# Patient Record
Sex: Male | Born: 1945 | Race: Black or African American | Hispanic: No | Marital: Married | Smoking: Former smoker
Health system: Southern US, Community
[De-identification: ages and names within clinical notes are randomized; demographics above are authoritative.]

## PROBLEM LIST (undated history)

## (undated) DIAGNOSIS — K56609 Unspecified intestinal obstruction, unspecified as to partial versus complete obstruction: Secondary | ICD-10-CM

## (undated) DIAGNOSIS — I5022 Chronic systolic (congestive) heart failure: Secondary | ICD-10-CM

## (undated) DIAGNOSIS — E119 Type 2 diabetes mellitus without complications: Secondary | ICD-10-CM

## (undated) DIAGNOSIS — I10 Essential (primary) hypertension: Secondary | ICD-10-CM

## (undated) DIAGNOSIS — I639 Cerebral infarction, unspecified: Secondary | ICD-10-CM

## (undated) DIAGNOSIS — J189 Pneumonia, unspecified organism: Secondary | ICD-10-CM

## (undated) DIAGNOSIS — I69320 Aphasia following cerebral infarction: Secondary | ICD-10-CM

## (undated) DIAGNOSIS — G8190 Hemiplegia, unspecified affecting unspecified side: Secondary | ICD-10-CM

## (undated) DIAGNOSIS — I739 Peripheral vascular disease, unspecified: Secondary | ICD-10-CM

## (undated) HISTORY — PX: PERIPHERAL VASCULAR CATHETERIZATION: SHX172C

---

## 2015-11-11 DIAGNOSIS — K56609 Unspecified intestinal obstruction, unspecified as to partial versus complete obstruction: Secondary | ICD-10-CM

## 2015-11-11 HISTORY — PX: ABDOMINAL ADHESION SURGERY: SHX90

## 2015-11-11 HISTORY — DX: Unspecified intestinal obstruction, unspecified as to partial versus complete obstruction: K56.609

## 2016-10-28 ENCOUNTER — Other Ambulatory Visit (HOSPITAL_COMMUNITY): Payer: Self-pay | Admitting: Internal Medicine

## 2016-10-28 DIAGNOSIS — R1319 Other dysphagia: Secondary | ICD-10-CM

## 2016-11-01 ENCOUNTER — Ambulatory Visit (HOSPITAL_COMMUNITY)
Admission: RE | Admit: 2016-11-01 | Discharge: 2016-11-01 | Disposition: A | Discharge status: Home / Self Care | Payer: Medicare Other | Source: Ambulatory Visit | Attending: Internal Medicine | Admitting: Internal Medicine

## 2016-11-01 DIAGNOSIS — R1312 Dysphagia, oropharyngeal phase: Secondary | ICD-10-CM | POA: Insufficient documentation

## 2016-11-01 DIAGNOSIS — R1319 Other dysphagia: Secondary | ICD-10-CM | POA: Diagnosis present

## 2017-03-22 ENCOUNTER — Emergency Department (HOSPITAL_COMMUNITY): Payer: Medicare Other

## 2017-03-22 ENCOUNTER — Encounter (HOSPITAL_COMMUNITY): Payer: Self-pay

## 2017-03-22 ENCOUNTER — Inpatient Hospital Stay (HOSPITAL_COMMUNITY)
Admission: EM | Admit: 2017-03-22 | Discharge: 2017-03-28 | DRG: 291 | Disposition: A | Discharge status: Skilled Nursing Facility | Payer: Medicare Other | Attending: Internal Medicine | Admitting: Internal Medicine

## 2017-03-22 ENCOUNTER — Other Ambulatory Visit: Payer: Self-pay

## 2017-03-22 ENCOUNTER — Inpatient Hospital Stay (HOSPITAL_COMMUNITY): Payer: Medicare Other

## 2017-03-22 DIAGNOSIS — Z515 Encounter for palliative care: Secondary | ICD-10-CM | POA: Diagnosis not present

## 2017-03-22 DIAGNOSIS — Z7982 Long term (current) use of aspirin: Secondary | ICD-10-CM

## 2017-03-22 DIAGNOSIS — Z931 Gastrostomy status: Secondary | ICD-10-CM | POA: Diagnosis not present

## 2017-03-22 DIAGNOSIS — J9601 Acute respiratory failure with hypoxia: Secondary | ICD-10-CM | POA: Diagnosis not present

## 2017-03-22 DIAGNOSIS — E876 Hypokalemia: Secondary | ICD-10-CM | POA: Diagnosis present

## 2017-03-22 DIAGNOSIS — A419 Sepsis, unspecified organism: Secondary | ICD-10-CM

## 2017-03-22 DIAGNOSIS — E1151 Type 2 diabetes mellitus with diabetic peripheral angiopathy without gangrene: Secondary | ICD-10-CM | POA: Diagnosis present

## 2017-03-22 DIAGNOSIS — R05 Cough: Secondary | ICD-10-CM

## 2017-03-22 DIAGNOSIS — I69359 Hemiplegia and hemiparesis following cerebral infarction affecting unspecified side: Secondary | ICD-10-CM | POA: Diagnosis not present

## 2017-03-22 DIAGNOSIS — K219 Gastro-esophageal reflux disease without esophagitis: Secondary | ICD-10-CM | POA: Diagnosis present

## 2017-03-22 DIAGNOSIS — I472 Ventricular tachycardia: Secondary | ICD-10-CM | POA: Diagnosis not present

## 2017-03-22 DIAGNOSIS — I5043 Acute on chronic combined systolic (congestive) and diastolic (congestive) heart failure: Secondary | ICD-10-CM | POA: Diagnosis present

## 2017-03-22 DIAGNOSIS — I480 Paroxysmal atrial fibrillation: Secondary | ICD-10-CM | POA: Diagnosis present

## 2017-03-22 DIAGNOSIS — Z7189 Other specified counseling: Secondary | ICD-10-CM | POA: Diagnosis not present

## 2017-03-22 DIAGNOSIS — Z79899 Other long term (current) drug therapy: Secondary | ICD-10-CM | POA: Diagnosis not present

## 2017-03-22 DIAGNOSIS — R0602 Shortness of breath: Secondary | ICD-10-CM | POA: Diagnosis not present

## 2017-03-22 DIAGNOSIS — E872 Acidosis: Secondary | ICD-10-CM | POA: Diagnosis present

## 2017-03-22 DIAGNOSIS — I110 Hypertensive heart disease with heart failure: Secondary | ICD-10-CM | POA: Diagnosis not present

## 2017-03-22 DIAGNOSIS — Z9189 Other specified personal risk factors, not elsewhere classified: Secondary | ICD-10-CM | POA: Diagnosis not present

## 2017-03-22 DIAGNOSIS — J690 Pneumonitis due to inhalation of food and vomit: Secondary | ICD-10-CM | POA: Diagnosis present

## 2017-03-22 DIAGNOSIS — I509 Heart failure, unspecified: Secondary | ICD-10-CM

## 2017-03-22 DIAGNOSIS — N289 Disorder of kidney and ureter, unspecified: Secondary | ICD-10-CM

## 2017-03-22 DIAGNOSIS — Z66 Do not resuscitate: Secondary | ICD-10-CM | POA: Diagnosis present

## 2017-03-22 DIAGNOSIS — Z87891 Personal history of nicotine dependence: Secondary | ICD-10-CM

## 2017-03-22 DIAGNOSIS — N179 Acute kidney failure, unspecified: Secondary | ICD-10-CM | POA: Diagnosis present

## 2017-03-22 DIAGNOSIS — I5022 Chronic systolic (congestive) heart failure: Secondary | ICD-10-CM | POA: Diagnosis present

## 2017-03-22 DIAGNOSIS — R7881 Bacteremia: Secondary | ICD-10-CM | POA: Diagnosis present

## 2017-03-22 DIAGNOSIS — R059 Cough, unspecified: Secondary | ICD-10-CM

## 2017-03-22 DIAGNOSIS — R509 Fever, unspecified: Secondary | ICD-10-CM

## 2017-03-22 DIAGNOSIS — I5023 Acute on chronic systolic (congestive) heart failure: Secondary | ICD-10-CM | POA: Diagnosis not present

## 2017-03-22 DIAGNOSIS — I69320 Aphasia following cerebral infarction: Secondary | ICD-10-CM

## 2017-03-22 DIAGNOSIS — I340 Nonrheumatic mitral (valve) insufficiency: Secondary | ICD-10-CM | POA: Diagnosis not present

## 2017-03-22 DIAGNOSIS — Z8701 Personal history of pneumonia (recurrent): Secondary | ICD-10-CM

## 2017-03-22 DIAGNOSIS — B9562 Methicillin resistant Staphylococcus aureus infection as the cause of diseases classified elsewhere: Secondary | ICD-10-CM | POA: Diagnosis present

## 2017-03-22 HISTORY — DX: Unspecified intestinal obstruction, unspecified as to partial versus complete obstruction: K56.609

## 2017-03-22 HISTORY — DX: Essential (primary) hypertension: I10

## 2017-03-22 HISTORY — DX: Hemiplegia, unspecified affecting unspecified side: G81.90

## 2017-03-22 HISTORY — DX: Pneumonia, unspecified organism: J18.9

## 2017-03-22 HISTORY — DX: Chronic systolic (congestive) heart failure: I50.22

## 2017-03-22 HISTORY — DX: Peripheral vascular disease, unspecified: I73.9

## 2017-03-22 HISTORY — DX: Type 2 diabetes mellitus without complications: E11.9

## 2017-03-22 HISTORY — DX: Aphasia following cerebral infarction: I69.320

## 2017-03-22 LAB — CBC WITH DIFFERENTIAL/PLATELET
BASOS PCT: 0 %
Basophils Absolute: 0 10*3/uL (ref 0.0–0.1)
EOS PCT: 1 %
Eosinophils Absolute: 0.1 10*3/uL (ref 0.0–0.7)
HEMATOCRIT: 42.3 % (ref 39.0–52.0)
Hemoglobin: 13.7 g/dL (ref 13.0–17.0)
Lymphocytes Relative: 26 %
Lymphs Abs: 2.1 10*3/uL (ref 0.7–4.0)
MCH: 24.8 pg — ABNORMAL LOW (ref 26.0–34.0)
MCHC: 32.4 g/dL (ref 30.0–36.0)
MCV: 76.6 fL — AB (ref 78.0–100.0)
MONO ABS: 0.7 10*3/uL (ref 0.1–1.0)
MONOS PCT: 9 %
NEUTROS ABS: 5.1 10*3/uL (ref 1.7–7.7)
Neutrophils Relative %: 64 %
Platelets: 432 10*3/uL — ABNORMAL HIGH (ref 150–400)
RBC: 5.52 MIL/uL (ref 4.22–5.81)
RDW: 16.4 % — AB (ref 11.5–15.5)
WBC: 7.9 10*3/uL (ref 4.0–10.5)

## 2017-03-22 LAB — COMPREHENSIVE METABOLIC PANEL
ALBUMIN: 3 g/dL — AB (ref 3.5–5.0)
ALK PHOS: 113 U/L (ref 38–126)
ALT: 15 U/L — ABNORMAL LOW (ref 17–63)
AST: 21 U/L (ref 15–41)
Anion gap: 14 (ref 5–15)
BILIRUBIN TOTAL: 0.9 mg/dL (ref 0.3–1.2)
BUN: 9 mg/dL (ref 6–20)
CO2: 20 mmol/L — ABNORMAL LOW (ref 22–32)
Calcium: 10.1 mg/dL (ref 8.9–10.3)
Chloride: 107 mmol/L (ref 101–111)
Creatinine, Ser: 1.23 mg/dL (ref 0.61–1.24)
GFR calc Af Amer: >60 mL/min (ref >60)
GFR calc non Af Amer: 58 mL/min — ABNORMAL LOW (ref >60)
GLUCOSE: 124 mg/dL — AB (ref 65–99)
POTASSIUM: 3.9 mmol/L (ref 3.5–5.1)
Sodium: 141 mmol/L (ref 135–145)
TOTAL PROTEIN: 6.6 g/dL (ref 6.5–8.1)

## 2017-03-22 LAB — LACTIC ACID, PLASMA
LACTIC ACID, VENOUS: 3.7 mmol/L — AB (ref 0.5–1.9)
LACTIC ACID, VENOUS: 3.4 mmol/L — AB (ref 0.5–1.9)
LACTIC ACID, VENOUS: 2.6 mmol/L — AB (ref 0.5–1.9)
Lactic Acid, Venous: 2.2 mmol/L (ref 0.5–1.9)
Lactic Acid, Venous: 3.7 mmol/L (ref 0.5–1.9)
Lactic Acid, Venous: 1.9 mmol/L (ref 0.5–1.9)

## 2017-03-22 LAB — URINALYSIS, ROUTINE W REFLEX MICROSCOPIC
BILIRUBIN URINE: NEGATIVE
Bacteria, UA: NONE SEEN
Glucose, UA: NEGATIVE mg/dL
HGB URINE DIPSTICK: NEGATIVE
KETONES UR: 5 mg/dL — AB
Leukocytes, UA: NEGATIVE
Nitrite: NEGATIVE
PROTEIN: 100 mg/dL — AB
SQUAMOUS EPITHELIAL / LPF: NONE SEEN
Specific Gravity, Urine: 1.014 (ref 1.005–1.030)
pH: 6 (ref 5.0–8.0)

## 2017-03-22 LAB — INFLUENZA PANEL BY PCR (TYPE A & B)
INFLAPCR: NEGATIVE
Influenza B By PCR: NEGATIVE

## 2017-03-22 LAB — I-STAT TROPONIN, ED: TROPONIN I, POC: 0.03 ng/mL (ref 0.00–0.08)

## 2017-03-22 LAB — STREP PNEUMONIAE URINARY ANTIGEN: Strep Pneumo Urinary Antigen: NEGATIVE

## 2017-03-22 LAB — BRAIN NATRIURETIC PEPTIDE: B Natriuretic Peptide: 1180.9 pg/mL — ABNORMAL HIGH (ref 0.0–100.0)

## 2017-03-22 LAB — I-STAT CG4 LACTIC ACID, ED
Lactic Acid, Venous: 2.34 mmol/L (ref 0.5–1.9)
Lactic Acid, Venous: 2.33 mmol/L (ref 0.5–1.9)

## 2017-03-22 LAB — MRSA PCR SCREENING: MRSA BY PCR: POSITIVE — AB

## 2017-03-22 MED ORDER — CALCIUM CARBONATE-VITAMIN D 500-200 MG-UNIT PO TABS
1.0000 | ORAL_TABLET | Freq: Every day | ORAL | Status: DC
  Administered 2017-03-22 – 2017-03-28 (×7): 1
  Filled 2017-03-22 (×8): qty 1

## 2017-03-22 MED ORDER — ORAL CARE MOUTH RINSE
15.0000 mL | Freq: Two times a day (BID) | OROMUCOSAL | Status: DC
  Administered 2017-03-22 – 2017-03-27 (×12): 15 mL via OROMUCOSAL

## 2017-03-22 MED ORDER — METOPROLOL TARTRATE 12.5 MG HALF TABLET
12.5000 mg | ORAL_TABLET | Freq: Two times a day (BID) | ORAL | Status: DC
  Administered 2017-03-22 – 2017-03-27 (×10): 12.5 mg
  Filled 2017-03-22 (×11): qty 1

## 2017-03-22 MED ORDER — VANCOMYCIN HCL IN DEXTROSE 1-5 GM/200ML-% IV SOLN
1000.0000 mg | Freq: Once | INTRAVENOUS | Status: DC
  Filled 2017-03-22: qty 200

## 2017-03-22 MED ORDER — ENOXAPARIN SODIUM 40 MG/0.4ML ~~LOC~~ SOLN
40.0000 mg | SUBCUTANEOUS | Status: DC
  Administered 2017-03-22 – 2017-03-27 (×6): 40 mg via SUBCUTANEOUS
  Filled 2017-03-22 (×7): qty 0.4

## 2017-03-22 MED ORDER — ALPRAZOLAM 0.25 MG PO TABS: 0.2500 mg | ORAL_TABLET | Freq: Two times a day (BID) | ORAL | Status: DC | PRN

## 2017-03-22 MED ORDER — ACETAMINOPHEN 325 MG PO TABS
650.0000 mg | ORAL_TABLET | ORAL | Status: DC | PRN
Start: 2017-03-22 — End: 2017-03-28

## 2017-03-22 MED ORDER — SODIUM CHLORIDE 0.9 % IV BOLUS (SEPSIS)
1000.0000 mL | Freq: Once | INTRAVENOUS | Status: AC
  Administered 2017-03-22: 1000 mL via INTRAVENOUS

## 2017-03-22 MED ORDER — MAGNESIUM OXIDE 400 (241.3 MG) MG PO TABS
400.0000 mg | ORAL_TABLET | Freq: Two times a day (BID) | ORAL | Status: DC
  Administered 2017-03-22 – 2017-03-28 (×13): 400 mg
  Filled 2017-03-22 (×13): qty 1

## 2017-03-22 MED ORDER — SODIUM CHLORIDE 0.9 % IV SOLN
2.0000 g | Freq: Once | INTRAVENOUS | Status: AC
  Administered 2017-03-22: 2 g via INTRAVENOUS
  Filled 2017-03-22: qty 2

## 2017-03-22 MED ORDER — MUSCLE RUB 10-15 % EX CREA
TOPICAL_CREAM | CUTANEOUS | Status: DC | PRN
  Filled 2017-03-22: qty 85

## 2017-03-22 MED ORDER — SODIUM CHLORIDE 0.9 % IV SOLN: 250.0000 mL | INTRAVENOUS | Status: DC | PRN

## 2017-03-22 MED ORDER — MENTHOL (TOPICAL ANALGESIC) 4 % EX GEL: Freq: Two times a day (BID) | CUTANEOUS | Status: DC

## 2017-03-22 MED ORDER — ZOLPIDEM TARTRATE 5 MG PO TABS: 5.0000 mg | ORAL_TABLET | Freq: Every evening | ORAL | Status: DC | PRN

## 2017-03-22 MED ORDER — VANCOMYCIN HCL IN DEXTROSE 750-5 MG/150ML-% IV SOLN
750.0000 mg | Freq: Two times a day (BID) | INTRAVENOUS | Status: DC
  Administered 2017-03-23 (×2): 750 mg via INTRAVENOUS
  Filled 2017-03-22 (×3): qty 150

## 2017-03-22 MED ORDER — CEFEPIME HCL 1 G IJ SOLR
1.0000 g | Freq: Three times a day (TID) | INTRAMUSCULAR | Status: DC
  Filled 2017-03-22: qty 1

## 2017-03-22 MED ORDER — ONDANSETRON HCL 4 MG/2ML IJ SOLN: 4.0000 mg | Freq: Four times a day (QID) | INTRAMUSCULAR | Status: DC | PRN

## 2017-03-22 MED ORDER — ALBUTEROL SULFATE (2.5 MG/3ML) 0.083% IN NEBU
5.0000 mg | INHALATION_SOLUTION | Freq: Once | RESPIRATORY_TRACT | Status: AC
Start: 2017-03-22 — End: 2017-03-22
  Administered 2017-03-22: 5 mg via RESPIRATORY_TRACT
  Filled 2017-03-22: qty 6

## 2017-03-22 MED ORDER — CHLORHEXIDINE GLUCONATE CLOTH 2 % EX PADS
6.0000 | MEDICATED_PAD | Freq: Every day | CUTANEOUS | Status: AC
  Administered 2017-03-23 – 2017-03-27 (×5): 6 via TOPICAL

## 2017-03-22 MED ORDER — SODIUM CHLORIDE 0.9 % IV BOLUS (SEPSIS)
250.0000 mL | Freq: Once | INTRAVENOUS | Status: AC
  Administered 2017-03-22: 250 mL via INTRAVENOUS

## 2017-03-22 MED ORDER — VANCOMYCIN HCL IN DEXTROSE 750-5 MG/150ML-% IV SOLN: 750.0000 mg | Freq: Two times a day (BID) | INTRAVENOUS | Status: DC

## 2017-03-22 MED ORDER — ADULT MULTIVITAMIN W/MINERALS CH
1.0000 | ORAL_TABLET | Freq: Every day | ORAL | Status: DC
  Administered 2017-03-22 – 2017-03-28 (×7): 1
  Filled 2017-03-22 (×7): qty 1

## 2017-03-22 MED ORDER — SODIUM CHLORIDE 0.9 % IV BOLUS (SEPSIS): 1000.0000 mL | Freq: Once | INTRAVENOUS | Status: DC

## 2017-03-22 MED ORDER — SODIUM CHLORIDE 0.9% FLUSH
3.0000 mL | Freq: Two times a day (BID) | INTRAVENOUS | Status: DC
  Administered 2017-03-22 – 2017-03-27 (×9): 3 mL via INTRAVENOUS

## 2017-03-22 MED ORDER — FUROSEMIDE 10 MG/ML IJ SOLN: 40.0000 mg | Freq: Every day | INTRAMUSCULAR | Status: DC

## 2017-03-22 MED ORDER — VANCOMYCIN HCL 10 G IV SOLR
1500.0000 mg | Freq: Once | INTRAVENOUS | Status: AC
  Administered 2017-03-22: 1500 mg via INTRAVENOUS
  Filled 2017-03-22: qty 1500

## 2017-03-22 MED ORDER — VITAMIN C 500 MG PO TABS
500.0000 mg | ORAL_TABLET | Freq: Every day | ORAL | Status: DC
  Administered 2017-03-22 – 2017-03-28 (×7): 500 mg via ORAL
  Filled 2017-03-22 (×7): qty 1

## 2017-03-22 MED ORDER — MUPIROCIN 2 % EX OINT
1.0000 "application " | TOPICAL_OINTMENT | Freq: Two times a day (BID) | CUTANEOUS | Status: AC
  Administered 2017-03-22 – 2017-03-27 (×9): 1 via NASAL
  Filled 2017-03-22 (×2): qty 22

## 2017-03-22 MED ORDER — ATORVASTATIN CALCIUM 40 MG PO TABS
40.0000 mg | ORAL_TABLET | Freq: Every day | ORAL | Status: DC
  Administered 2017-03-22 – 2017-03-27 (×6): 40 mg
  Filled 2017-03-22 (×7): qty 1

## 2017-03-22 MED ORDER — LOSARTAN POTASSIUM 25 MG PO TABS: 25.0000 mg | ORAL_TABLET | Freq: Every day | ORAL | Status: DC

## 2017-03-22 MED ORDER — SODIUM CHLORIDE 0.9% FLUSH: 3.0000 mL | INTRAVENOUS | Status: DC | PRN

## 2017-03-22 MED ORDER — FAMOTIDINE 20 MG PO TABS
20.0000 mg | ORAL_TABLET | Freq: Every day | ORAL | Status: DC
  Administered 2017-03-22 – 2017-03-28 (×7): 20 mg
  Filled 2017-03-22 (×7): qty 1

## 2017-03-22 MED ORDER — GABAPENTIN 300 MG PO CAPS
300.0000 mg | ORAL_CAPSULE | Freq: Every day | ORAL | Status: DC
  Administered 2017-03-22 – 2017-03-27 (×6): 300 mg
  Filled 2017-03-22 (×6): qty 1

## 2017-03-22 MED ORDER — SODIUM CHLORIDE 0.9 % IV SOLN
3.0000 g | Freq: Three times a day (TID) | INTRAVENOUS | Status: DC
  Administered 2017-03-22 – 2017-03-28 (×19): 3 g via INTRAVENOUS
  Filled 2017-03-22 (×22): qty 3

## 2017-03-22 MED ORDER — AMIODARONE HCL 200 MG PO TABS
200.0000 mg | ORAL_TABLET | Freq: Every day | ORAL | Status: DC
  Administered 2017-03-22 – 2017-03-28 (×7): 200 mg
  Filled 2017-03-22 (×7): qty 1

## 2017-03-22 MED ORDER — FUROSEMIDE 10 MG/ML IJ SOLN
40.0000 mg | Freq: Two times a day (BID) | INTRAMUSCULAR | Status: DC
Start: 2017-03-22 — End: 2017-03-22
  Administered 2017-03-22: 40 mg via INTRAVENOUS
  Filled 2017-03-22: qty 4

## 2017-03-22 MED ORDER — ASPIRIN 81 MG PO CHEW
81.0000 mg | CHEWABLE_TABLET | Freq: Every day | ORAL | Status: DC
  Administered 2017-03-22 – 2017-03-28 (×7): 81 mg
  Filled 2017-03-22 (×7): qty 1

## 2017-03-22 NOTE — H&P (Signed)
History and Physical    [name] [name] CXK:[id number] DOB: [birth year] DOA: 03/22/2017  PCP: Garwin Brothers, MD   Patient coming from: SNF  Chief Complaint: SOB   HPI: [name] [name] is a 72 y.o. male with medical history significant for chronic systolic CHF, hypertension, paroxysmal atrial fibrillation, and history of CVA with hemiplegia, now presenting from his SNF for evaluation of respiratory distress.  Patient reports that he developed shortness of breath beginning 2 weeks ago and has progressively worsened since that time.  He reports an occasional cough, sometimes productive of scant thick sputum.  He reportedly had a fever at the nursing facility and was treated with Tylenol prior to transfer to the ED.  He denies chest pain or palpitations.  Upon EMS arrival, patient was reportedly saturating 86% on 4 L/min of supplemental oxygen.  He does not require supplemental oxygen at baseline.  ED Course: Upon arrival to the ED, patient is found to be afebrile, saturating mid 90s with 6 L/min of supplemental oxygen, and with vitals otherwise normal.  EKG features a sinus rhythm with PAC and nonspecific IVCD.  Chest x-ray is notable for moderate cardiomegaly with diffuse hazy opacities throughout bilateral lungs and medium pleural effusions consistent with CHF, but with pneumonia difficult to exclude.  Chemistry panel reveals a creatinine of 1.23, slightly up from priors.  CBC is notable for a stable chronic thrombocytosis with platelets 432,000.  Lactic acid is elevated to 2.34, troponin is within normal limits, and BNP is 1180.  Blood cultures were collected, 30 cc/kg normal saline was given as a bolus, and the patient was started on empiric vancomycin and cefepime.  Fluids and antibiotics were started prior to BNP and chest x-ray becoming available, and the fluid was stopped once these studies were reviewed.  Patient is in some continued respiratory distress hemodynamically stable, and will be admitted to  the stepdown unit for ongoing evaluation and management of acute hypoxic respiratory failure suspected secondary to acute on chronic systolic CHF.  Review of Systems:  All other systems reviewed and apart from HPI, are negative.  Past Medical History:  Diagnosis Date  . CVA, old, aphasia   . Hemiplegia ([place])   . Intestinal obstruction ([place])   . Peripheral vascular disease ([place])   . Pneumonia     Past Surgical History:  Procedure Laterality Date  . ABDOMINAL SURGERY       reports that he has quit smoking. He does not have any smokeless tobacco history on file. He reports that he does not drink alcohol or use drugs.  No Known Allergies  History reviewed. No pertinent family history.   Prior to Admission medications   Medication Sig Start Date End Date Taking? Authorizing Provider  acetaminophen (TYLENOL) 325 MG tablet Place 650 mg into feeding tube every 6 (six) hours as needed for mild pain.   Yes [provider]  amiodarone (PACERONE) 200 MG tablet Place 200 mg into feeding tube daily.   Yes [provider]  aspirin 81 MG chewable tablet Place 81 mg into feeding tube daily.   Yes [provider]  atorvastatin (LIPITOR) 40 MG tablet Place 40 mg into feeding tube daily.   Yes [provider]  calcium-vitamin D (OSCAL WITH D) 500-200 MG-UNIT tablet Place 1 tablet into feeding tube daily with breakfast.   Yes [provider]  famotidine (PEPCID) 20 MG tablet Place 20 mg into feeding tube daily.   Yes [provider]  ferrous sulfate 300 (60 Fe)  MG/5ML syrup Place 300 mg into feeding tube 2 (two) times daily with a meal.   Yes [provider]  furosemide (LASIX) 20 MG tablet Place 20 mg into feeding tube daily.   Yes [provider]  gabapentin (NEURONTIN) 300 MG capsule Place 300 mg into feeding tube at bedtime.   Yes [provider]  losartan (COZAAR) 25 MG tablet Place 25 mg into feeding tube daily.  Hold if SBP <120, or DBP <70   Yes [provider]  magnesium oxide (MAG-OX) 400 MG tablet Place 400 mg into feeding tube 2 (two) times daily.   Yes [provider]  Menthol, Topical Analgesic, (BIOFREEZE) 4 % GEL Apply topically 2 (two) times daily. To left knee   Yes [provider]  metoprolol tartrate (LOPRESSOR) 25 MG tablet Place 12.5 mg into feeding tube 2 (two) times daily.   Yes [provider]  Multiple Vitamin (MULTIVITAMIN WITH MINERALS) TABS tablet Place 1 tablet into feeding tube daily.   Yes [provider]  vitamin C (ASCORBIC ACID) 500 MG tablet Take 500 mg by mouth daily.   Yes [provider]  Vitamin D, Ergocalciferol, (DRISDOL) 50000 units CAPS capsule Take 50,000 Units by mouth every 30 (thirty) days.   Yes [provider]    Physical Exam: Vitals:   03/22/17 0236 03/22/17 0330 03/22/17 0430 03/22/17 0434  BP:  131/87 (!) 147/76   Pulse:  81 86   Resp:  17 (!) 27   Temp:    97.6 F (36.4 C)  TempSrc:    Oral  SpO2:  96% 94%   Weight: 84.8 kg (187 lb)     Height: 5\' 11"  (1.803 m)         Constitutional: Tachypneic, dyspneic with speech, no pallor   Eyes: PERTLA, lids and conjunctivae normal ENMT: Mucous membranes are moist. Posterior pharynx clear of any exudate or lesions.   Neck: normal, supple, no masses, no thyromegaly Respiratory: Tachypneic, dyspneic with speech, accessory muscle recruitment, diffuse rales. No pallor or cyanosis.  Cardiovascular: S1 & S2 heard, regular rate and rhythm. No extremity edema.  Abdomen: No distension, no tenderness, no masses palpated. Bowel sounds normal.  Musculoskeletal: no clubbing / cyanosis. No joint deformity upper and lower extremities.   Skin: no significant rashes, lesions, ulcers. Warm, dry, well-perfused. Neurologic: Dysarthric. Right-sided hemiplegia.   Psychiatric:  Alert and oriented x 3. Pleasant and cooperative.     Labs on Admission: I have  personally reviewed following labs and imaging studies  CBC: Recent Labs  Lab 03/22/17 0252  WBC 7.9  NEUTROABS 5.1  HGB 13.7  HCT 42.3  MCV 76.6*  PLT 831*   Basic Metabolic Panel: Recent Labs  Lab 03/22/17 0252  NA 141  K 3.9  CL 107  CO2 20*  GLUCOSE 124*  BUN 9  CREATININE 1.23  CALCIUM 10.1   GFR: Estimated Creatinine Clearance: 59.5 mL/min (by C-G formula based on SCr of 1.23 mg/dL). Liver Function Tests: Recent Labs  Lab 03/22/17 0252  AST 21  ALT 15*  ALKPHOS 113  BILITOT 0.9  PROT 6.6  ALBUMIN 3.0*   No results for input(s): LIPASE, AMYLASE in the last 168 hours. No results for input(s): AMMONIA in the last 168 hours. Coagulation Profile: No results for input(s): INR, PROTIME in the last 168 hours. Cardiac Enzymes: No results for input(s): CKTOTAL, CKMB, CKMBINDEX, TROPONINI in the last 168 hours. BNP (last 3 results) No results for input(s): PROBNP in  the last 8760 hours. HbA1C: No results for input(s): HGBA1C in the last 72 hours. CBG: No results for input(s): GLUCAP in the last 168 hours. Lipid Profile: No results for input(s): CHOL, HDL, LDLCALC, TRIG, CHOLHDL, LDLDIRECT in the last 72 hours. Thyroid Function Tests: No results for input(s): TSH, T4TOTAL, FREET4, T3FREE, THYROIDAB in the last 72 hours. Anemia Panel: No results for input(s): VITAMINB12, FOLATE, FERRITIN, TIBC, IRON, RETICCTPCT in the last 72 hours. Urine analysis:    Component Value Date/Time   COLORURINE YELLOW 03/22/2017 0410   APPEARANCEUR CLEAR 03/22/2017 0410   LABSPEC 1.014 03/22/2017 0410   PHURINE 6.0 03/22/2017 0410   GLUCOSEU NEGATIVE 03/22/2017 0410   HGBUR NEGATIVE 03/22/2017 0410   BILIRUBINUR NEGATIVE 03/22/2017 0410   KETONESUR 5 (A) 03/22/2017 0410   PROTEINUR 100 (A) 03/22/2017 0410   NITRITE NEGATIVE 03/22/2017 0410   LEUKOCYTESUR NEGATIVE 03/22/2017 0410   Sepsis Labs: @LABRCNTIP (procalcitonin:4,lacticidven:4) )No results found for this or any  previous visit (from the past 240 hour(s)).   Radiological Exams on Admission: Dg Chest 1 View  Result Date: 03/22/2017 CLINICAL DATA:  Shortness of breath EXAM: CHEST  1 VIEW COMPARISON:  Chest radiograph 12/20/2015 FINDINGS: Bilateral pleural effusions with lateral components. Moderate cardiomegaly. Diffuse hazy opacity throughout both lungs. IMPRESSION: Moderate cardiomegaly with diffuse hazy opacities throughout both lungs and medium-sized bilateral pleural effusions. This may indicate congestive heart failure. Pneumonia would be difficult to exclude. Electronically Signed   By: Ulyses Jarred M.D.   On: 03/22/2017 03:26    EKG: Independently reviewed. Sinus rhythm, PAC, non-specific IVCD.   Assessment/Plan  1. Acute hypoxic respiratory failure  - Presents from SNF with 2 weeks of progressive SOB and cough occasionally productive of scant sputum  - Reportedly had fever at SNF, afebrile here  - No leukocytosis, lactate 2.34, BNP 1180, CXR findings most-consistent with pulmonary edema but difficult to exclude PNA  - He was started down sepsis pathway with 30 cc/kg NS and broad spectrum abx prior to CXR and BNP  - Suspect this is primarily acute CHF, but with reported fever there may be an infectious process as well  - Blood cultures have been collected, will add sputum culture and check procalcitonin - Has received doses of vancomycin and cefepime in ED, will hold further abx for now while following cultures and pending procalcitonin - Treat suspected acute CHF as below   2. Acute on chronic systolic CHF  - Presents with acute respiratory distress, found to have diffuse rales, BNP 1180, and CXR findings consistent with pulmonary edema  - Echo from December 2017 with EF 40-45% and global HK  - Diurese with Lasix 40 mg IV q12h, continue ARB and beta-blocker  - Continue cardiac monitoring, SLIV, follow daily wts and I/O's  - Update echocardiogram, follow daily chem panel during diuresis    3. Hx of CVA with hemiparesis  - Has chronic dysarthria and right-sided weakness, denies acute deficit  - Continue ASA    4. Paroxysmal atrial fibrillation   - In a sinus rhythm on admission  - CHADS-VASc (age, HTN, CHF)  - He is not anticoagulated  - Continue amiodarone    5. Mild renal insufficiency  - SCr is 1.23 on admission, slightly higher than priors  - Renally-dose medications, follow daily chem panel during diuresis   DVT prophylaxis: Lovenox Code Status: DNR Family Communication: Discussed with patient Consults called: None Admission status: Inpatient    Vianne Bulls, MD [facility] Pager [phone]  If  7PM-7AM, please contact night-coverage [email or web address] Password [facility]  03/22/2017, 4:52 AM

## 2017-03-22 NOTE — Progress Notes (Signed)
Pharmacy Antibiotic Note  [name] [name] is a 72 y.o. male admitted on 03/22/2017 with pneumonia.  Pharmacy has been consulted for Vancomycin/Cefepime dosing. WBC WNL. CrCl 55-60. Lactic acid elevated. Afebrile.   Plan: Vancomycin 750 mg IV q12h Cefepime 1g IV q8h Trend WBC, temp, renal function  F/U infectious work-up Drug levels as indicated   Height: 5\' 11"  (180.3 cm) Weight: 187 lb (84.8 kg) IBW/kg (Calculated) : 75.3  Temp (24hrs), Avg:97.6 F (36.4 C), Min:97.6 F (36.4 C), Max:97.6 F (36.4 C)  Recent Labs  Lab 03/22/17 0252 03/22/17 0256 03/22/17 0429  WBC 7.9  --   --   CREATININE 1.23  --   --   LATICACIDVEN  --  2.34* 2.33*    Estimated Creatinine Clearance: 59.5 mL/min (by C-G formula based on SCr of 1.23 mg/dL).    No Known Allergies  [name] [name], [name] [name] 03/22/2017 4:35 AM

## 2017-03-22 NOTE — Progress Notes (Signed)
Pharmacy Antibiotic Note  [name] [name] is a 72 y.o. male admitted on 03/22/2017 with pneumonia, possible aspiration.  Patient received cefepime this AM and not vancomycin since it was discontinued.  Pharmacy now consulted to restart vancomycin.  He is also on Unasyn.   SCr 1.23, CrCL 60 ml/min, unsure of baseline.  Afebrile, WBC WNL, LA 3.7.   Plan: Vanc 1500mg  IV x 1, then 750mg  IV Q12H Unasyn 3gm IV Q8H per MD Monitor renal fxn, clinical progress, vanc trough if indicated    Height: 5\' 11"  (180.3 cm) Weight: 183 lb 8 oz (83.2 kg) IBW/kg (Calculated) : 75.3  Temp (24hrs), Avg:97.6 F (36.4 C), Min:97.6 F (36.4 C), Max:97.6 F (36.4 C)  Recent Labs  Lab 03/22/17 0252  03/22/17 0429 03/22/17 0500 03/22/17 0914 03/22/17 1044 03/22/17 1336  WBC 7.9  --   --   --   --   --   --   CREATININE 1.23  --   --   --   --   --   --   LATICACIDVEN  --    < > 2.33* 2.2* 3.7* 3.4* 3.7*   < > = values in this interval not displayed.    Estimated Creatinine Clearance: 59.5 mL/min (by C-G formula based on SCr of 1.23 mg/dL).    No Known Allergies    Vanc 3/13 >> Unasyn 3/13 >> Cefepime 3/13 >> 3/13   3/13 MRSA PCR - positive 3/13 Strep pneumo Ur Ag - negative 3/13 Flu - negative 3/13 BCx -    Tyeson Tanimoto D. Mina Marble, PharmD, BCPS Pager:  [phone] 03/22/2017, 6:06 PM

## 2017-03-22 NOTE — CV Procedure (Signed)
Attempted 2D Echo at [facility], patient with other staff, will try again at a later time.  [name] [name]

## 2017-03-22 NOTE — ED Provider Notes (Addendum)
[facility] EMERGENCY DEPARTMENT Provider Note   CSN: [id number] Arrival date & time: 03/22/17  0217     History   Chief Complaint Chief Complaint  Patient presents with  . Shortness of Breath    HPI [name] [name] is a 72 y.o. male.  The history is provided by the patient and the EMS personnel.  He has history of stroke with hemiplegia, peripheral vascular disease, recurrent pneumonia and comes in because of dyspnea and hypoxia.  He states that he has been having some difficulty breathing for the last several weeks and cough productive of some light colored sputum.  He was not aware of fever, but nursing home does report fever at home and he was medicated with acetaminophen.  He also was noted to be hypoxic today.  He normally does not require oxygen, but EMS reports oxygen saturation of 84% on 2 L oxygen by nasal cannula.  Oxygen levels improved when he was increased to 4 L oxygen via nasal cannula cannula.  He was also given a nebulizer treatment with improvement in oxygenation.  He denies any chest pain.  He was noted to be diaphoretic on arrival.  Past Medical History:  Diagnosis Date  . CVA, old, aphasia   . Hemiplegia ([place])   . Intestinal obstruction ([place])   . Peripheral vascular disease ([place])   . Pneumonia     There are no active problems to display for this patient.   Past Surgical History:  Procedure Laterality Date  . ABDOMINAL SURGERY         Home Medications    Prior to Admission medications   Not on File    Family History No family history on file.  Social History Social History   Tobacco Use  . Smoking status: Former Smoker  Substance Use Topics  . Alcohol use: No    Frequency: Never  . Drug use: No     Allergies   Patient has no known allergies.   Review of Systems Review of Systems  All other systems reviewed and are negative.    Physical Exam Updated Vital Signs BP 131/87   Pulse 81   Resp 17   Ht 5\' 11"   (1.803 m)   Wt 84.8 kg (187 lb)   SpO2 96%   BMI 26.08 kg/m   Physical Exam  Nursing note and vitals reviewed.  72 year old male, resting comfortably and in no acute distress. Vital signs are normal. Oxygen saturation is 96%, which is normal. Head is normocephalic and atraumatic. PERRLA, EOMI. Oropharynx is clear. Neck is nontender and supple without adenopathy or JVD. Back is nontender and there is no CVA tenderness. Lungs have left basilar rales without wheezes or rhonchi. Chest is nontender. Heart has regular rate and rhythm without murmur. Abdomen is soft, flat, nontender without masses or hepatosplenomegaly and peristalsis is normoactive.  PEG tube present left upper quadrant. Extremities have no cyanosis or edema, full range of motion is present. Skin is warm and dry without rash. Neurologic: Mental status is normal except for mild expressive aphasia, cranial nerves are intact.  Left hemiparesis noted.  ED Treatments / Results  Labs (all labs ordered are listed, but only abnormal results are displayed) Labs Reviewed  COMPREHENSIVE METABOLIC PANEL - Abnormal; Notable for the following components:      Result Value   CO2 20 (*)    Glucose, Bld 124 (*)    Albumin 3.0 (*)    ALT 15 (*)  GFR calc non Af Amer 58 (*)    All other components within normal limits  BRAIN NATRIURETIC PEPTIDE - Abnormal; Notable for the following components:   B Natriuretic Peptide 1,180.9 (*)    All other components within normal limits  CBC WITH DIFFERENTIAL/PLATELET - Abnormal; Notable for the following components:   MCV 76.6 (*)    MCH 24.8 (*)    RDW 16.4 (*)    Platelets 432 (*)    All other components within normal limits  I-STAT CG4 LACTIC ACID, ED - Abnormal; Notable for the following components:   Lactic Acid, Venous 2.34 (*)    All other components within normal limits  I-STAT CG4 LACTIC ACID, ED - Abnormal; Notable for the following components:   Lactic Acid, Venous 2.33 (*)     All other components within normal limits  CULTURE, BLOOD (ROUTINE X 2)  CULTURE, BLOOD (ROUTINE X 2)  URINE CULTURE  URINALYSIS, ROUTINE W REFLEX MICROSCOPIC  I-STAT TROPONIN, ED    EKG  EKG Interpretation  Date/Time:  Wednesday March 22 2017 04:35:53 EDT Ventricular Rate:  86 PR Interval:    QRS Duration: 127 QT Interval:  461 QTC Calculation: 552 R Axis:   35 Text Interpretation:  Sinus rhythm Atrial premature complexes Short PR interval Probable left atrial enlargement IVCD, consider atypical LBBB No old tracing to compare Confirmed by Delora Fuel ([id number]) on 03/22/2017 4:42:18 AM       Radiology Dg Chest 1 View  Result Date: 03/22/2017 CLINICAL DATA:  Shortness of breath EXAM: CHEST  1 VIEW COMPARISON:  Chest radiograph 12/20/2015 FINDINGS: Bilateral pleural effusions with lateral components. Moderate cardiomegaly. Diffuse hazy opacity throughout both lungs. IMPRESSION: Moderate cardiomegaly with diffuse hazy opacities throughout both lungs and medium-sized bilateral pleural effusions. This may indicate congestive heart failure. Pneumonia would be difficult to exclude. Electronically Signed   By: Ulyses Jarred M.D.   On: 03/22/2017 03:26    Procedures Procedures  CRITICAL CARE Performed by: Delora Fuel Total critical care time: 50 minutes Critical care time was exclusive of separately billable procedures and treating other patients. Critical care was necessary to treat or prevent imminent or life-threatening deterioration. Critical care was time spent personally by me on the following activities: development of treatment plan with patient and/or surrogate as well as nursing, discussions with consultants, evaluation of patient's response to treatment, examination of patient, obtaining history from patient or surrogate, ordering and performing treatments and interventions, ordering and review of laboratory studies, ordering and review of radiographic studies, pulse oximetry and  re-evaluation of patient's condition.  Medications Ordered in ED Medications  sodium chloride 0.9 % bolus 1,000 mL (not administered)    And  sodium chloride 0.9 % bolus 1,000 mL (0 mLs Intravenous Stopped 03/22/17 0411)    And  sodium chloride 0.9 % bolus 1,000 mL (0 mLs Intravenous Stopped 03/22/17 0330)  ceFEPIme (MAXIPIME) 2 g in sodium chloride 0.9 % 100 mL IVPB (2 g Intravenous New Bag/Given 03/22/17 0410)  vancomycin (VANCOCIN) IVPB 1000 mg/200 mL premix (not administered)  albuterol (PROVENTIL) (2.5 MG/3ML) 0.083% nebulizer solution 5 mg (5 mg Nebulization Given 03/22/17 0230)     Initial Impression / Assessment and Plan / ED Course  I have reviewed the triage vital signs and the nursing notes.  Pertinent labs & imaging results that were available during my care of the patient were reviewed by me and considered in my medical decision making (see chart for details).  Fever with hypoxia strongly suggestive  of pneumonia.  Initial vital signs are reassuring.  However, lactic acid level has come back mildly elevated.  He started on early goal directed fluid treatment and antibiotics initiated for presumed healthcare associated pneumonia.  Chest x-ray is pending.  He has no prior records in the [facility] system.  Chest x-ray looks like pulmonary edema although could also represent pneumonia.  BNP is come back markedly elevated suggesting most of picture is heart failure.  This would not account for fever or productive cough.  Case is discussed with Dr. Myna Hidalgo of [facility] who agrees to admit the patient.  Final Clinical Impressions(s) / ED Diagnoses   Final diagnoses:  Sepsis, due to unspecified organism [facility])  Acute on chronic congestive heart failure, unspecified heart failure type [facility])    ED Discharge Orders    None       Delora Fuel, MD 27/74/12 [id number]    Delora Fuel, MD 76/72/09 [phone]

## 2017-03-22 NOTE — ED Triage Notes (Signed)
Patient states that he has been SOB x2 week. Arrived diaphoretic (clothes were soaked) and O2 sats @86 % on 4lpm (patient is not normally on oxygen.

## 2017-03-22 NOTE — Progress Notes (Addendum)
PROGRESS NOTE    [name] [name]  DGU:[id number] DOB: [birth year] DOA: 03/22/2017 PCP: Garwin Brothers, MD   Brief Narrative by Dr Myna Hidalgo: "[name] [name] is a 72 y.o. male with medical history significant for chronic systolic CHF, hypertension, paroxysmal atrial fibrillation, and history of CVA with hemiplegia, now presenting from his SNF for evaluation of respiratory distress.  Patient reports that he developed shortness of breath beginning 2 weeks ago and has progressively worsened since that time.  He reports an occasional cough, sometimes productive of scant thick sputum.  He reportedly had a fever at the nursing facility and was treated with Tylenol prior to transfer to the ED.  He denies chest pain or palpitations.  Upon EMS arrival, patient was reportedly saturating 86% on 4 L/min of supplemental oxygen.  He does not require supplemental oxygen at baseline".  "Upon arrival to the ED, patient is found to be afebrile, saturating mid 90s with 6 L/min of supplemental oxygen, and with vitals otherwise normal.  EKG features a sinus rhythm with PAC and nonspecific IVCD.  Chest x-ray is notable for moderate cardiomegaly with diffuse hazy opacities throughout bilateral lungs and medium pleural effusions consistent with CHF, but with pneumonia difficult to exclude.  Chemistry panel reveals a creatinine of 1.23, slightly up from priors.  CBC is notable for a stable chronic thrombocytosis with platelets 432,000.  Lactic acid is elevated to 2.34, troponin is within normal limits, and BNP is 1180".   Assessment & Plan:   Principal Problem:   Acute on chronic systolic CHF (congestive heart failure) (HCC) Active Problems:   Acute respiratory failure with hypoxia (HCC)   CVA, old, aphasia   Mild renal insufficiency   AF (paroxysmal atrial fibrillation) (HCC)   1-Acute hypoxic Respiratory Failure; Secondary to acute on chronic systolic HF exacerbation; ? Pneumonia.  Chest x ray; moderated cardiomegaly with  diffuse opacity, bilateral pleural effusion. Could be HF, pneumonia difficult to exclude.  Influenza negative, blood cultures; pending Weight; 187--- Prior weight 240 pd on 2017.   Strep pneumonia antigen and legionella antigen are pending.  Patient received a dose of IV lasix 40 mg. Will hold further doses of lasix due to increase lactic acid.  I will add IV Unasyn. He will need swallow evaluation to exclude aspiration Pneumonia.  Monitor respiratory status, if it get worse might need further IV lasix.  Repeat chest x ray in am.  Will check ECHO.  Strict I and O.   2-Lactic acidosis. Reported fever at facility./ chest x ray unable to exclude PNA.  Will start Unasyn to cover for aspiration PNA.  Speech swallow evaluation.  250 cc IV bolus ordered. Cycle lactic acid. Monitor respiratory status  Will add vancomycin , to cover for PNA.    Paroxysmal A fib.  EKG sinus rhythm on admission.  Continue with amiodarone.   History of CVA with hemiparesis;  continue with aspirin, lipitor.   Mild AKI; received fluids. Repeat labs in am.      DVT prophylaxis: Lovenox.  Code Status: DNR Family Communication: Care discussed with patient.  Disposition Plan: back to SNF when respiratory status improved.   Consultants:      Procedures:  ECHO---    Antimicrobials:   Unasyn 3-13.   Subjective: He is breathing better, report productive cough. Also report cough while coughing.    Objective: Vitals:   03/22/17 0430 03/22/17 0434 03/22/17 0600 03/22/17 0730  BP: (!) 147/76  (!) 145/77 129/69  Pulse: 86  85 82  Resp: Marland Kitchen)  27  (!) 21 (!) 22  Temp:  97.6 F (36.4 C)    TempSrc:  Oral    SpO2: 94%  93% 96%  Weight:      Height:        Intake/Output Summary (Last 24 hours) at 03/22/2017 0805 Last data filed at 03/22/2017 0440 Gross per 24 hour  Intake 600 ml  Output -  Net 600 ml   Filed Weights   03/22/17 0236  Weight: 84.8 kg (187 lb)    Examination:  General exam:  Appears calm and comfortable  Respiratory system:  Respiratory effort normal. Bilateral ronchus. Crackles bases.  Cardiovascular system: S1 & S2 heard, RRR. No JVD, murmurs, rubs, gallops or clicks. No pedal edema. Gastrointestinal system: Abdomen is nondistended, soft and nontender. No organomegaly or masses felt. Normal bowel sounds heard. Central nervous system: left side hemiparesis.  Extremities: Symmetric 5 x 5 power. Skin: No rashes, lesions or ulcers Psychiatry: Judgement and insight appear normal. Mood & affect appropriate.     Data Reviewed: I have personally reviewed following labs and imaging studies  CBC: Recent Labs  Lab 03/22/17 0252  WBC 7.9  NEUTROABS 5.1  HGB 13.7  HCT 42.3  MCV 76.6*  PLT 093*   Basic Metabolic Panel: Recent Labs  Lab 03/22/17 0252  NA 141  K 3.9  CL 107  CO2 20*  GLUCOSE 124*  BUN 9  CREATININE 1.23  CALCIUM 10.1   GFR: Estimated Creatinine Clearance: 59.5 mL/min (by C-G formula based on SCr of 1.23 mg/dL). Liver Function Tests: Recent Labs  Lab 03/22/17 0252  AST 21  ALT 15*  ALKPHOS 113  BILITOT 0.9  PROT 6.6  ALBUMIN 3.0*   No results for input(s): LIPASE, AMYLASE in the last 168 hours. No results for input(s): AMMONIA in the last 168 hours. Coagulation Profile: No results for input(s): INR, PROTIME in the last 168 hours. Cardiac Enzymes: No results for input(s): CKTOTAL, CKMB, CKMBINDEX, TROPONINI in the last 168 hours. BNP (last 3 results) No results for input(s): PROBNP in the last 8760 hours. HbA1C: No results for input(s): HGBA1C in the last 72 hours. CBG: No results for input(s): GLUCAP in the last 168 hours. Lipid Profile: No results for input(s): CHOL, HDL, LDLCALC, TRIG, CHOLHDL, LDLDIRECT in the last 72 hours. Thyroid Function Tests: No results for input(s): TSH, T4TOTAL, FREET4, T3FREE, THYROIDAB in the last 72 hours. Anemia Panel: No results for input(s): VITAMINB12, FOLATE, FERRITIN, TIBC, IRON,  RETICCTPCT in the last 72 hours. Sepsis Labs: Recent Labs  Lab 03/22/17 0256 03/22/17 0429 03/22/17 0500  LATICACIDVEN 2.34* 2.33* 2.2*    No results found for this or any previous visit (from the past 240 hour(s)).       Radiology Studies: Dg Chest 1 View  Result Date: 03/22/2017 CLINICAL DATA:  Shortness of breath EXAM: CHEST  1 VIEW COMPARISON:  Chest radiograph 12/20/2015 FINDINGS: Bilateral pleural effusions with lateral components. Moderate cardiomegaly. Diffuse hazy opacity throughout both lungs. IMPRESSION: Moderate cardiomegaly with diffuse hazy opacities throughout both lungs and medium-sized bilateral pleural effusions. This may indicate congestive heart failure. Pneumonia would be difficult to exclude. Electronically Signed   By: Ulyses Jarred M.D.   On: 03/22/2017 03:26        Scheduled Meds: . amiodarone  200 mg Per Tube Daily  . aspirin  81 mg Per Tube Daily  . atorvastatin  40 mg Per Tube q1800  . calcium-vitamin D  1 tablet Per Tube Q breakfast  .  enoxaparin (LOVENOX) injection  40 mg Subcutaneous Q24H  . famotidine  20 mg Per Tube Daily  . furosemide  40 mg Intravenous Q12H  . gabapentin  300 mg Per Tube QHS  . losartan  25 mg Per Tube Daily  . magnesium oxide  400 mg Per Tube BID  . Menthol (Topical Analgesic)   Topical BID  . metoprolol tartrate  12.5 mg Per Tube BID  . multivitamin with minerals  1 tablet Per Tube Daily  . sodium chloride flush  3 mL Intravenous Q12H  . vitamin C  500 mg Oral Daily   Continuous Infusions: . sodium chloride       LOS: 0 days    Time spent: 35 minutes.     Elmarie Shiley, MD [facility] Pager [phone]  If 7PM-7AM, please contact night-coverage [email or web address] Password [email or web address] 03/22/2017, 8:05 AM

## 2017-03-22 NOTE — ED Notes (Signed)
Dr. Tyrell Antonio paged via Amion to inform of lab data.

## 2017-03-23 ENCOUNTER — Inpatient Hospital Stay (HOSPITAL_COMMUNITY): Payer: Medicare Other

## 2017-03-23 LAB — BLOOD CULTURE ID PANEL (REFLEXED)
ACINETOBACTER BAUMANNII: NOT DETECTED
CANDIDA ALBICANS: NOT DETECTED
CANDIDA GLABRATA: NOT DETECTED
CANDIDA PARAPSILOSIS: NOT DETECTED
CANDIDA TROPICALIS: NOT DETECTED
Candida krusei: NOT DETECTED
ENTEROBACTER CLOACAE COMPLEX: NOT DETECTED
ENTEROBACTERIACEAE SPECIES: NOT DETECTED
ESCHERICHIA COLI: NOT DETECTED
Enterococcus species: NOT DETECTED
HAEMOPHILUS INFLUENZAE: NOT DETECTED
KLEBSIELLA OXYTOCA: NOT DETECTED
KLEBSIELLA PNEUMONIAE: NOT DETECTED
Listeria monocytogenes: NOT DETECTED
METHICILLIN RESISTANCE: DETECTED — AB
Neisseria meningitidis: NOT DETECTED
PSEUDOMONAS AERUGINOSA: NOT DETECTED
Proteus species: NOT DETECTED
STREPTOCOCCUS PNEUMONIAE: NOT DETECTED
STREPTOCOCCUS PYOGENES: NOT DETECTED
Serratia marcescens: NOT DETECTED
Staphylococcus aureus (BCID): NOT DETECTED
Staphylococcus species: DETECTED — AB
Streptococcus agalactiae: NOT DETECTED
Streptococcus species: NOT DETECTED

## 2017-03-23 LAB — BASIC METABOLIC PANEL
Anion gap: 10 (ref 5–15)
BUN: 14 mg/dL (ref 6–20)
CALCIUM: 10 mg/dL (ref 8.9–10.3)
CO2: 25 mmol/L (ref 22–32)
CREATININE: 1.19 mg/dL (ref 0.61–1.24)
Chloride: 108 mmol/L (ref 101–111)
GFR calc non Af Amer: 60 mL/min — ABNORMAL LOW (ref >60)
Glucose, Bld: 111 mg/dL — ABNORMAL HIGH (ref 65–99)
Potassium: 4 mmol/L (ref 3.5–5.1)
SODIUM: 143 mmol/L (ref 135–145)

## 2017-03-23 LAB — CBC
HCT: 37.1 % — ABNORMAL LOW (ref 39.0–52.0)
HEMOGLOBIN: 11.8 g/dL — AB (ref 13.0–17.0)
MCH: 24 pg — ABNORMAL LOW (ref 26.0–34.0)
MCHC: 31.8 g/dL (ref 30.0–36.0)
MCV: 75.6 fL — ABNORMAL LOW (ref 78.0–100.0)
Platelets: 337 10*3/uL (ref 150–400)
RBC: 4.91 MIL/uL (ref 4.22–5.81)
RDW: 16.5 % — AB (ref 11.5–15.5)
WBC: 5.9 10*3/uL (ref 4.0–10.5)

## 2017-03-23 LAB — LEGIONELLA PNEUMOPHILA SEROGP 1 UR AG: L. PNEUMOPHILA SEROGP 1 UR AG: NEGATIVE

## 2017-03-23 LAB — PROCALCITONIN: PROCALCITONIN: 0.28 ng/mL

## 2017-03-23 LAB — GLUCOSE, CAPILLARY: GLUCOSE-CAPILLARY: 111 mg/dL — AB (ref 65–99)

## 2017-03-23 LAB — URINE CULTURE: CULTURE: NO GROWTH

## 2017-03-23 MED ORDER — FUROSEMIDE 10 MG/ML IJ SOLN
40.0000 mg | Freq: Two times a day (BID) | INTRAMUSCULAR | Status: DC
  Administered 2017-03-23 – 2017-03-25 (×5): 40 mg via INTRAVENOUS
  Filled 2017-03-23 (×5): qty 4

## 2017-03-23 MED ORDER — FUROSEMIDE 10 MG/ML IJ SOLN
40.0000 mg | Freq: Every day | INTRAMUSCULAR | Status: DC
  Administered 2017-03-23: 40 mg via INTRAVENOUS
  Filled 2017-03-23: qty 4

## 2017-03-23 MED ORDER — GUAIFENESIN 100 MG/5ML PO SOLN
15.0000 mL | Freq: Four times a day (QID) | ORAL | Status: DC
  Administered 2017-03-23: 300 mg
  Administered 2017-03-23: 100 mg
  Administered 2017-03-23 – 2017-03-27 (×16): 300 mg
  Filled 2017-03-23: qty 15
  Filled 2017-03-23 (×9): qty 5
  Filled 2017-03-23: qty 10
  Filled 2017-03-23 (×2): qty 15
  Filled 2017-03-23 (×2): qty 5
  Filled 2017-03-23 (×2): qty 15
  Filled 2017-03-23 (×2): qty 5

## 2017-03-23 MED ORDER — GUAIFENESIN ER 600 MG PO TB12: 600.0000 mg | ORAL_TABLET | Freq: Two times a day (BID) | ORAL | Status: DC

## 2017-03-23 NOTE — Progress Notes (Signed)
PHARMACY - PHYSICIAN COMMUNICATION CRITICAL VALUE ALERT - BLOOD CULTURE IDENTIFICATION (BCID)  [name] [name] is an 72 y.o. male who presented to [facility] on 03/22/2017 with a chief complaint of acute on chronic CHF.  Assessment:  Staph spp w/ methicillin resistance detected in 2/3 bottles of blood cx, could be contaminant but is in >1 bottle and does have signs of PNA.  Name of physician (or Provider) ContactedModesto Charon NP  Current antibiotics: Vanc and Unasyn  Changes to prescribed antibiotics recommended:  Patient is on recommended antibiotics - No changes needed, could de-escalate if signs of infection resolve.  Results for orders placed or performed during the hospital encounter of 03/22/17  Blood Culture ID Panel (Reflexed) (Collected: 03/22/2017  4:08 AM)  Result Value Ref Range   Enterococcus species NOT DETECTED NOT DETECTED   Listeria monocytogenes NOT DETECTED NOT DETECTED   Staphylococcus species DETECTED (A) NOT DETECTED   Staphylococcus aureus NOT DETECTED NOT DETECTED   Methicillin resistance DETECTED (A) NOT DETECTED   Streptococcus species NOT DETECTED NOT DETECTED   Streptococcus agalactiae NOT DETECTED NOT DETECTED   Streptococcus pneumoniae NOT DETECTED NOT DETECTED   Streptococcus pyogenes NOT DETECTED NOT DETECTED   Acinetobacter baumannii NOT DETECTED NOT DETECTED   Enterobacteriaceae species NOT DETECTED NOT DETECTED   Enterobacter cloacae complex NOT DETECTED NOT DETECTED   Escherichia coli NOT DETECTED NOT DETECTED   Klebsiella oxytoca NOT DETECTED NOT DETECTED   Klebsiella pneumoniae NOT DETECTED NOT DETECTED   Proteus species NOT DETECTED NOT DETECTED   Serratia marcescens NOT DETECTED NOT DETECTED   Haemophilus influenzae NOT DETECTED NOT DETECTED   Neisseria meningitidis NOT DETECTED NOT DETECTED   Pseudomonas aeruginosa NOT DETECTED NOT DETECTED   Candida albicans NOT DETECTED NOT DETECTED   Candida glabrata NOT DETECTED NOT DETECTED   Candida  krusei NOT DETECTED NOT DETECTED   Candida parapsilosis NOT DETECTED NOT DETECTED   Candida tropicalis NOT DETECTED NOT DETECTED    Wynona Neat, PharmD, BCPS  03/23/2017  2:41 AM

## 2017-03-23 NOTE — Progress Notes (Signed)
PROGRESS NOTE    [name] [name]  LPF:[id number] DOB: March 30, 1945 DOA: 03/22/2017 PCP: Garwin Brothers, MD   Brief Narrative by Dr Myna Hidalgo: "[name] [name] is a 72 y.o. male with medical history significant for chronic systolic CHF, hypertension, paroxysmal atrial fibrillation, and history of CVA with hemiplegia, now presenting from his SNF for evaluation of respiratory distress.  Patient reports that he developed shortness of breath beginning 2 weeks ago and has progressively worsened since that time.  He reports an occasional cough, sometimes productive of scant thick sputum.  He reportedly had a fever at the nursing facility and was treated with Tylenol prior to transfer to the ED.  He denies chest pain or palpitations.  Upon EMS arrival, patient was reportedly saturating 86% on 4 L/min of supplemental oxygen.  He does not require supplemental oxygen at baseline".  "Upon arrival to the ED, patient is found to be afebrile, saturating mid 90s with 6 L/min of supplemental oxygen, and with vitals otherwise normal.  EKG features a sinus rhythm with PAC and nonspecific IVCD.  Chest x-ray is notable for moderate cardiomegaly with diffuse hazy opacities throughout bilateral lungs and medium pleural effusions consistent with CHF, but with pneumonia difficult to exclude.  Chemistry panel reveals a creatinine of 1.23, slightly up from priors.  CBC is notable for a stable chronic thrombocytosis with platelets 432,000.  Lactic acid is elevated to 2.34, troponin is within normal limits, and BNP is 1180".   Assessment & Plan:   Principal Problem:   Acute on chronic systolic CHF (congestive heart failure) (HCC) Active Problems:   Acute respiratory failure with hypoxia (HCC)   CVA, old, aphasia   Mild renal insufficiency   AF (paroxysmal atrial fibrillation) (HCC)   1-Acute hypoxic Respiratory Failure; Secondary to acute on chronic systolic HF exacerbation;  Pneumonia.  Chest x ray; moderated cardiomegaly with  diffuse opacity, bilateral pleural effusion. Could be HF, pneumonia difficult to exclude.  Influenza negative, blood cultures; growing staph, methicillin resistant anaerobic and aerobic bottle.  Weight; 187---190. Prior weight 240 pd on 2017.   Strep pneumonia antigen negative and legionella antigen pending.  ECHO pending.  Strict I and O.  Resume lasix IV , daily. Will do 40 mg daily.  Repeat chest x ray.  Continue with IV vancomycin and Unasyn.  Add guaifenesin.   2-Lactic acidosis. PNA, Staph methicillin resistance Bacteremia;  Continue with Unasyn to cover for aspiration PNA. Added vancomycin due to MRSA PCR positive, nursing home resident and Staph Bacteremia.  Speech swallow evaluation.  Lactic acid trending down.    Paroxysmal A fib.  EKG sinus rhythm on admission.  Continue with amiodarone.   History of CVA with hemiparesis;  continue with aspirin, lipitor.   Mild AKI; received fluids. Repeat labs in am.      DVT prophylaxis: Lovenox.  Code Status: DNR Family Communication: Care discussed with patient.  Disposition Plan: back to SNF when respiratory status improved.   Consultants:      Procedures:  ECHO---    Antimicrobials:   Unasyn 3-13.   Subjective:  I wake him up. He doesn't feel well, because he wants to eat. [birth year].  He report persistent cough. Breathing has improved.    Objective: Vitals:   03/22/17 2129 03/22/17 2353 03/23/17 0442 03/23/17 0751  BP: (!) 144/74 (!) 146/73 139/63 (!) 145/78  Pulse:   66 63  Resp: (!) 25  16 14   Temp: 98.2 F (36.8 C) 98.5 F (36.9 C) 98.4 F (36.9  C) 98.4 F (36.9 C)  TempSrc: Oral Oral Oral Oral  SpO2: 99%  99% 93%  Weight:   86.3 kg (190 lb 4.1 oz)   Height:        Intake/Output Summary (Last 24 hours) at 03/23/2017 0830 Last data filed at 03/23/2017 0300 Gross per 24 hour  Intake 933 ml  Output 450 ml  Net 483 ml   Filed Weights   03/22/17 0236 03/22/17 1335 03/23/17 0442  Weight:  84.8 kg (187 lb) 83.2 kg (183 lb 8 oz) 86.3 kg (190 lb 4.1 oz)    Examination:  General exam: In no distress, keep eyes close, answer questions.  Respiratory system: Respiratory effort normal. Bilateral crackles.  Cardiovascular system: S 1, S 2 RRR Gastrointestinal system; BS present, soft, nt. Peg tube in place.  Central nervous system: Left side hemiparesis.  Extremities: trace edema Skin: No rashes or ulcer.  Psychiatry: not assessed.     Data Reviewed: I have personally reviewed following labs and imaging studies  CBC: Recent Labs  Lab 03/22/17 0252  WBC 7.9  NEUTROABS 5.1  HGB 13.7  HCT 42.3  MCV 76.6*  PLT 932*   Basic Metabolic Panel: Recent Labs  Lab 03/22/17 0252 03/23/17 0559  NA 141 143  K 3.9 4.0  CL 107 108  CO2 20* 25  GLUCOSE 124* 111*  BUN 9 14  CREATININE 1.23 1.19  CALCIUM 10.1 10.0   GFR: Estimated Creatinine Clearance: 60.6 mL/min (by C-G formula based on SCr of 1.19 mg/dL). Liver Function Tests: Recent Labs  Lab 03/22/17 0252  AST 21  ALT 15*  ALKPHOS 113  BILITOT 0.9  PROT 6.6  ALBUMIN 3.0*   No results for input(s): LIPASE, AMYLASE in the last 168 hours. No results for input(s): AMMONIA in the last 168 hours. Coagulation Profile: No results for input(s): INR, PROTIME in the last 168 hours. Cardiac Enzymes: No results for input(s): CKTOTAL, CKMB, CKMBINDEX, TROPONINI in the last 168 hours. BNP (last 3 results) No results for input(s): PROBNP in the last 8760 hours. HbA1C: No results for input(s): HGBA1C in the last 72 hours. CBG: No results for input(s): GLUCAP in the last 168 hours. Lipid Profile: No results for input(s): CHOL, HDL, LDLCALC, TRIG, CHOLHDL, LDLDIRECT in the last 72 hours. Thyroid Function Tests: No results for input(s): TSH, T4TOTAL, FREET4, T3FREE, THYROIDAB in the last 72 hours. Anemia Panel: No results for input(s): VITAMINB12, FOLATE, FERRITIN, TIBC, IRON, RETICCTPCT in the last 72 hours. Sepsis  Labs: Recent Labs  Lab 03/22/17 1044 03/22/17 1336 03/22/17 1842 03/22/17 2137 03/23/17 0559  PROCALCITON  --   --   --   --  0.28  LATICACIDVEN 3.4* 3.7* 2.6* 1.9  --     Recent Results (from the past 240 hour(s))  Culture, blood (routine x 2)     Status: None (Preliminary result)   Collection Time: 03/22/17  4:08 AM  Result Value Ref Range Status   Specimen Description BLOOD RIGHT ARM  Final   Special Requests   Final    BOTTLES DRAWN AEROBIC AND ANAEROBIC Blood Culture adequate volume   Culture  Setup Time   Final    GRAM POSITIVE COCCI IN BOTH AEROBIC AND ANAEROBIC BOTTLES Organism ID to follow CRITICAL RESULT CALLED TO, READ BACK BY AND VERIFIED WITHAlona Bene PHARMD 03/23/17 0225 JDW Performed at [facility], [place] [address], [place], [place] [address]    Culture PENDING  Incomplete   Report Status PENDING  Incomplete  Blood Culture ID Panel (Reflexed)     Status: Abnormal   Collection Time: 03/22/17  4:08 AM  Result Value Ref Range Status   Enterococcus species NOT DETECTED NOT DETECTED Final   Listeria monocytogenes NOT DETECTED NOT DETECTED Final   Staphylococcus species DETECTED (A) NOT DETECTED Final    Comment: Methicillin (oxacillin) resistant coagulase negative staphylococcus. Possible blood culture contaminant (unless isolated from more than one blood culture draw or clinical case suggests pathogenicity). No antibiotic treatment is indicated for blood  culture contaminants. CRITICAL RESULT CALLED TO, READ BACK BY AND VERIFIED WITH: V BRYK PHARMD 03/23/17 0225 JDW    Staphylococcus aureus NOT DETECTED NOT DETECTED Final   Methicillin resistance DETECTED (A) NOT DETECTED Final    Comment: CRITICAL RESULT CALLED TO, READ BACK BY AND VERIFIED WITH: V BRYK PHARMD 03/23/17 0225 JDW    Streptococcus species NOT DETECTED NOT DETECTED Final   Streptococcus agalactiae NOT DETECTED NOT DETECTED Final   Streptococcus pneumoniae NOT DETECTED NOT DETECTED Final    Streptococcus pyogenes NOT DETECTED NOT DETECTED Final   Acinetobacter baumannii NOT DETECTED NOT DETECTED Final   Enterobacteriaceae species NOT DETECTED NOT DETECTED Final   Enterobacter cloacae complex NOT DETECTED NOT DETECTED Final   Escherichia coli NOT DETECTED NOT DETECTED Final   Klebsiella oxytoca NOT DETECTED NOT DETECTED Final   Klebsiella pneumoniae NOT DETECTED NOT DETECTED Final   Proteus species NOT DETECTED NOT DETECTED Final   Serratia marcescens NOT DETECTED NOT DETECTED Final   Haemophilus influenzae NOT DETECTED NOT DETECTED Final   Neisseria meningitidis NOT DETECTED NOT DETECTED Final   Pseudomonas aeruginosa NOT DETECTED NOT DETECTED Final   Candida albicans NOT DETECTED NOT DETECTED Final   Candida glabrata NOT DETECTED NOT DETECTED Final   Candida krusei NOT DETECTED NOT DETECTED Final   Candida parapsilosis NOT DETECTED NOT DETECTED Final   Candida tropicalis NOT DETECTED NOT DETECTED Final    Comment: Performed at [facility], [place]. [address] [place], [place] [address]  MRSA PCR Screening     Status: Abnormal   Collection Time: 03/22/17  1:54 PM  Result Value Ref Range Status   MRSA by PCR POSITIVE (A) NEGATIVE Final    Comment:        The GeneXpert MRSA Assay (FDA approved for NASAL specimens only), is one component of a comprehensive MRSA colonization surveillance program. It is not intended to diagnose MRSA infection nor to guide or monitor treatment for MRSA infections. RESULT CALLED TO, READ BACK BY AND VERIFIED WITH: K.WEST,RN AT 1635 BY L.PITT 03/22/17          Radiology Studies: Dg Chest 1 View  Result Date: 03/22/2017 CLINICAL DATA:  Shortness of breath EXAM: CHEST  1 VIEW COMPARISON:  Chest radiograph 12/20/2015 FINDINGS: Bilateral pleural effusions with lateral components. Moderate cardiomegaly. Diffuse hazy opacity throughout both lungs. IMPRESSION: Moderate cardiomegaly with diffuse hazy opacities throughout both lungs and  medium-sized bilateral pleural effusions. This may indicate congestive heart failure. Pneumonia would be difficult to exclude. Electronically Signed   By: Ulyses Jarred M.D.   On: 03/22/2017 03:26        Scheduled Meds: . amiodarone  200 mg Per Tube Daily  . aspirin  81 mg Per Tube Daily  . atorvastatin  40 mg Per Tube q1800  . calcium-vitamin D  1 tablet Per Tube Q breakfast  . Chlorhexidine Gluconate Cloth  6 each Topical Q0600  . enoxaparin (LOVENOX) injection  40 mg Subcutaneous  Q24H  . famotidine  20 mg Per Tube Daily  . gabapentin  300 mg Per Tube QHS  . magnesium oxide  400 mg Per Tube BID  . mouth rinse  15 mL Mouth Rinse BID  . metoprolol tartrate  12.5 mg Per Tube BID  . multivitamin with minerals  1 tablet Per Tube Daily  . mupirocin ointment  1 application Nasal BID  . sodium chloride flush  3 mL Intravenous Q12H  . vitamin C  500 mg Oral Daily   Continuous Infusions: . sodium chloride    . ampicillin-sulbactam (UNASYN) IV Stopped (03/23/17 0236)  . vancomycin       LOS: 1 day    Time spent: 35 minutes.     Elmarie Shiley, MD [facility] Pager [phone]  If 7PM-7AM, please contact night-coverage [email or web address] Password [email or web address] 03/23/2017, 8:30 AM

## 2017-03-23 NOTE — Evaluation (Signed)
Clinical/Bedside Swallow Evaluation Patient Details  Name: [name] [name] MRN: [id number] Date of Birth: 1945-07-12  Today's Date: 03/23/2017 Time: SLP Start Time (ACUTE ONLY): 0906 SLP Stop Time (ACUTE ONLY): 0950 SLP Time Calculation (min) (ACUTE ONLY): 44 min  Past Medical History:  Past Medical History:  Diagnosis Date  . CVA, old, aphasia   . Hemiplegia ([place])   . Intestinal obstruction ([place])   . Peripheral vascular disease ([place])   . Pneumonia    Past Surgical History:  Past Surgical History:  Procedure Laterality Date  . ABDOMINAL SURGERY     HPI:  72 year old male admitted from SNF with respiratory distress. Chest x-ray is notable for moderate cardiomegaly with diffuse hazy opacities throughout bilateral lungs and medium pleural effusions consistent with CHF, but with pneumonia difficult to exclude. On abx to cover for aspiration PNA. SLP consult ordered. PMH of CVA with resultant dysphagia, CHF, HTN, GERD, PEG placement. Per patient and daughter, consuming both nectar thick and thin liquids at SNF prior to admission.    Assessment / Plan / Recommendation Clinical Impression  Bedside swallow evaluation complete with swallowing function appearing largely consistent with most recent instrumental testing (MBS) complete 10/2016 in which patient at risk for aspiration with both thin and nectar thick liquids, minimized with use of compensatory strategies. Conversation and therapeutic intervention including max verbal, visual, and tactile instruction/cueing reveals that patient likely not carrying strategies over into use during meals to facilitate decreased aspiration risk. Patient also verbalizes a significant dislike for thickened liquids. Discussed patient performance and likely chronic nature of aspiration with patient and daughter (via phone). At this time, daughter and patient wish for improved quality of life, understanding increased risk of aspiration. Recommend dysphagia 3 diet  with thin liquids. SLP will f/u with patient in attempts to reinforce use of aspiration precautions and compensatory strategies which may help mitigate but not eliminate risk. Palliative care consult may be beneficial as patient likely to experience repeat respiratory issues as a result.  SLP Visit Diagnosis: Dysphagia, oropharyngeal phase (R13.12)    Aspiration Risk  Severe aspiration risk;Moderate aspiration risk    Diet Recommendation Dysphagia 3 (Mech soft);Thin liquid   Liquid Administration via: Cup;No straw;Spoon Medication Administration: Crushed with puree Supervision: Patient able to self feed;Full supervision/cueing for compensatory strategies Compensations: Slow rate;Small sips/bites;Clear throat intermittently;Chin tuck Postural Changes: Seated upright at 90 degrees    Other  Recommendations Oral Care Recommendations: Oral care BID   Follow up Recommendations Skilled Nursing facility      Frequency and Duration min 2x/week  1 week       Prognosis Barriers to Reach Goals: Cognitive deficits      Swallow Study   General HPI: 72 year old male admitted from SNF with respiratory distress. Chest x-ray is notable for moderate cardiomegaly with diffuse hazy opacities throughout bilateral lungs and medium pleural effusions consistent with CHF, but with pneumonia difficult to exclude. On abx to cover for aspiration PNA. SLP consult ordered. PMH of CVA with resultant dysphagia, CHF, HTN, GERD, PEG placement. Per patient and daughter, consuming both nectar thick and thin liquids at SNF prior to admission.  Type of Study: Bedside Swallow Evaluation Previous Swallow Assessment: see impression statement Diet Prior to this Study: NPO Temperature Spikes Noted: No Respiratory Status: Room air History of Recent Intubation: No Behavior/Cognition: Alert;Cooperative;Pleasant mood Oral Cavity Assessment: Dried secretions Oral Care Completed by SLP: Yes Oral Cavity - Dentition: Adequate  natural dentition Vision: Functional for self-feeding Self-Feeding Abilities: Able  to feed self Patient Positioning: Upright in bed Baseline Vocal Quality: Hoarse;Breathy Volitional Cough: Weak Volitional Swallow: Able to elicit    Oral/Motor/Sensory Function Overall Oral Motor/Sensory Function: Moderate impairment Facial ROM: Reduced left;Suspected CN VII (facial) dysfunction Facial Symmetry: Abnormal symmetry left;Suspected CN VII (facial) dysfunction Facial Strength: Reduced left;Suspected CN VII (facial) dysfunction Facial Sensation: Within Functional Limits Lingual ROM: Reduced right;Reduced left Lingual Symmetry: Within Functional Limits Lingual Strength: Reduced Lingual Sensation: Within Functional Limits Velum: Within Functional Limits Mandible: Within Functional Limits   Ice Chips Ice chips: Not tested   Thin Liquid Thin Liquid: Impaired Presentation: Cup;Spoon Oral Phase Impairments: Reduced lingual movement/coordination Pharyngeal  Phase Impairments: Suspected delayed Swallow;Wet Vocal Quality;Cough - Delayed;Multiple swallows    Nectar Thick Nectar Thick Liquid: Impaired Presentation: Cup;Self Fed;Spoon Pharyngeal Phase Impairments: Suspected delayed Swallow;Wet Vocal Quality   Honey Thick Honey Thick Liquid: Not tested   Puree Puree: Within functional limits Presentation: Spoon   Solid   GO   Solid: Impaired Presentation: Self Fed Oral Phase Functional Implications: Impaired mastication;Prolonged oral transit Pharyngeal Phase Impairments: Cough - Delayed       [name] Shankman MA, CCC-SLP [phone]  [name] [name] [name] [name] 03/23/2017,10:13 AM

## 2017-03-23 NOTE — Care Management Note (Signed)
Case Management Note  Patient Details  Name: [name] [name] MRN: [id number] Date of Birth: 1945/04/14  Subjective/Objective:                 Patient from [place]. Apparently Medicaid expired and needs to be recertified. CSW addressing this with wife. Admitted with CHF, (likely aspiration) PNA, PEG tube. CM/ CSW following.    Action/Plan:   Expected Discharge Date:                  Expected Discharge Plan:  Skilled Nursing Facility  In-House Referral:  Clinical Social Work  Discharge planning Services  CM Consult  Post Acute Care Choice:    Choice offered to:     DME Arranged:    DME Agency:     HH Arranged:    [place] Agency:     Status of Service:  In process, will continue to follow  If discussed at Long Length of Stay Meetings, dates discussed:    Additional Comments:  Carles Collet, RN 03/23/2017, 11:25 AM

## 2017-03-23 NOTE — Clinical Social Work Note (Signed)
Clinical Social Work Assessment  Patient Details  Name: [name] [name] MRN: [id number] Date of Birth: 1945-06-18  Date of referral:  03/23/17               Reason for consult:  Facility Placement(From [facility])                Permission sought to share information with:  Facility Sport and exercise psychologist, Family Supports Permission granted to share information::  Yes, Verbal Permission Granted  Name::     [name] [name]; [place]::  [facility]  Relationship::  daughter; spouse  Contact Information:  [phone]; [phone]  Housing/Transportation Living arrangements for the past 2 months:  [place] of Information:  Patient, Facility, Spouse Patient Interpreter Needed:  None Criminal Activity/Legal Involvement Pertinent to Current Situation/Hospitalization:  No - Comment as needed Significant Relationships:  Adult Children, Spouse Lives with:  Facility Resident Do you feel safe going back to the place where you live?  Yes Need for family participation in patient care:  Yes (Comment)  Care giving concerns: Patient is long term care resident at [facility] [name] indicated patient's Medicaid coverage has lapsed.  Social Worker assessment / plan: CSW spoke to admissions at [facility] [name]. They indicated patient's Medicaid lapsed three months ago. Patient's family are his rep payee and are responsible for re-certifying his Medicaid.   CSW spoke to patient's wife via phone. Wife, [name] [name], lives in [place]. She indicated patient moved down here to be near his children and then entered SNF. [name] aware patient's Medicaid lapsed, as she spoke to [facility] today, though she was not sure how to re-certify it. CSW referred [name] [name] to [place].   CSW met with patient at bedside. Patient indicated he moved here a year ago. Patient indicated awareness of the issue with his Medicaid, as he had spoken with his wife today. Patient stated his  daughter, [name] [name], helps him with his care arrangements and Medicaid and gave permission to speak with her.  CSW called [name] and discussed Medicaid. Daughter was not aware of any issues and stated she had not been informed by [facility] that it had lapsed. CSW also referred daughter to DSS for re-cert.  Patient may need to re-enter SNF under his primary insurance, [facility] Medicare, in which case he will have to have a skillable need. CSW requested RN to order PT/OT.  CSW to follow and support with discharge planning.  Employment status:  Retired Forensic scientist:  Medicaid [place], Managed Medicare(UHC) PT Recommendations:  Not assessed at this time Information / Referral to community resources:  Home  Patient/Family's Response to care: Patient and family appreciative of care.  Patient/Family's Understanding of and Emotional Response to Diagnosis, Current Treatment, and Prognosis: Not discussed. However, patient and family do agree plan is to return to [facility].   Emotional Assessment Appearance:  Appears stated age Attitude/Demeanor/Rapport:  Engaged, Lethargic Affect (typically observed):  Calm Orientation:  Oriented to Self, Oriented to Place, Oriented to  Time, Oriented to Situation Alcohol / Substance use:  Not Applicable Psych involvement (Current and /or in the community):  No (Comment)  Discharge Needs  Concerns to be addressed:  Discharge Planning Concerns, Care Coordination, Financial / Insurance Concerns Readmission within the last 30 days:  No Current discharge risk:  Physical Impairment Barriers to Discharge:  Continued Medical Work up, Inadequate or no insurance(Medicaid lapsed)   Estanislado Emms, LCSW 03/23/2017, 3:12 PM

## 2017-03-24 ENCOUNTER — Inpatient Hospital Stay (HOSPITAL_COMMUNITY): Payer: Medicare Other

## 2017-03-24 DIAGNOSIS — I340 Nonrheumatic mitral (valve) insufficiency: Secondary | ICD-10-CM

## 2017-03-24 LAB — BASIC METABOLIC PANEL
Anion gap: 11 (ref 5–15)
BUN: 16 mg/dL (ref 6–20)
CO2: 27 mmol/L (ref 22–32)
Calcium: 10.3 mg/dL (ref 8.9–10.3)
Chloride: 106 mmol/L (ref 101–111)
Creatinine, Ser: 1.35 mg/dL — ABNORMAL HIGH (ref 0.61–1.24)
GFR calc Af Amer: 59 mL/min — ABNORMAL LOW (ref >60)
GFR, EST NON AFRICAN AMERICAN: 51 mL/min — AB (ref >60)
GLUCOSE: 93 mg/dL (ref 65–99)
POTASSIUM: 3.3 mmol/L — AB (ref 3.5–5.1)
Sodium: 144 mmol/L (ref 135–145)

## 2017-03-24 LAB — CULTURE, BLOOD (ROUTINE X 2): SPECIAL REQUESTS: ADEQUATE

## 2017-03-24 LAB — CBC
HEMATOCRIT: 41.2 % (ref 39.0–52.0)
Hemoglobin: 13.2 g/dL (ref 13.0–17.0)
MCH: 24.5 pg — AB (ref 26.0–34.0)
MCHC: 32 g/dL (ref 30.0–36.0)
MCV: 76.6 fL — ABNORMAL LOW (ref 78.0–100.0)
Platelets: 363 10*3/uL (ref 150–400)
RBC: 5.38 MIL/uL (ref 4.22–5.81)
RDW: 17 % — AB (ref 11.5–15.5)
WBC: 8.1 10*3/uL (ref 4.0–10.5)

## 2017-03-24 LAB — ECHOCARDIOGRAM COMPLETE
HEIGHTINCHES: 71 in
WEIGHTICAEL: 3061.75 [oz_av]

## 2017-03-24 MED ORDER — POTASSIUM CHLORIDE 20 MEQ PO PACK
40.0000 meq | PACK | Freq: Once | ORAL | Status: AC
  Administered 2017-03-24: 40 meq
  Filled 2017-03-24: qty 2

## 2017-03-24 NOTE — Progress Notes (Signed)
Physical Therapy Evaluation Patient Details Name: [name] [name] MRN: [id number] DOB: 25-Jan-1945 Today's Date: 03/24/2017   History of Present Illness  "[name] [name] a 72 y.o.malewith medical history significant forchronic systolic CHF, hypertension, paroxysmal atrial fibrillation, and history of CVA with hemiplegia, now presenting from his SNF for evaluation of respiratory distress. Patient reports that he developed shortness of breath beginning 2 weeks ago and has progressively worsened since that time. He reports an occasional cough, sometimes productive of scant thick sputum. He reportedly had a fever at the nursing facility and was treated with Tylenol prior to transfer to the ED. He denies chest pain or palpitations. Upon EMS arrival, patient was reportedly saturating 86% on 4 L/min of supplemental oxygen. He does not require supplemental oxygen at baseline".  Clinical Impression  Pt admitted with/for Respiratory distress.  Mobility requires significant assist, but I believe pt could benefit from some further rehab to help assist with burden of care. .  Pt currently limited functionally due to the problems listed. ( See problems list.)   Pt will benefit from PT to maximize function and safety in order to get ready for next venue listed below.     Follow Up Recommendations SNF;Other (comment);Supervision/Assistance - 24 hour    Equipment Recommendations       Recommendations for Other Services       Precautions / Restrictions Precautions Precautions: Fall Precaution Comments: +2 assist required, mechanical lift is use dat SNF Restrictions Weight Bearing Restrictions: No Other Position/Activity Restrictions: pt non ambulatory, LTC resident of SNF      Mobility  Bed Mobility Overal bed mobility: Needs Assistance Bed Mobility: Rolling;Sidelying to Sit;Sit to Sidelying Rolling: Total assist;+2 for physical assistance;+2 for safety/equipment Sidelying to sit: Total  assist;+2 for physical assistance     Sit to sidelying: Total assist;+2 for physical assistance General bed mobility comments: cued for sequencing, significant assist and support for normal movement, limited by moderate contracture  Transfers Overall transfer level: Needs assistance               General transfer comment: total A with mechanical lift at SNF per pt, OOB in recliner every other day at [facility]  Ambulation/Gait                Stairs            Wheelchair Mobility    Modified Rankin (Stroke Patients Only)       Balance Overall balance assessment: Needs assistance Sitting-balance support: Feet supported;Single extremity supported;Bilateral upper extremity supported Sitting balance-Leahy Scale: Poor Sitting balance - Comments: trunk extensors tight (and passively insufficient) to sit upright without assist forward.  With guard, pt made a notable effort with abs to remain upright, but consistently lists slowly posteriorly. Postural control: Posterior lean                                   Pertinent Vitals/Pain Pain Assessment: No/denies pain(discomfort if ROM pushed) Faces Pain Scale: Hurts little more Pain Intervention(s): Monitored during session    Home Living Family/patient expects to be discharged to:: Skilled nursing facility                 Additional Comments: LTC resident at [facility]    Prior Function Level of Independence: Needs assistance   Gait / Transfers Assistance Needed: total, mechnical lift at SNF  ADL's / Homemaking Assistance Needed: total  Hand Dominance   Dominant Hand: Right    Extremity/Trunk Assessment   Upper Extremity Assessment Upper Extremity Assessment: Defer to OT evaluation LUE Deficits / Details: pt moving L UE in a horizontal plane in synergy    Lower Extremity Assessment Lower Extremity Assessment: Generalized weakness;RLE deficits/detail;LLE  deficits/detail RLE Deficits / Details: limited in assisted flexion ROM to approx >=50*.  Pt can generate gross extension, but significantly weak at 2+5 in gross flexion. RLE Coordination: decreased fine motor;decreased gross motor LLE Deficits / Details: leg held in ER.  Any movement is in synergy and does not move against gravity. LLE Coordination: decreased fine motor;decreased gross motor       Communication   Communication: Expressive difficulties  Cognition Arousal/Alertness: Awake/alert Behavior During Therapy: WFL for tasks assessed/performed Overall Cognitive Status: History of cognitive impairments - at baseline                                        General Comments      Exercises     Assessment/Plan    PT Assessment Patient needs continued PT services  PT Problem List Decreased strength;Decreased range of motion;Decreased activity tolerance;Decreased balance;Decreased mobility;Decreased coordination;Decreased knowledge of use of DME;Decreased knowledge of precautions       PT Treatment Interventions Functional mobility training;Therapeutic activities;Therapeutic exercise;Balance training;Patient/family education    PT Goals (Current goals can be found in the Care Plan section)  Acute Rehab PT Goals Patient Stated Goal: go back to SNF per pt PT Goal Formulation: With patient Time For Goal Achievement: 03/31/17 Potential to Achieve Goals: Good    Frequency Min 2X/week   Barriers to discharge        Co-evaluation               AM-PAC PT "6 Clicks" Daily Activity  Outcome Measure Difficulty turning over in bed (including adjusting bedclothes, sheets and blankets)?: Unable Difficulty moving from lying on back to sitting on the side of the bed? : Unable Difficulty sitting down on and standing up from a chair with arms (e.g., wheelchair, bedside commode, etc,.)?: Unable Help needed moving to and from a bed to chair (including a  wheelchair)?: Total Help needed walking in hospital room?: Total Help needed climbing 3-5 steps with a railing? : Total 6 Click Score: 6    End of Session   Activity Tolerance: Patient tolerated treatment well   Nurse Communication: Mobility status PT Visit Diagnosis: Other abnormalities of gait and mobility (R26.89);Muscle weakness (generalized) (M62.81);Pain;Hemiplegia and hemiparesis    Time: 8144-8185 PT Time Calculation (min) (ACUTE ONLY): 20 min   Charges:   PT Evaluation $PT Eval Moderate Complexity: 1 Mod     PT G Codes:        Apr 18, 2017  Donnella Sham, [place] [phone]  (pager)  Tessie Fass Ronen Bromwell 2017/04/18, 4:17 PM

## 2017-03-24 NOTE — NC FL2 (Signed)
[place] LEVEL OF CARE SCREENING TOOL     IDENTIFICATION  Patient Name: [name] [name] Birthdate: 02-10-1945 Sex: male Admission Date (Current Location): 03/22/2017  [facility] and [place] Number:  Herbalist and Address:  [facility] [place] [place] [address] [place], [place] [address]      Provider Number: [id number]  Attending Physician Name and Address:  Elmarie Shiley, MD  Relative Name and Phone Number:  [name], daughter     Current Level of Care: Hospital Recommended Level of Care: [place] Prior Approval Number:    Date Approved/Denied:   PASRR Number: [id number]  Discharge Plan: SNF    Current Diagnoses: Patient Active Problem List   Diagnosis Date Noted  . Acute respiratory failure with hypoxia ([place]) 03/22/2017  . CVA, old, aphasia 03/22/2017  . Acute on chronic systolic CHF (congestive heart failure) ([place]) 03/22/2017  . Mild renal insufficiency 03/22/2017  . AF (paroxysmal atrial fibrillation) ([place]) 03/22/2017    Orientation RESPIRATION BLADDER Height & Weight     Self, Time, Situation, Place  O2(nasal cannula 3L) Incontinent, External catheter Weight: 191 lb 5.8 oz (86.8 kg) Height:  5\' 11"  (180.3 cm)  BEHAVIORAL SYMPTOMS/MOOD NEUROLOGICAL BOWEL NUTRITION STATUS        Diet(dysphagia III/please see DC summary)  AMBULATORY STATUS COMMUNICATION OF NEEDS Skin   Extensive Assist Verbally Normal                       Personal Care Assistance Level of Assistance  Bathing, Feeding, Dressing Bathing Assistance: Maximum assistance Feeding assistance: Independent Dressing Assistance: Maximum assistance     Functional Limitations Info  Sight, Hearing, Speech Sight Info: Adequate Hearing Info: Adequate Speech Info: Impaired    SPECIAL CARE FACTORS FREQUENCY  PT (By licensed PT), OT (By licensed OT)     PT Frequency: 5x/week OT Frequency: 5x/week            Contractures  Contractures Info: Not present    Additional Factors Info  Code Status, Allergies, Isolation Precautions Code Status Info: DNR Allergies Info: No Known Allergies     Isolation Precautions Info: contact precautions, MRSA     Current Medications (03/24/2017):  This is the current hospital active medication list Current Facility-Administered Medications  Medication Dose Route Frequency Provider Last Rate Last Dose  . 0.9 %  sodium chloride infusion  250 mL Intravenous PRN Opyd, Ilene Qua, MD      . acetaminophen (TYLENOL) tablet 650 mg  650 mg Oral Q4H PRN Opyd, Ilene Qua, MD      . ALPRAZolam Duanne Moron) tablet 0.25 mg  0.25 mg Oral BID PRN Opyd, Ilene Qua, MD      . amiodarone (PACERONE) tablet 200 mg  200 mg Per Tube Daily Opyd, Ilene Qua, MD   200 mg at 03/24/17 1000  . Ampicillin-Sulbactam (UNASYN) 3 g in sodium chloride 0.9 % 100 mL IVPB  3 g Intravenous Q8H Regalado, Belkys A, MD   Stopped at 03/24/17 0216  . aspirin chewable tablet 81 mg  81 mg Per Tube Daily Opyd, Ilene Qua, MD   81 mg at 03/24/17 1000  . atorvastatin (LIPITOR) tablet 40 mg  40 mg Per Tube q1800 Opyd, Ilene Qua, MD   40 mg at 03/23/17 1721  . calcium-vitamin D (OSCAL WITH D) 500-200 MG-UNIT per tablet 1 tablet  1 tablet Per Tube Q breakfast Opyd, Ilene Qua, MD   1 tablet at 03/24/17  0900  . Chlorhexidine Gluconate Cloth 2 % PADS 6 each  6 each Topical Q0600 Regalado, Belkys A, MD   6 each at 03/24/17 0614  . enoxaparin (LOVENOX) injection 40 mg  40 mg Subcutaneous Q24H Opyd, Ilene Qua, MD   40 mg at 03/23/17 1617  . famotidine (PEPCID) tablet 20 mg  20 mg Per Tube Daily Opyd, Ilene Qua, MD   20 mg at 03/24/17 0920  . furosemide (LASIX) injection 40 mg  40 mg Intravenous Q12H Regalado, Belkys A, MD   40 mg at 03/24/17 1000  . gabapentin (NEURONTIN) capsule 300 mg  300 mg Per Tube QHS Opyd, Ilene Qua, MD   300 mg at 03/23/17 2303  . guaiFENesin (ROBITUSSIN) 100 MG/5ML solution 300 mg  15 mL Per Tube Q6H Regalado, Belkys A,  MD   300 mg at 03/24/17 0612  . magnesium oxide (MAG-OX) tablet 400 mg  400 mg Per Tube BID Vianne Bulls, MD   400 mg at 03/23/17 2303  . MEDLINE mouth rinse  15 mL Mouth Rinse BID Regalado, Belkys A, MD   15 mL at 03/24/17 1230  . metoprolol tartrate (LOPRESSOR) tablet 12.5 mg  12.5 mg Per Tube BID Opyd, Ilene Qua, MD   12.5 mg at 03/24/17 1000  . multivitamin with minerals tablet 1 tablet  1 tablet Per Tube Daily Opyd, Ilene Qua, MD   1 tablet at 03/24/17 1231  . mupirocin ointment (BACTROBAN) 2 % 1 application  1 application Nasal BID Regalado, Belkys A, MD   1 application at 58/52/77 2304  . MUSCLE RUB CREA   Topical PRN Regalado, Belkys A, MD      . ondansetron (ZOFRAN) injection 4 mg  4 mg Intravenous Q6H PRN Opyd, Ilene Qua, MD      . potassium chloride (KLOR-CON) packet 40 mEq  40 mEq Per Tube Once Regalado, Belkys A, MD      . sodium chloride flush (NS) 0.9 % injection 3 mL  3 mL Intravenous Q12H Opyd, Ilene Qua, MD   3 mL at 03/23/17 2115  . sodium chloride flush (NS) 0.9 % injection 3 mL  3 mL Intravenous PRN Opyd, Ilene Qua, MD      . vitamin C (ASCORBIC ACID) tablet 500 mg  500 mg Oral Daily Opyd, Ilene Qua, MD   500 mg at 03/23/17 1003  . zolpidem (AMBIEN) tablet 5 mg  5 mg Oral QHS PRN Opyd, Ilene Qua, MD         Discharge Medications: Please see discharge summary for a list of discharge medications.  Relevant Imaging Results:  Relevant Lab Results:   Additional Information SSN: [id number]  Estanislado Emms, LCSW

## 2017-03-24 NOTE — Progress Notes (Signed)
  Echocardiogram 2D Echocardiogram has been performed.  [name] [name] 03/24/2017, 10:52 AM

## 2017-03-24 NOTE — Progress Notes (Signed)
PROGRESS NOTE    [name] [name]  PPJ:[id number] DOB: 08-Aug-1945 DOA: 03/22/2017 PCP: Garwin Brothers, MD   Brief Narrative by Dr Myna Hidalgo: "[name] [name] is a 72 y.o. male with medical history significant for chronic systolic CHF, hypertension, paroxysmal atrial fibrillation, and history of CVA with hemiplegia, now presenting from his SNF for evaluation of respiratory distress.  Patient reports that he developed shortness of breath beginning 2 weeks ago and has progressively worsened since that time.  He reports an occasional cough, sometimes productive of scant thick sputum.  He reportedly had a fever at the nursing facility and was treated with Tylenol prior to transfer to the ED.  He denies chest pain or palpitations.  Upon EMS arrival, patient was reportedly saturating 86% on 4 L/min of supplemental oxygen.  He does not require supplemental oxygen at baseline".  "Upon arrival to the ED, patient is found to be afebrile, saturating mid 90s with 6 L/min of supplemental oxygen, and with vitals otherwise normal.  EKG features a sinus rhythm with PAC and nonspecific IVCD.  Chest x-ray is notable for moderate cardiomegaly with diffuse hazy opacities throughout bilateral lungs and medium pleural effusions consistent with CHF, but with pneumonia difficult to exclude.  Chemistry panel reveals a creatinine of 1.23, slightly up from priors.  CBC is notable for a stable chronic thrombocytosis with platelets 432,000.  Lactic acid is elevated to 2.34, troponin is within normal limits, and BNP is 1180".   Assessment & Plan:   Principal Problem:   Acute on chronic systolic CHF (congestive heart failure) (HCC) Active Problems:   Acute respiratory failure with hypoxia (HCC)   CVA, old, aphasia   Mild renal insufficiency   AF (paroxysmal atrial fibrillation) (HCC)   1-Acute hypoxic Respiratory Failure; Secondary to acute on chronic systolic HF exacerbation;  Pneumonia.  Chest x ray; moderated cardiomegaly with  diffuse opacity, bilateral pleural effusion. Could be HF, pneumonia difficult to exclude.  Influenza negative, blood cultures; growing staph, methicillin resistant anaerobic and aerobic bottle.  Weight; 187---190. Prior weight 240 pd on 2017.   Strep pneumonia antigen negative and legionella antigen pending.  ECHO pending.  Strict I and O.  Continue with lasix IV BID Chest x ray with HF  Continue with IV Unasyn.  Added  guaifenesin.   2-Lactic acidosis. PNA,  Continue with Unasyn to cover for aspiration PNA. Added vancomycin due to MRSA PCR positive, nursing home resident and Staph Bacteremia.  Speech swallow evaluation. At risk for aspiration Lactic acid trending down.  Staph methicillin resistance 1 set of 2 blood culture . Discussed with Dr Linus Salmons. Likely contaminate. Will stop vancomycin.  He is at risk for aspiration, he wants to continue with diet. Refuse use Peg tube for nutrition. I will consult Palliative for goals of care and further discussion with patient and family.   Paroxysmal A fib.  EKG sinus rhythm on admission.  Continue with amiodarone.   History of CVA with hemiparesis;  continue with aspirin, lipitor.   Mild AKI; received fluids. Monitor on lasix.      DVT prophylaxis: Lovenox.  Code Status: DNR Family Communication: Care discussed with patient.  Disposition Plan: back to SNF when respiratory status improved.   Consultants:      Procedures:  ECHO---    Antimicrobials:   Unasyn 3-13.   Subjective: He is feeling better, he cough some after eating. He denies chest pain or abdominal pain  He wants to continue to eat, knowing risk for aspiration.   Objective:  Vitals:   03/23/17 1951 03/23/17 2302 03/24/17 0012 03/24/17 0429  BP: (!) 149/77  (!) 142/71 (!) 154/75  Pulse:  71 76 77  Resp: 19   20  Temp: (!) 97.5 F (36.4 C)  97.8 F (36.6 C) 98.1 F (36.7 C)  TempSrc: Oral  Oral Oral  SpO2:    96%  Weight:    86.8 kg (191 lb 5.8 oz)    Height:        Intake/Output Summary (Last 24 hours) at 03/24/2017 0829 Last data filed at 03/24/2017 0417 Gross per 24 hour  Intake 853 ml  Output 2550 ml  Net -1697 ml   Filed Weights   03/22/17 1335 03/23/17 0442 03/24/17 0429  Weight: 83.2 kg (183 lb 8 oz) 86.3 kg (190 lb 4.1 oz) 86.8 kg (191 lb 5.8 oz)    Examination:  General exam: NAD Respiratory system: Normal respiratory effort, crackles B/L  Cardiovascular system: S 1, S 2 RRR Gastrointestinal system; BS presents, Peg tube in place.  Central nervous system: left side hemiparesis.  Extremities: trace edema.  Skin: no rash Psychiatry: not assessed.     Data Reviewed: I have personally reviewed following labs and imaging studies  CBC: Recent Labs  Lab 03/22/17 0252 03/23/17 0852  WBC 7.9 5.9  NEUTROABS 5.1  --   HGB 13.7 11.8*  HCT 42.3 37.1*  MCV 76.6* 75.6*  PLT 432* 161   Basic Metabolic Panel: Recent Labs  Lab 03/22/17 0252 03/23/17 0559  NA 141 143  K 3.9 4.0  CL 107 108  CO2 20* 25  GLUCOSE 124* 111*  BUN 9 14  CREATININE 1.23 1.19  CALCIUM 10.1 10.0   GFR: Estimated Creatinine Clearance: 60.6 mL/min (by C-G formula based on SCr of 1.19 mg/dL). Liver Function Tests: Recent Labs  Lab 03/22/17 0252  AST 21  ALT 15*  ALKPHOS 113  BILITOT 0.9  PROT 6.6  ALBUMIN 3.0*   No results for input(s): LIPASE, AMYLASE in the last 168 hours. No results for input(s): AMMONIA in the last 168 hours. Coagulation Profile: No results for input(s): INR, PROTIME in the last 168 hours. Cardiac Enzymes: No results for input(s): CKTOTAL, CKMB, CKMBINDEX, TROPONINI in the last 168 hours. BNP (last 3 results) No results for input(s): PROBNP in the last 8760 hours. HbA1C: No results for input(s): HGBA1C in the last 72 hours. CBG: Recent Labs  Lab 03/23/17 1137  GLUCAP 111*   Lipid Profile: No results for input(s): CHOL, HDL, LDLCALC, TRIG, CHOLHDL, LDLDIRECT in the last 72 hours. Thyroid Function  Tests: No results for input(s): TSH, T4TOTAL, FREET4, T3FREE, THYROIDAB in the last 72 hours. Anemia Panel: No results for input(s): VITAMINB12, FOLATE, FERRITIN, TIBC, IRON, RETICCTPCT in the last 72 hours. Sepsis Labs: Recent Labs  Lab 03/22/17 1044 03/22/17 1336 03/22/17 1842 03/22/17 2137 03/23/17 0559  PROCALCITON  --   --   --   --  0.28  LATICACIDVEN 3.4* 3.7* 2.6* 1.9  --     Recent Results (from the past 240 hour(s))  Culture, blood (routine x 2)     Status: None (Preliminary result)   Collection Time: 03/22/17  2:44 AM  Result Value Ref Range Status   Specimen Description BLOOD LEFT HAND  Final   Special Requests IN PEDIATRIC BOTTLE Blood Culture adequate volume  Final   Culture   Final    NO GROWTH 1 DAY Performed at [facility], [place] [address] [place], [place] [address]  Report Status PENDING  Incomplete  Culture, blood (routine x 2)     Status: None (Preliminary result)   Collection Time: 03/22/17  4:08 AM  Result Value Ref Range Status   Specimen Description BLOOD RIGHT ARM  Final   Special Requests   Final    BOTTLES DRAWN AEROBIC AND ANAEROBIC Blood Culture adequate volume   Culture  Setup Time   Final    GRAM POSITIVE COCCI IN BOTH AEROBIC AND ANAEROBIC BOTTLES CRITICAL RESULT CALLED TO, READ BACK BY AND VERIFIED WITHAlona Bene PHARMD 03/23/17 0225 JDW Performed at [facility], [place] [address] [place], [place] [address]    Culture GRAM POSITIVE COCCI  Final   Report Status PENDING  Incomplete  Blood Culture ID Panel (Reflexed)     Status: Abnormal   Collection Time: 03/22/17  4:08 AM  Result Value Ref Range Status   Enterococcus species NOT DETECTED NOT DETECTED Final   Listeria monocytogenes NOT DETECTED NOT DETECTED Final   Staphylococcus species DETECTED (A) NOT DETECTED Final    Comment: Methicillin (oxacillin) resistant coagulase negative staphylococcus. Possible blood culture contaminant (unless isolated from more than one blood  culture draw or clinical case suggests pathogenicity). No antibiotic treatment is indicated for blood  culture contaminants. CRITICAL RESULT CALLED TO, READ BACK BY AND VERIFIED WITH: V BRYK PHARMD 03/23/17 0225 JDW    Staphylococcus aureus NOT DETECTED NOT DETECTED Final   Methicillin resistance DETECTED (A) NOT DETECTED Final    Comment: CRITICAL RESULT CALLED TO, READ BACK BY AND VERIFIED WITH: V BRYK PHARMD 03/23/17 0225 JDW    Streptococcus species NOT DETECTED NOT DETECTED Final   Streptococcus agalactiae NOT DETECTED NOT DETECTED Final   Streptococcus pneumoniae NOT DETECTED NOT DETECTED Final   Streptococcus pyogenes NOT DETECTED NOT DETECTED Final   Acinetobacter baumannii NOT DETECTED NOT DETECTED Final   Enterobacteriaceae species NOT DETECTED NOT DETECTED Final   Enterobacter cloacae complex NOT DETECTED NOT DETECTED Final   Escherichia coli NOT DETECTED NOT DETECTED Final   Klebsiella oxytoca NOT DETECTED NOT DETECTED Final   Klebsiella pneumoniae NOT DETECTED NOT DETECTED Final   Proteus species NOT DETECTED NOT DETECTED Final   Serratia marcescens NOT DETECTED NOT DETECTED Final   Haemophilus influenzae NOT DETECTED NOT DETECTED Final   Neisseria meningitidis NOT DETECTED NOT DETECTED Final   Pseudomonas aeruginosa NOT DETECTED NOT DETECTED Final   Candida albicans NOT DETECTED NOT DETECTED Final   Candida glabrata NOT DETECTED NOT DETECTED Final   Candida krusei NOT DETECTED NOT DETECTED Final   Candida parapsilosis NOT DETECTED NOT DETECTED Final   Candida tropicalis NOT DETECTED NOT DETECTED Final    Comment: Performed at [facility], [place]. [address], [place], [place] [address]  Urine culture     Status: None   Collection Time: 03/22/17  4:10 AM  Result Value Ref Range Status   Specimen Description URINE, CATHETERIZED  Final   Special Requests NONE  Final   Culture   Final    NO GROWTH Performed at [facility], [address] [place], [place]  [address]    Report Status 03/23/2017 FINAL  Final  MRSA PCR Screening     Status: Abnormal   Collection Time: 03/22/17  1:54 PM  Result Value Ref Range Status   MRSA by PCR POSITIVE (A) NEGATIVE Final    Comment:        The GeneXpert MRSA Assay (FDA approved for NASAL specimens only), is  one component of a comprehensive MRSA colonization surveillance program. It is not intended to diagnose MRSA infection nor to guide or monitor treatment for MRSA infections. RESULT CALLED TO, READ BACK BY AND VERIFIED WITH: K.WEST,RN AT 1635 BY L.PITT 03/22/17          Radiology Studies: Dg Chest 2 View  Result Date: 03/23/2017 CLINICAL DATA:  Cough EXAM: CHEST - 2 VIEW COMPARISON:  03/22/2017 FINDINGS: Diffuse airspace disease and layering effusions. Cardiomegaly. Findings likely reflect moderate CHF. No real change since prior study. IMPRESSION: Bilateral airspace disease and layering effusions, most likely moderate CHF. No change. Electronically Signed   By: Rolm Baptise M.D.   On: 03/23/2017 10:56        Scheduled Meds: . amiodarone  200 mg Per Tube Daily  . aspirin  81 mg Per Tube Daily  . atorvastatin  40 mg Per Tube q1800  . calcium-vitamin D  1 tablet Per Tube Q breakfast  . Chlorhexidine Gluconate Cloth  6 each Topical Q0600  . enoxaparin (LOVENOX) injection  40 mg Subcutaneous Q24H  . famotidine  20 mg Per Tube Daily  . furosemide  40 mg Intravenous Q12H  . gabapentin  300 mg Per Tube QHS  . guaiFENesin  15 mL Per Tube Q6H  . magnesium oxide  400 mg Per Tube BID  . mouth rinse  15 mL Mouth Rinse BID  . metoprolol tartrate  12.5 mg Per Tube BID  . multivitamin with minerals  1 tablet Per Tube Daily  . mupirocin ointment  1 application Nasal BID  . sodium chloride flush  3 mL Intravenous Q12H  . vitamin C  500 mg Oral Daily   Continuous Infusions: . sodium chloride    . ampicillin-sulbactam (UNASYN) IV Stopped (03/24/17 0216)  . vancomycin Stopped (03/23/17 2221)      LOS: 2 days    Time spent: 35 minutes.     Elmarie Shiley, MD [facility] Pager [phone]  If 7PM-7AM, please contact night-coverage [email or web address] Password [facility] 03/24/2017, 8:29 AM

## 2017-03-24 NOTE — Evaluation (Signed)
Occupational Therapy Evaluation Patient Details Name: [name] [name] MRN: [id number] DOB: Jun 03, 1945 Today's Date: 03/24/2017    History of Present Illness "[name] [name] a 72 y.o.malewith medical history significant forchronic systolic CHF, hypertension, paroxysmal atrial fibrillation, and history of CVA with hemiplegia, now presenting from his SNF for evaluation of respiratory distress. Patient reports that he developed shortness of breath beginning 2 weeks ago and has progressively worsened since that time. He reports an occasional cough, sometimes productive of scant thick sputum. He reportedly had a fever at the nursing facility and was treated with Tylenol prior to transfer to the ED. He denies chest pain or palpitations. Upon EMS arrival, patient was reportedly saturating 86% on 4 L/min of supplemental oxygen. He does not require supplemental oxygen at baseline".   Clinical Impression   Pt is a LTC resident of [facility] and requires total A for selfcare and mobility using mechanical lift. Pt able to feed himself and perform simple grooming. Pt is at baseline and no further acute OT services are indicated at this time, any further OT intervention is deferred to his SNF.     Follow Up Recommendations  SNF    Equipment Recommendations  None recommended by OT    Recommendations for Other Services       Precautions / Restrictions Precautions Precautions: Fall Precaution Comments: +2 assist required, mechanical lift is use dat SNF Restrictions Weight Bearing Restrictions: No Other Position/Activity Restrictions: pt non ambulatory, LTC resident of SNF      Mobility Bed Mobility Overal bed mobility: Needs Assistance Bed Mobility: Rolling Rolling: Total assist;+2 for physical assistance;+2 for safety/equipment            Transfers Overall transfer level: Needs assistance               General transfer comment: total A with mechanical lift at  SNF per pt, OOB in recliner every other day at SNF    Balance Overall balance assessment: (NT)                                         ADL either performed or assessed with clinical judgement   ADL                                         General ADL Comments: total A at baseline for all ADLs and mobility, reports he is able to feed himself, set up with sup for simulated feeding tasks     Vision Patient Visual Report: No change from baseline       Perception     Praxis      Pertinent Vitals/Pain Pain Assessment: No/denies pain     Hand Dominance Right   Extremity/Trunk Assessment Upper Extremity Assessment Upper Extremity Assessment: Generalized weakness;LUE deficits/detail LUE Deficits / Details: hemiplegia from old CVA   Lower Extremity Assessment Lower Extremity Assessment: Defer to PT evaluation       Communication Communication Communication: Expressive difficulties   Cognition Arousal/Alertness: Awake/alert Behavior During Therapy: WFL for tasks assessed/performed Overall Cognitive Status: History of cognitive impairments - at baseline  General Comments       Exercises     Shoulder Instructions      Home Living Family/patient expects to be discharged to:: Skilled nursing facility                                 Additional Comments: LTC resident at [facility]      Prior Functioning/Environment Level of Independence: Needs assistance  Gait / Transfers Assistance Needed: total, mechnical lift at SNF ADL's / Homemaking Assistance Needed: total            OT Problem List: Decreased strength;Decreased activity tolerance;Decreased cognition;Impaired tone;Impaired UE functional use;Decreased range of motion;Impaired balance (sitting and/or standing);Decreased coordination      OT Treatment/Interventions:      OT Goals(Current goals  can be found in the care plan section) Acute Rehab OT Goals Patient Stated Goal: go back to SNF per pt OT Goal Formulation: With patient  OT Frequency:     Barriers to D/C:    LTC resident of a SNF       Co-evaluation              AM-PAC PT "6 Clicks" Daily Activity     Outcome Measure Help from another person eating meals?: A Little Help from another person taking care of personal grooming?: A Little Help from another person toileting, which includes using toliet, bedpan, or urinal?: Total Help from another person bathing (including washing, rinsing, drying)?: Total Help from another person to put on and taking off regular upper body clothing?: Total Help from another person to put on and taking off regular lower body clothing?: Total 6 Click Score: 10   End of Session    Activity Tolerance: Patient tolerated treatment well Patient left: in bed;with call bell/phone within reach  OT Visit Diagnosis: Other abnormalities of gait and mobility (R26.89);Muscle weakness (generalized) (M62.81);Hemiplegia and hemiparesis;Other symptoms and signs involving the nervous system (R29.898);Cognitive communication deficit (R41.841) Symptoms and signs involving cognitive functions: Cerebral infarction Hemiplegia - Right/Left: Left Hemiplegia - dominant/non-dominant: Non-Dominant Hemiplegia - caused by: Cerebral infarction                Time: 8841-6606 OT Time Calculation (min): 26 min Charges:  OT General Charges $OT Visit: 1 Visit OT Evaluation $OT Eval Moderate Complexity: 1 Mod OT Treatments $Therapeutic Activity: 8-22 mins G-Codes: OT G-codes **NOT FOR INPATIENT CLASS** Functional Assessment Tool Used: AM-PAC 6 Clicks Daily Activity     Britt Bottom 03/24/2017, 2:44 PM

## 2017-03-24 NOTE — Care Management Important Message (Signed)
Important Message  Patient Details  Name: [name] [name] MRN: [id number] Date of Birth: [birth year]   Medicare Important Message Given:  Yes    Bethena Roys, RN 03/24/2017, 2:30 PM

## 2017-03-24 NOTE — Progress Notes (Signed)
  Speech Language Pathology Treatment: Dysphagia  Patient Details Name: [name] [name] MRN: [id number] DOB: Jan 30, 1945 Today's Date: 03/24/2017 Time: 7026-3785 SLP Time Calculation (min) (ACUTE ONLY): 16 min  Assessment / Plan / Recommendation Clinical Impression  Pt needed mild verbal encouragement and clinical reasoning to sit in upright position. Recalled chin tuck, verbally prompted for throat clear strategy with cues for coordination re: chin tuck. Placed written reminder in pt's view for strategies. Immediate cough with first sip thin out of 6 trial which is expected with chronic dysphagia. Orally manipulated cracker without difficulty. Would recommend continued ST briefly for continued education and observation.    HPI HPI: 72 year old male admitted from SNF with respiratory distress. Chest x-ray is notable for moderate cardiomegaly with diffuse hazy opacities throughout bilateral lungs and medium pleural effusions consistent with CHF, but with pneumonia difficult to exclude. On abx to cover for aspiration PNA. SLP consult ordered. PMH of CVA with resultant dysphagia, CHF, HTN, GERD, PEG placement. Per patient and daughter, consuming both nectar thick and thin liquids at SNF prior to admission.       SLP Plan  Continue with current plan of care       Recommendations  Diet recommendations: Dysphagia 3 (mechanical soft);Thin liquid Liquids provided via: Cup;No straw Medication Administration: Crushed with puree Supervision: Patient able to self feed Compensations: Slow rate;Small sips/bites;Clear throat intermittently;Chin tuck Postural Changes and/or Swallow Maneuvers: Seated upright 90 degrees                Oral Care Recommendations: Oral care BID Follow up Recommendations: Skilled Nursing facility SLP Visit Diagnosis: Dysphagia, oropharyngeal phase (R13.12) Plan: Continue with current plan of care                      Houston Siren 03/24/2017, 4:07  PM  Orbie Pyo Colvin Caroli.[employer] [phone]

## 2017-03-24 NOTE — Progress Notes (Signed)
Palliative Medicine consult noted. Due to high referral volume, there may be a delay seeing this patient. Please call the Palliative Medicine Team office at [phone] if recommendations are needed in the interim.  Thank you for inviting [place] to see this patient.  Marjie Skiff Athen Riel, RN, BSN, [facility] Palliative Medicine Team 03/24/2017 9:20 AM Office [phone]

## 2017-03-25 ENCOUNTER — Encounter (HOSPITAL_COMMUNITY): Payer: Self-pay | Admitting: Physician Assistant

## 2017-03-25 DIAGNOSIS — I480 Paroxysmal atrial fibrillation: Secondary | ICD-10-CM

## 2017-03-25 DIAGNOSIS — I5023 Acute on chronic systolic (congestive) heart failure: Secondary | ICD-10-CM

## 2017-03-25 LAB — BASIC METABOLIC PANEL
ANION GAP: 12 (ref 5–15)
BUN: 13 mg/dL (ref 6–20)
CALCIUM: 9.7 mg/dL (ref 8.9–10.3)
CO2: 29 mmol/L (ref 22–32)
CREATININE: 1.3 mg/dL — AB (ref 0.61–1.24)
Chloride: 102 mmol/L (ref 101–111)
GFR, EST NON AFRICAN AMERICAN: 54 mL/min — AB (ref >60)
GLUCOSE: 89 mg/dL (ref 65–99)
Potassium: 3.4 mmol/L — ABNORMAL LOW (ref 3.5–5.1)
Sodium: 143 mmol/L (ref 135–145)

## 2017-03-25 LAB — CBC
HCT: 39.8 % (ref 39.0–52.0)
HEMOGLOBIN: 12.6 g/dL — AB (ref 13.0–17.0)
MCH: 24.3 pg — ABNORMAL LOW (ref 26.0–34.0)
MCHC: 31.7 g/dL (ref 30.0–36.0)
MCV: 76.7 fL — ABNORMAL LOW (ref 78.0–100.0)
PLATELETS: 320 10*3/uL (ref 150–400)
RBC: 5.19 MIL/uL (ref 4.22–5.81)
RDW: 16.5 % — ABNORMAL HIGH (ref 11.5–15.5)
WBC: 6.2 10*3/uL (ref 4.0–10.5)

## 2017-03-25 MED ORDER — SACUBITRIL-VALSARTAN 24-26 MG PO TABS
1.0000 | ORAL_TABLET | Freq: Two times a day (BID) | ORAL | Status: DC
  Administered 2017-03-25 – 2017-03-28 (×6): 1 via ORAL
  Filled 2017-03-25 (×7): qty 1

## 2017-03-25 MED ORDER — POTASSIUM CHLORIDE 20 MEQ PO PACK
40.0000 meq | PACK | Freq: Once | ORAL | Status: AC
  Administered 2017-03-25: 40 meq via ORAL
  Filled 2017-03-25: qty 2

## 2017-03-25 NOTE — Progress Notes (Addendum)
PROGRESS NOTE    [name] [name]  ZCH:[id number] DOB: 07-May-1945 DOA: 03/22/2017 PCP: Garwin Brothers, MD   Brief Narrative by Dr Myna Hidalgo: "[name] [name] is a 72 y.o. male with medical history significant for chronic systolic CHF, hypertension, paroxysmal atrial fibrillation, and history of CVA with hemiplegia, now presenting from his SNF for evaluation of respiratory distress.  Patient reports that he developed shortness of breath beginning 2 weeks ago and has progressively worsened since that time.  He reports an occasional cough, sometimes productive of scant thick sputum.  He reportedly had a fever at the nursing facility and was treated with Tylenol prior to transfer to the ED.  He denies chest pain or palpitations.  Upon EMS arrival, patient was reportedly saturating 86% on 4 L/min of supplemental oxygen.  He does not require supplemental oxygen at baseline".  "Upon arrival to the ED, patient is found to be afebrile, saturating mid 90s with 6 L/min of supplemental oxygen, and with vitals otherwise normal.  EKG features a sinus rhythm with PAC and nonspecific IVCD.  Chest x-ray is notable for moderate cardiomegaly with diffuse hazy opacities throughout bilateral lungs and medium pleural effusions consistent with CHF, but with pneumonia difficult to exclude.  Chemistry panel reveals a creatinine of 1.23, slightly up from priors.  CBC is notable for a stable chronic thrombocytosis with platelets 432,000.  Lactic acid is elevated to 2.34, troponin is within normal limits, and BNP is 1180".   Assessment & Plan:   Principal Problem:   Acute on chronic systolic CHF (congestive heart failure) (HCC) Active Problems:   Acute respiratory failure with hypoxia (HCC)   CVA, old, aphasia   Mild renal insufficiency   AF (paroxysmal atrial fibrillation) (HCC)   1-Acute hypoxic Respiratory Failure; Secondary to acute on chronic systolic HF exacerbation;  Pneumonia.  Chest x ray; moderated cardiomegaly with  diffuse opacity, bilateral pleural effusion. Could be HF, pneumonia difficult to exclude.  Influenza negative, blood cultures; growing staph, methicillin resistant anaerobic and aerobic bottle.  Weight; 187---190. Prior weight 240 pd on 2017.   Strep pneumonia antigen negative and legionella antigen negative.  ECHO showed Ef 20 % diastolic dysfunction.  Continue with lasix IV BID.  Chest x ray with HF  Continue with IV Unasyn.    2-Lactic acidosis. PNA,  Continue with Unasyn to cover for aspiration PNA. Added vancomycin due to MRSA PCR positive, nursing home resident and Staph Bacteremia.  Speech swallow evaluation. At risk for aspiration Lactic acid trending down.  Staph methicillin resistance 1 set of 2 blood culture . Discussed with Dr Linus Salmons. Likely contaminate. Will stop vancomycin. Repeat blood cultures.  He is at risk for aspiration, he wants to continue with diet. Refuse use Peg tube for nutrition. I will consult Palliative for goals of care and further discussion with patient and family.   Acute systolic and diastolic  HF exacerbation;  No prior ECHO to compare.  Continue with IV lasix.  Will defer to cardio start ACE, Cr at 1.3.  Negative 3.4. Cardiology consulted.  I reviewed Records last ECHO 2017 Ef 40%. Son, who is a physician, would like to know next step in management, for newly decrease EF from cardiology ? .   Paroxysmal A fib.  EKG sinus rhythm on admission.  Continue with amiodarone.   History of CVA with hemiparesis;  continue with aspirin, lipitor.   Mild AKI; received fluids. Monitor on lasix.      DVT prophylaxis: Lovenox.  Code Status: Discussed with patient  CODE status again, son thought patient was full code while patient is in the hospital , DNR out facility. Son is also [place] with patient decisions regarding diet and code status. I came back and I spoke with patient, he relates that he wants to medication, CPR and defibrillation. He does not wants to be  intubated. He is willing to speak with palliative care further.  Family Communication: Care discussed with patient.  Disposition Plan: back to SNF when respiratory status improved.   Consultants:      Procedures:  ECHO---    Antimicrobials:   Unasyn 3-13.   Subjective: He is feeling ok, breathing Better.  Denies chest pain.   Objective: Vitals:   03/25/17 0452 03/25/17 0821 03/25/17 0933 03/25/17 1258  BP: (!) 148/84 (!) 157/80  135/73  Pulse: 84  69   Resp: 19     Temp: 99 F (37.2 C) 97.8 F (36.6 C)  97.8 F (36.6 C)  TempSrc: Axillary Axillary  Oral  SpO2: 92% 92%  96%  Weight:      Height:        Intake/Output Summary (Last 24 hours) at 03/25/2017 1457 Last data filed at 03/25/2017 1238 Gross per 24 hour  Intake 800 ml  Output 4150 ml  Net -3350 ml   Filed Weights   03/23/17 0442 03/24/17 0429 03/25/17 0219  Weight: 86.3 kg (190 lb 4.1 oz) 86.8 kg (191 lb 5.8 oz) 82.6 kg (182 lb 1.6 oz)    Examination:  General exam: NAD Respiratory system: Normal respiratory effort, Bilateral crackles.  Cardiovascular system: S 1, S 2 RRR Gastrointestinal system; BS present, soft, Peg tube in place.  Central nervous system: Left side hemiparesis.  Extremities: Trace edema.  Skin: no rash     Data Reviewed: I have personally reviewed following labs and imaging studies  CBC: Recent Labs  Lab 03/22/17 0252 03/23/17 0852 03/24/17 0704 03/25/17 0437  WBC 7.9 5.9 8.1 6.2  NEUTROABS 5.1  --   --   --   HGB 13.7 11.8* 13.2 12.6*  HCT 42.3 37.1* 41.2 39.8  MCV 76.6* 75.6* 76.6* 76.7*  PLT 432* 337 363 188   Basic Metabolic Panel: Recent Labs  Lab 03/22/17 0252 03/23/17 0559 03/24/17 0704 03/25/17 0437  NA 141 143 144 143  K 3.9 4.0 3.3* 3.4*  CL 107 108 106 102  CO2 20* 25 27 29   GLUCOSE 124* 111* 93 89  BUN 9 14 16 13   CREATININE 1.23 1.19 1.35* 1.30*  CALCIUM 10.1 10.0 10.3 9.7   GFR: Estimated Creatinine Clearance: 55.5 mL/min (A) (by C-G  formula based on SCr of 1.3 mg/dL (H)). Liver Function Tests: Recent Labs  Lab 03/22/17 0252  AST 21  ALT 15*  ALKPHOS 113  BILITOT 0.9  PROT 6.6  ALBUMIN 3.0*   No results for input(s): LIPASE, AMYLASE in the last 168 hours. No results for input(s): AMMONIA in the last 168 hours. Coagulation Profile: No results for input(s): INR, PROTIME in the last 168 hours. Cardiac Enzymes: No results for input(s): CKTOTAL, CKMB, CKMBINDEX, TROPONINI in the last 168 hours. BNP (last 3 results) No results for input(s): PROBNP in the last 8760 hours. HbA1C: No results for input(s): HGBA1C in the last 72 hours. CBG: Recent Labs  Lab 03/23/17 1137  GLUCAP 111*   Lipid Profile: No results for input(s): CHOL, HDL, LDLCALC, TRIG, CHOLHDL, LDLDIRECT in the last 72 hours. Thyroid Function Tests: No results for input(s): TSH, T4TOTAL, FREET4, T3FREE, THYROIDAB  in the last 72 hours. Anemia Panel: No results for input(s): VITAMINB12, FOLATE, FERRITIN, TIBC, IRON, RETICCTPCT in the last 72 hours. Sepsis Labs: Recent Labs  Lab 03/22/17 1044 03/22/17 1336 03/22/17 1842 03/22/17 2137 03/23/17 0559  PROCALCITON  --   --   --   --  0.28  LATICACIDVEN 3.4* 3.7* 2.6* 1.9  --     Recent Results (from the past 240 hour(s))  Culture, blood (routine x 2)     Status: None (Preliminary result)   Collection Time: 03/22/17  2:44 AM  Result Value Ref Range Status   Specimen Description BLOOD LEFT HAND  Final   Special Requests IN PEDIATRIC BOTTLE Blood Culture adequate volume  Final   Culture   Final    NO GROWTH 2 DAYS Performed at [facility], [place] [address], [place], [place] [address]    Report Status PENDING  Incomplete  Culture, blood (routine x 2)     Status: Abnormal   Collection Time: 03/22/17  4:08 AM  Result Value Ref Range Status   Specimen Description BLOOD RIGHT ARM  Final   Special Requests   Final    BOTTLES DRAWN AEROBIC AND ANAEROBIC Blood Culture adequate volume    Culture  Setup Time   Final    GRAM POSITIVE COCCI IN BOTH AEROBIC AND ANAEROBIC BOTTLES CRITICAL RESULT CALLED TO, READ BACK BY AND VERIFIED WITH: V BRYK PHARMD 03/23/17 0225 JDW    Culture (A)  Final    STAPHYLOCOCCUS SPECIES (COAGULASE NEGATIVE) THE SIGNIFICANCE OF ISOLATING THIS ORGANISM FROM A SINGLE SET OF BLOOD CULTURES WHEN MULTIPLE SETS ARE DRAWN IS UNCERTAIN. PLEASE NOTIFY THE MICROBIOLOGY DEPARTMENT WITHIN ONE WEEK IF SPECIATION AND SENSITIVITIES ARE REQUIRED. Performed at [facility], [place] [address], [place], [place] [address]    Report Status 03/24/2017 FINAL  Final  Blood Culture ID Panel (Reflexed)     Status: Abnormal   Collection Time: 03/22/17  4:08 AM  Result Value Ref Range Status   Enterococcus species NOT DETECTED NOT DETECTED Final   Listeria monocytogenes NOT DETECTED NOT DETECTED Final   Staphylococcus species DETECTED (A) NOT DETECTED Final    Comment: Methicillin (oxacillin) resistant coagulase negative staphylococcus. Possible blood culture contaminant (unless isolated from more than one blood culture draw or clinical case suggests pathogenicity). No antibiotic treatment is indicated for blood  culture contaminants. CRITICAL RESULT CALLED TO, READ BACK BY AND VERIFIED WITH: V BRYK PHARMD 03/23/17 0225 JDW    Staphylococcus aureus NOT DETECTED NOT DETECTED Final   Methicillin resistance DETECTED (A) NOT DETECTED Final    Comment: CRITICAL RESULT CALLED TO, READ BACK BY AND VERIFIED WITH: V BRYK PHARMD 03/23/17 0225 JDW    Streptococcus species NOT DETECTED NOT DETECTED Final   Streptococcus agalactiae NOT DETECTED NOT DETECTED Final   Streptococcus pneumoniae NOT DETECTED NOT DETECTED Final   Streptococcus pyogenes NOT DETECTED NOT DETECTED Final   Acinetobacter baumannii NOT DETECTED NOT DETECTED Final   Enterobacteriaceae species NOT DETECTED NOT DETECTED Final   Enterobacter cloacae complex NOT DETECTED NOT DETECTED Final   Escherichia coli NOT  DETECTED NOT DETECTED Final   Klebsiella oxytoca NOT DETECTED NOT DETECTED Final   Klebsiella pneumoniae NOT DETECTED NOT DETECTED Final   Proteus species NOT DETECTED NOT DETECTED Final   Serratia marcescens NOT DETECTED NOT DETECTED Final   Haemophilus influenzae NOT DETECTED NOT DETECTED Final   Neisseria meningitidis NOT DETECTED NOT DETECTED Final   Pseudomonas aeruginosa NOT DETECTED NOT  DETECTED Final   Candida albicans NOT DETECTED NOT DETECTED Final   Candida glabrata NOT DETECTED NOT DETECTED Final   Candida krusei NOT DETECTED NOT DETECTED Final   Candida parapsilosis NOT DETECTED NOT DETECTED Final   Candida tropicalis NOT DETECTED NOT DETECTED Final    Comment: Performed at [facility], [place] [address], [place], [place] [address]  Urine culture     Status: None   Collection Time: 03/22/17  4:10 AM  Result Value Ref Range Status   Specimen Description URINE, CATHETERIZED  Final   Special Requests NONE  Final   Culture   Final    NO GROWTH Performed at [facility], [address], [place], [place] [address]    Report Status 03/23/2017 FINAL  Final  MRSA PCR Screening     Status: Abnormal   Collection Time: 03/22/17  1:54 PM  Result Value Ref Range Status   MRSA by PCR POSITIVE (A) NEGATIVE Final    Comment:        The GeneXpert MRSA Assay (FDA approved for NASAL specimens only), is one component of a comprehensive MRSA colonization surveillance program. It is not intended to diagnose MRSA infection nor to guide or monitor treatment for MRSA infections. RESULT CALLED TO, READ BACK BY AND VERIFIED WITH: K.WEST,RN AT 1635 BY L.PITT 03/22/17          Radiology Studies: No results found.      Scheduled Meds: . amiodarone  200 mg Per Tube Daily  . aspirin  81 mg Per Tube Daily  . atorvastatin  40 mg Per Tube q1800  . calcium-vitamin D  1 tablet Per Tube Q breakfast  . Chlorhexidine Gluconate Cloth  6 each Topical Q0600  . enoxaparin  (LOVENOX) injection  40 mg Subcutaneous Q24H  . famotidine  20 mg Per Tube Daily  . furosemide  40 mg Intravenous Q12H  . gabapentin  300 mg Per Tube QHS  . guaiFENesin  15 mL Per Tube Q6H  . magnesium oxide  400 mg Per Tube BID  . mouth rinse  15 mL Mouth Rinse BID  . metoprolol tartrate  12.5 mg Per Tube BID  . multivitamin with minerals  1 tablet Per Tube Daily  . mupirocin ointment  1 application Nasal BID  . sodium chloride flush  3 mL Intravenous Q12H  . vitamin C  500 mg Oral Daily   Continuous Infusions: . sodium chloride    . ampicillin-sulbactam (UNASYN) IV Stopped (03/25/17 1030)     LOS: 3 days    Time spent: 35 minutes.     Elmarie Shiley, MD [facility] Pager [phone]  If 7PM-7AM, please contact night-coverage [email or web address] Password [facility] 03/25/2017, 2:57 PM

## 2017-03-25 NOTE — Consult Note (Addendum)
Cardiology Consultation:   Patient ID: [name] [name]; [id number]; [birth year]   Admit date: 03/22/2017 Date of Consult: 03/25/2017  Primary Care Provider: Garwin Brothers, MD Primary Cardiologist: New Primary Electrophysiologist:  n/a   Patient Profile:   [name] [name] is a 72 y.o. male with a hx of S-CHF, DM, HTN, PAF, hemorrhagic CVA w/ hemiplegia, PAD w/ stents to the R subclavian and R-EIA, who is being seen today for the evaluation of CHF at the request of Dr [name] [name].  History of Present Illness:   Mr [name] [name] was admitted 03/13 with increasing SOB. Dx hypoxic respiratory failure and possible PNA.  Mr. [name] [name] lives in a facility. His son is an MD and helps in his care.   Mr [name] [name] is oriented to name, day and place. Says he lives in [place] and his heart doctor is in [place]. Limited info available from the patient, the rest is from staff and chart notes. No family present.  Pt states he has been SOB for a few years. It got worse recently. He is not on chronic O2. He is breathing better since being in the hospital.   He is not sure how long his SOB has been worse. He says he has LE edema. He denies chest pain.    Past Medical History:  Diagnosis Date  . Chronic systolic CHF (congestive heart failure) ([facility])   . CVA, old, aphasia   . Diabetes ([place])   . Hemiplegia ([place])   . HTN (hypertension)   . Intestinal obstruction ([place]) 11/2015  . Peripheral vascular disease ([place])   . Pneumonia     Past Surgical History:  Procedure Laterality Date  . ABDOMINAL ADHESION SURGERY  11/2015  . PERIPHERAL VASCULAR CATHETERIZATION     R-EIA stent, possibly R-Subclavian stent 2017     Prior to Admission medications   Medication Sig Start Date End Date Taking? Authorizing Provider  acetaminophen (TYLENOL) 325 MG tablet Place 650 mg into feeding tube every 6 (six) hours as needed for mild pain.   Yes [provider]  amiodarone (PACERONE) 200 MG tablet Place 200 mg into feeding tube  daily.   Yes [provider]  aspirin 81 MG chewable tablet Place 81 mg into feeding tube daily.   Yes [provider]  atorvastatin (LIPITOR) 40 MG tablet Place 40 mg into feeding tube daily.   Yes [provider]  calcium-vitamin D (OSCAL WITH D) 500-200 MG-UNIT tablet Place 1 tablet into feeding tube daily with breakfast.   Yes [provider]  famotidine (PEPCID) 20 MG tablet Place 20 mg into feeding tube daily.   Yes [provider]  ferrous sulfate 300 (60 Fe) MG/5ML syrup Place 300 mg into feeding tube 2 (two) times daily with a meal.   Yes [provider]  furosemide (LASIX) 20 MG tablet Place 20 mg into feeding tube daily.   Yes [provider]  gabapentin (NEURONTIN) 300 MG capsule Place 300 mg into feeding tube at bedtime.   Yes [provider]  losartan (COZAAR) 25 MG tablet Place 25 mg into feeding tube daily. Hold if SBP <120, or DBP <70   Yes [provider]  magnesium oxide (MAG-OX) 400 MG tablet Place 400 mg into feeding tube 2 (two) times daily.   Yes [provider]  Menthol, Topical Analgesic, (BIOFREEZE) 4 % GEL Apply topically 2 (two) times daily. To left knee   Yes [provider]  metoprolol tartrate (LOPRESSOR) 25 MG tablet Place 12.5 mg into  feeding tube 2 (two) times daily.   Yes [provider]  Multiple Vitamin (MULTIVITAMIN WITH MINERALS) TABS tablet Place 1 tablet into feeding tube daily.   Yes [provider]  vitamin C (ASCORBIC ACID) 500 MG tablet Take 500 mg by mouth daily.   Yes [provider]  Vitamin D, Ergocalciferol, (DRISDOL) 50000 units CAPS capsule Take 50,000 Units by mouth every 30 (thirty) days.   Yes [provider]    Inpatient Medications: Scheduled Meds: . amiodarone  200 mg Per Tube Daily  . aspirin  81 mg Per Tube Daily  . atorvastatin  40 mg Per Tube q1800  . calcium-vitamin D  1 tablet Per Tube Q breakfast    . Chlorhexidine Gluconate Cloth  6 each Topical Q0600  . enoxaparin (LOVENOX) injection  40 mg Subcutaneous Q24H  . famotidine  20 mg Per Tube Daily  . furosemide  40 mg Intravenous Q12H  . gabapentin  300 mg Per Tube QHS  . guaiFENesin  15 mL Per Tube Q6H  . magnesium oxide  400 mg Per Tube BID  . mouth rinse  15 mL Mouth Rinse BID  . metoprolol tartrate  12.5 mg Per Tube BID  . multivitamin with minerals  1 tablet Per Tube Daily  . mupirocin ointment  1 application Nasal BID  . potassium chloride  40 mEq Oral Once  . sodium chloride flush  3 mL Intravenous Q12H  . vitamin C  500 mg Oral Daily   Continuous Infusions: . sodium chloride    . ampicillin-sulbactam (UNASYN) IV Stopped (03/25/17 1030)   PRN Meds: sodium chloride, acetaminophen, ALPRAZolam, MUSCLE RUB, ondansetron (ZOFRAN) IV, sodium chloride flush, zolpidem  Allergies:   No Known Allergies  Social History:   Social History   Socioeconomic History  . Marital status: Married    Spouse name: Not on file  . Number of children: Not on file  . Years of education: Not on file  . Highest education level: Not on file  Social Needs  . Financial resource strain: Not on file  . Food insecurity - worry: Not on file  . Food insecurity - inability: Not on file  . Transportation needs - medical: Not on file  . Transportation needs - non-medical: Not on file  Occupational History  . Occupation: Retired  Tobacco Use  . Smoking status: Former Research scientist (life sciences)  . Smokeless tobacco: Never Used  Substance and Sexual Activity  . Alcohol use: No    Frequency: Never  . Drug use: No  . Sexual activity: No  Other Topics Concern  . Not on file  Social History Narrative   Pt lives in a facility.     Family History:   Family History  Problem Relation Age of Onset  . CAD Neg Hx    Family Status:  Family Status  Relation Name Status  . Mother  Deceased  . Father  Deceased  . Son  Alive  . Neg Hx  (Not Specified)    ROS:   Please see the history of present illness.  All other ROS reviewed and negative.     Physical Exam/Data:   Vitals:   03/25/17 0452 03/25/17 0821 03/25/17 0933 03/25/17 1258  BP: (!) 148/84 (!) 157/80  135/73  Pulse: 84  69   Resp: 19     Temp: 99 F (37.2 C) 97.8 F (36.6 C)  97.8 F (36.6 C)  TempSrc: Axillary Axillary  Oral  SpO2: 92% 92%  96%  Weight:      Height:        Intake/Output Summary (Last 24 hours) at 03/25/2017 1648 Last data filed at 03/25/2017 1238 Gross per 24 hour  Intake 800 ml  Output 2650 ml  Net -1850 ml   Filed Weights   03/23/17 0442 03/24/17 0429 03/25/17 0219  Weight: 190 lb 4.1 oz (86.3 kg) 191 lb 5.8 oz (86.8 kg) 182 lb 1.6 oz (82.6 kg)   Body mass index is 25.4 kg/m.  General:  Well nourished, well developed, in no acute distress HEENT: normal Lymph: no adenopathy Neck: no JVD Endocrine:  No thryomegaly Vascular: No carotid bruits; upper extremity pulses 2+, LE pulses palpable but decreased  Cardiac:  normal S1, S2; RRR; no murmur  Lungs:  Decreased BS bases bilaterally, no wheezing, rhonchi or rales  Abd: soft, nontender, no hepatomegaly  Ext: no edema Musculoskeletal:  No deformities, BUE and BLE strength unchanged from baseline Skin: warm and dry  Neuro:  Chronic L weakness Psych:  Normal affect   EKG:  The EKG was personally reviewed and demonstrates:  SR, HR 86, QRS duration 127 ms, consider atypical LBBB Telemetry:  Telemetry was personally reviewed and demonstrates:  SR  Relevant CV Studies:  ECHO: 03/24/2017 - Left ventricle: The cavity size was mildly dilated. There was   mild concentric hypertrophy. Systolic function was severely   reduced. The estimated ejection fraction was in the range of 20%   to 25%. Diffuse hypokinesis. Features are consistent with a   pseudonormal left ventricular filling pattern, with concomitant   abnormal relaxation and increased filling pressure (grade 2   diastolic dysfunction). Doppler  parameters are consistent with   elevated ventricular end-diastolic filling pressure. - Ventricular septum: Septal motion showed paradox. - Mitral valve: There was mild regurgitation. - Left atrium: The atrium was mildly dilated. - Right ventricle: Systolic function was normal. - Tricuspid valve: There was trivial regurgitation. - Pulmonary arteries: Systolic pressure was within the normal   range. - Pericardium, extracardiac: There was a left pleural effusion. Impressions: - No prior study available for comparison.  ECHO: 12/16/2015 Conclusions Summary Mild concentric left ventricular hypertrophy Mild global LV hypokinesis. Ejection fraction is visually estimated at 40-45% Mildly Dilation of the aortic root and ascending aorta by this study No significant valvular abnormalites noted. Findings Mitral Valve Normal mitral valve structure and mobility. No significant regurgitation. Aortic Valve Trileaflet valve with mild sclerosis, good mobility, no regurgitation and no evidence of stenosis. Tricuspid Valve Tricuspid valve is structurally normal. No significant tricuspid regurgitation . Pulmonic Valve The pulmonic valve was not well visualized No Doppler evidence of pulmonic stenosis or insufficiency. Left Atrium Normal left atrium. Left Ventricle Mild concentric left ventricular hypertrophy Mild global LV hypokinesis. Ejection fraction is visually estimated at 40-45% Right Atrium Normal right atrium. Right Ventricle Normal right ventricle structure and function. Pericardial Effusion No evidence of pericardial effusion. Miscellaneous Mildly Dilation of the aortic root and ascending aorta. The IVC is normal M-Mode/2D Measurements & Calculations  LV Diastolic Dimension:  LV Systolic Dimension:   LA Dimension: 4 cmAO Root  4.66 cm                  3.57 cm                  Dimension: 4.6 cmLA Area:  LV FS:23.39 %            LV Volume Diastolic: 811 91.4 cm2  LV PW  Diastolic:  1.33 cm ml  Septum Diastolic: 1.3 cm LV Volume Systolic: 21.2                           ml                           LV EDV/LV EDV Index: 101                           ml/44 m2LV ESV/LV ESV    LA/Aorta: 0.87                           Index: 45.5 ml/20 m2                           EF Calculated: 54.95 %   LA volume/Index: 88 ml                           EF Estimated: 45 %       /60m2 Doppler Measurements & Calculations  MV Peak E-Wave: 74.2 cm/s AV Peak Velocity: 166  MV Peak A-Wave: 134 cm/s  cm/s  MV E/A Ratio: 0.55        AV Peak Gradient: 11.02  MV Peak Gradient: 2.2     mmHg  mmHg                                                     PV Peak Velocity: 140                                                     cm/s  TDI E' Velocity: 7.4 cm/s                          PV Peak Gradient: 7.84                                                     mmHg  E/E' prime 10  Laboratory Data:  Chemistry Recent Labs  Lab 03/23/17 0559 03/24/17 0704 03/25/17 0437  NA 143 144 143  K 4.0 3.3* 3.4*  CL 108 106 102  CO2 25 27 29   GLUCOSE 111* 93 89  BUN 14 16 13   CREATININE 1.19 1.35* 1.30*  CALCIUM 10.0 10.3 9.7  GFRNONAA 60* 51* 54*  GFRAA >60 59* >60  ANIONGAP 10 11 12     Lab Results  Component Value Date   ALT 15 (L) 03/22/2017   AST 21 03/22/2017   ALKPHOS 113 03/22/2017   BILITOT 0.9 03/22/2017   Hematology Recent Labs  Lab 03/23/17 0852 03/24/17 0704 03/25/17 0437  WBC 5.9 8.1 6.2  RBC 4.91 5.38 5.19  HGB 11.8* 13.2 12.6*  HCT 37.1* 41.2 39.8  MCV 75.6* 76.6* 76.7*  MCH 24.0*  24.5* 24.3*  MCHC 31.8 32.0 31.7  RDW 16.5* 17.0* 16.5*  PLT 337 363 320   Cardiac Enzymes  Recent Labs  Lab 03/22/17 0250  TROPIPOC 0.03    BNP Recent Labs  Lab 03/22/17 0252  BNP 1,180.9*     Radiology/Studies:  Dg Chest 1 View  Result Date: 03/22/2017 CLINICAL DATA:  Shortness of breath EXAM: CHEST  1 VIEW COMPARISON:  Chest radiograph 12/20/2015 FINDINGS: Bilateral  pleural effusions with lateral components. Moderate cardiomegaly. Diffuse hazy opacity throughout both lungs. IMPRESSION: Moderate cardiomegaly with diffuse hazy opacities throughout both lungs and medium-sized bilateral pleural effusions. This may indicate congestive heart failure. Pneumonia would be difficult to exclude. Electronically Signed   By: Ulyses Jarred M.D.   On: 03/22/2017 03:26   Dg Chest 2 View  Result Date: 03/23/2017 CLINICAL DATA:  Cough EXAM: CHEST - 2 VIEW COMPARISON:  03/22/2017 FINDINGS: Diffuse airspace disease and layering effusions. Cardiomegaly. Findings likely reflect moderate CHF. No real change since prior study. IMPRESSION: Bilateral airspace disease and layering effusions, most likely moderate CHF. No change. Electronically Signed   By: Rolm Baptise M.D.   On: 03/23/2017 10:56    Assessment and Plan:   Principal Problem: 1.  Acute on chronic combined systolic and diastolic CHF (congestive heart failure) (HCC) - newly low EF of 20% - no WMA mentioned, also w/ grade 2 diastolic dysfunction - volume status improved since admission, may be able to change to po Lasix tomorrow or Monday - Cr a little higher w/ diuresis, follow - discuss w/ MD adding Entresto and spironolactone. With Cr slightly higher would add low-dose Entresto first and follow labs - will also add Bidil - f/u as outpt for further med titration, recheck echo 3 months - hx PAD, but with no ischemic sx, defer ischemic eval for now.  Otherwise, per IM Active Problems:   Acute respiratory failure with hypoxia (HCC)   CVA, old, aphasia   Mild renal insufficiency   AF (paroxysmal atrial fibrillation) ([facility]) - maintaining SR   For questions or updates, please contact [facility] Please consult [email or web address] for contact info under Cardiology/STEMI.   Signed, Rosaria Ferries, PA-C  03/25/2017 4:48 PM  I have seen, examined the patient, and reviewed the above assessment and plan.  Changes to above  are made where necessary.  On exam, he is chronically quite ill.  Residual stroke deficits noted.  EF is chronically depressed but now worse.  Appears to be clinically improving with diuresis.  Will continue to optimize medical therapy while here.  Etiology for his depressed EF is unknown.  He has CAD risk factors but currently is without angina.  Options would include a conservative medical therapy approach, further risk stratification with myoview/ cardiac CT, or an invasive approach with cath.  Given his overall functional status, I would favor a conservative approach.  Unfortunately, the patient is not able to provide his opinion.  I think that it may be best to discuss with his family once available.  Cardiology to return tomorrow.  We will try to reach out to his family at that time.   Co Sign: Thompson Grayer, MD 03/25/2017 4:57 PM

## 2017-03-26 ENCOUNTER — Inpatient Hospital Stay (HOSPITAL_COMMUNITY): Payer: Medicare Other

## 2017-03-26 DIAGNOSIS — N289 Disorder of kidney and ureter, unspecified: Secondary | ICD-10-CM

## 2017-03-26 LAB — BASIC METABOLIC PANEL
ANION GAP: 9 (ref 5–15)
BUN: 13 mg/dL (ref 6–20)
CO2: 32 mmol/L (ref 22–32)
CREATININE: 1.47 mg/dL — AB (ref 0.61–1.24)
Calcium: 9.9 mg/dL (ref 8.9–10.3)
Chloride: 104 mmol/L (ref 101–111)
GFR calc non Af Amer: 46 mL/min — ABNORMAL LOW (ref >60)
GFR, EST AFRICAN AMERICAN: 54 mL/min — AB (ref >60)
Glucose, Bld: 143 mg/dL — ABNORMAL HIGH (ref 65–99)
POTASSIUM: 3.3 mmol/L — AB (ref 3.5–5.1)
SODIUM: 145 mmol/L (ref 135–145)

## 2017-03-26 MED ORDER — FUROSEMIDE 40 MG PO TABS
40.0000 mg | ORAL_TABLET | Freq: Every day | ORAL | Status: DC
  Administered 2017-03-26 – 2017-03-28 (×3): 40 mg via ORAL
  Filled 2017-03-26 (×3): qty 1

## 2017-03-26 MED ORDER — POTASSIUM CHLORIDE 20 MEQ PO PACK
20.0000 meq | PACK | Freq: Once | ORAL | Status: AC
  Administered 2017-03-26: 20 meq via ORAL
  Filled 2017-03-26: qty 1

## 2017-03-26 NOTE — Progress Notes (Addendum)
Progress Note   Subjective   No events overnight.  Currently receiving a bath.  No new concerns  Inpatient Medications    Scheduled Meds: . amiodarone  200 mg Per Tube Daily  . aspirin  81 mg Per Tube Daily  . atorvastatin  40 mg Per Tube q1800  . calcium-vitamin D  1 tablet Per Tube Q breakfast  . Chlorhexidine Gluconate Cloth  6 each Topical Q0600  . enoxaparin (LOVENOX) injection  40 mg Subcutaneous Q24H  . famotidine  20 mg Per Tube Daily  . furosemide  40 mg Oral Daily  . gabapentin  300 mg Per Tube QHS  . guaiFENesin  15 mL Per Tube Q6H  . magnesium oxide  400 mg Per Tube BID  . mouth rinse  15 mL Mouth Rinse BID  . metoprolol tartrate  12.5 mg Per Tube BID  . multivitamin with minerals  1 tablet Per Tube Daily  . mupirocin ointment  1 application Nasal BID  . sacubitril-valsartan  1 tablet Oral BID  . sodium chloride flush  3 mL Intravenous Q12H  . vitamin C  500 mg Oral Daily   Continuous Infusions: . sodium chloride    . ampicillin-sulbactam (UNASYN) IV 3 g (03/26/17 1010)   PRN Meds: sodium chloride, acetaminophen, ALPRAZolam, MUSCLE RUB, ondansetron (ZOFRAN) IV, sodium chloride flush, zolpidem   Vital Signs    Vitals:   03/26/17 0016 03/26/17 0442 03/26/17 0748 03/26/17 1011  BP: (!) 148/102 128/68 (!) 150/91 129/75  Pulse: 93 83 74 81  Resp: 17 20 19    Temp: 98.6 F (37 C) 98 F (36.7 C) 97.9 F (36.6 C)   TempSrc: Axillary Oral Oral   SpO2: 97% 92% 97%   Weight:  180 lb 1.9 oz (81.7 kg)    Height:        Intake/Output Summary (Last 24 hours) at 03/26/2017 1229 Last data filed at 03/26/2017 0445 Gross per 24 hour  Intake 480 ml  Output 2075 ml  Net -1595 ml   Filed Weights   03/24/17 0429 03/25/17 0219 03/26/17 0442  Weight: 191 lb 5.8 oz (86.8 kg) 182 lb 1.6 oz (82.6 kg) 180 lb 1.9 oz (81.7 kg)    Telemetry    Sinus with short NSVT - Personally Reviewed  Physical Exam   GEN- The patient is chronically ill and elderly appearing,  alert, receiving a bath Head- normocephalic, atraumatic Oropharynx- clear Lungs-  normal work of breathing Heart- Regular rate and rhythm  Skin- no rash or lesion Psych- flat affect Neuro- chronic residual deficits s/p stroke MS- diffuse muscle atrophy   Labs    Chemistry Recent Labs  Lab 03/22/17 0252  03/24/17 0704 03/25/17 0437 03/26/17 0408  NA 141   < > 144 143 145  K 3.9   < > 3.3* 3.4* 3.3*  CL 107   < > 106 102 104  CO2 20*   < > 27 29 32  GLUCOSE 124*   < > 93 89 143*  BUN 9   < > 16 13 13   CREATININE 1.23   < > 1.35* 1.30* 1.47*  CALCIUM 10.1   < > 10.3 9.7 9.9  PROT 6.6  --   --   --   --   ALBUMIN 3.0*  --   --   --   --   AST 21  --   --   --   --   ALT 15*  --   --   --   --  ALKPHOS 113  --   --   --   --   BILITOT 0.9  --   --   --   --   GFRNONAA 58*   < > 51* 54* 46*  GFRAA >60   < > 59* >60 54*  ANIONGAP 14   < > 11 12 9    < > = values in this interval not displayed.     Hematology Recent Labs  Lab 03/23/17 0852 03/24/17 0704 03/25/17 0437  WBC 5.9 8.1 6.2  RBC 4.91 5.38 5.19  HGB 11.8* 13.2 12.6*  HCT 37.1* 41.2 39.8  MCV 75.6* 76.6* 76.7*  MCH 24.0* 24.5* 24.3*  MCHC 31.8 32.0 31.7  RDW 16.5* 17.0* 16.5*  PLT 337 363 320    Cardiac EnzymesNo results for input(s): TROPONINI in the last 168 hours.  Recent Labs  Lab 03/22/17 0250  TROPIPOC 0.03        Assessment & Plan    1.  Acute on chronic combined systolic and diastolic heart failure. Elevation in creatinine noted.  Will convert lasix to oral Continue to optimize medical therapy as able (entrest recently started) Given poor functional status, I would advise a conservative approach going forward. No family currently available for discussions.  Cardiology will need to discuss with patient's son (a physician) on Monday  2. Acute renal failure Will back off on diuresis  3. Hypokalemia Per primary team  4. afib Stable on amiodarone Poor candidate for anticoagulation  currently   Thompson Grayer MD, [facility] 03/26/2017 12:29 PM

## 2017-03-26 NOTE — Consult Note (Signed)
Consultation Note Date: 03/26/2017   Patient Name: [name] [name]  DOB: [birth year]  MRN: [id number]  Age / Sex: 72 y.o., male  PCP: [name] Brothers, MD Referring Physician: Elmarie Shiley, MD  Reason for Consultation: Establishing goals of care  HPI/Patient Profile: 72 y.o. male  with past medical history of CVA with hemiplegia, systolic CHF, HTN, DM, PVD, and pneumonia admitted on 03/22/2017 with respiratory distress from SNF. Chest xray revealed moderate cardiomegaly with diffuse hazy opacities throughout bilateral lungs and medium pleural effusion consistent with CHF, pneumonia difficult to exclude. Lactic acid 2.34. BNP 1180. ECHO revealed EF 97% diastolic dysfunction. Cardiology following. Receiving antibiotics for aspiration pneumonia and staph bacteremia. At risk for aspiration. Declines feeding tube and wishes to continue with dysphagia diet. Palliative medicine consultation for goals of care.   Clinical Assessment and Goals of Care: I have reviewed medical records, discussed with care team and met with patient at bedside. Patient is awake and alert. He will answer questions but with short responses and appears irritable. Eating lunch with aid at bedside. Denies pain or discomfort.   Spoke with daughter, [name] [name] via telephone who lives in [place]. [name] is originally from [place], where is wife currently lives. He has other adult children throughout the country. His son [name] [name]) is a Engineer, drilling and lives in [place].   I introduced Palliative Medicine as specialized medical care for people living with serious illness. It focuses on providing relief from the symptoms and stress of a serious illness. The goal is to improve quality of life for both the patient and the family.  [name] [name] speaks of her father's baseline prior to hospitalization. He had a stroke in 2011 and again in 2017. He requires lift to transfer  from bed to chair and also requires assist with ADLs. He has been receiving pureed food at nursing facility. Appetite is great.  Discussed hospital diagnoses and interventions. [name] [name] speaks of her brother [name] [name]) as [name] but that she is in [place] and actively involved in his care. We discussed disease trajectory of heart failure, being chronic and progressive. At this time, managing conservatively with medications. Also discussed risk for recurrent aspiration leading to pneumonia. [name] [name] speaks of her father being "stubborn."   [name] [name] believes he is "weak" but "back to baseline" this admission. [name] [name] is happy with the care her father receives at the nursing facility. "Always clean and fed." She is hopeful he will continue to show improvement and with plan for discharge back to [facility].  Questions and concerns addressed. PMT contact information given.    SUMMARY OF RECOMMENDATIONS    Limited code per patient wishes.   Continue current medical management.   Spoke with daughter via telephone. Discussed disease trajectory of CHF and also hx of CVA's with aspiration risk.   May benefit from continued support from outpatient palliative at SNF for ongoing [place] conversations.   Code Status/Advance Care Planning:  Limited code  Symptom Management:   Per attending  Palliative Prophylaxis:   Aspiration, Delirium Protocol and Turn  Reposition  Additional Recommendations (Limitations, Scope, Preferences):  Full Scope Treatment  Psycho-social/Spiritual:   Desire for further Chaplaincy support:yes  Additional Recommendations: Caregiving  Support/Resources  Prognosis:   Unable to determine  Discharge Planning: To Be Determined      Primary Diagnoses: Present on Admission: . Acute respiratory failure with hypoxia ([place]) . Acute on chronic systolic CHF (congestive heart failure) ([place]) . AF (paroxysmal atrial fibrillation) ([place])   I have reviewed the medical  record, interviewed the patient and family, and examined the patient. The following aspects are pertinent.  Past Medical History:  Diagnosis Date  . Chronic systolic CHF (congestive heart failure) ([place])   . CVA, old, aphasia   . Diabetes ([place])   . Hemiplegia ([place])   . HTN (hypertension)   . Intestinal obstruction ([place]) 11/2015  . Peripheral vascular disease ([place])   . Pneumonia    Social History   Socioeconomic History  . Marital status: Married    Spouse name: None  . Number of children: None  . Years of education: None  . Highest education level: None  Social Needs  . Financial resource strain: None  . Food insecurity - worry: None  . Food insecurity - inability: None  . Transportation needs - medical: None  . Transportation needs - non-medical: None  Occupational History  . Occupation: Retired  Tobacco Use  . Smoking status: Former Research scientist (life sciences)  . Smokeless tobacco: Never Used  Substance and Sexual Activity  . Alcohol use: No    Frequency: Never  . Drug use: No  . Sexual activity: No  Other Topics Concern  . None  Social History Narrative   Pt lives in a facility.    Family History  Problem Relation Age of Onset  . CAD Neg Hx    Scheduled Meds: . amiodarone  200 mg Per Tube Daily  . aspirin  81 mg Per Tube Daily  . atorvastatin  40 mg Per Tube q1800  . calcium-vitamin D  1 tablet Per Tube Q breakfast  . Chlorhexidine Gluconate Cloth  6 each Topical Q0600  . enoxaparin (LOVENOX) injection  40 mg Subcutaneous Q24H  . famotidine  20 mg Per Tube Daily  . furosemide  40 mg Oral Daily  . gabapentin  300 mg Per Tube QHS  . guaiFENesin  15 mL Per Tube Q6H  . magnesium oxide  400 mg Per Tube BID  . mouth rinse  15 mL Mouth Rinse BID  . metoprolol tartrate  12.5 mg Per Tube BID  . multivitamin with minerals  1 tablet Per Tube Daily  . mupirocin ointment  1 application Nasal BID  . sacubitril-valsartan  1 tablet Oral BID  . sodium chloride flush  3 mL Intravenous Q12H    . vitamin C  500 mg Oral Daily   Continuous Infusions: . sodium chloride    . ampicillin-sulbactam (UNASYN) IV 3 g (03/26/17 1010)   PRN Meds:.sodium chloride, acetaminophen, ALPRAZolam, MUSCLE RUB, ondansetron (ZOFRAN) IV, sodium chloride flush, zolpidem Medications Prior to Admission:  Prior to Admission medications   Medication Sig Start Date End Date Taking? Authorizing Provider  acetaminophen (TYLENOL) 325 MG tablet Place 650 mg into feeding tube every 6 (six) hours as needed for mild pain.   Yes [provider]  amiodarone (PACERONE) 200 MG tablet Place 200 mg into feeding tube daily.   Yes [provider]  aspirin 81 MG chewable tablet Place 81 mg into feeding tube daily.   Yes [provider]  atorvastatin (LIPITOR) 40 MG tablet Place 40 mg into feeding tube daily.   Yes [provider]  calcium-vitamin D (OSCAL WITH D) 500-200 MG-UNIT tablet Place 1 tablet into feeding tube daily with breakfast.   Yes [provider]  famotidine (PEPCID) 20 MG tablet Place 20 mg into feeding tube daily.   Yes [provider]  ferrous sulfate 300 (60 Fe) MG/5ML syrup Place 300 mg into feeding tube 2 (two) times daily with a meal.   Yes [provider]  furosemide (LASIX) 20 MG tablet Place 20 mg into feeding tube daily.   Yes [provider]  gabapentin (NEURONTIN) 300 MG capsule Place 300 mg into feeding tube at bedtime.   Yes [provider]  losartan (COZAAR) 25 MG tablet Place 25 mg into feeding tube daily. Hold if SBP <120, or DBP <70   Yes [provider]  magnesium oxide (MAG-OX) 400 MG tablet Place 400 mg into feeding tube 2 (two) times daily.   Yes [provider]  Menthol, Topical Analgesic, (BIOFREEZE) 4 % GEL Apply topically 2 (two) times daily. To left knee   Yes [provider]  metoprolol tartrate (LOPRESSOR) 25 MG tablet Place 12.5 mg into feeding tube 2 (two) times daily.   Yes  [provider]  Multiple Vitamin (MULTIVITAMIN WITH MINERALS) TABS tablet Place 1 tablet into feeding tube daily.   Yes [provider]  vitamin C (ASCORBIC ACID) 500 MG tablet Take 500 mg by mouth daily.   Yes [provider]  Vitamin D, Ergocalciferol, (DRISDOL) 50000 units CAPS capsule Take 50,000 Units by mouth every 30 (thirty) days.   Yes [provider]   No Known Allergies Review of Systems  Respiratory: Positive for shortness of breath.   Neurological: Positive for weakness.    Physical Exam  Constitutional: He is oriented to person, place, and time. He is cooperative.  HENT:  Head: Normocephalic and atraumatic.  Pulmonary/Chest: No accessory muscle usage. No tachypnea. No respiratory distress.  Abdominal: Normal appearance.  Neurological: He is alert and oriented to person, place, and time.  Skin: Skin is warm and dry.  Psychiatric: He has a normal mood and affect. His speech is normal and behavior is normal.  Nursing note and vitals reviewed.  Vital Signs: BP 129/75   Pulse 81   Temp 97.9 F (36.6 C) (Oral)   Resp 19   Ht '5\' 11"'$  (1.803 m)   Wt 81.7 kg (180 lb 1.9 oz)   SpO2 97%   BMI 25.12 kg/m  Pain Assessment: No/denies pain POSS *See Group Information*: 1-Acceptable,Awake and alert Pain Score: 0-No pain  SpO2: SpO2: 97 % O2 Device:SpO2: 97 % O2 Flow Rate: .O2 Flow Rate (L/min): 3 L/min  IO: Intake/output summary:   Intake/Output Summary (Last 24 hours) at 03/26/2017 1117 Last data filed at 03/26/2017 0445 Gross per 24 hour  Intake 480 ml  Output 2875 ml  Net -2395 ml    LBM: Last BM Date: 03/22/17 Baseline Weight: Weight: 84.8 kg (187 lb) Most recent weight: Weight: 81.7 kg (180 lb 1.9 oz)     Palliative Assessment/Data: PPS 40%     Time In/Out: 1230-1330 Time Total: 44mn Greater than 50%  of this time was spent counseling and coordinating care related to the above assessment and plan.  Signed  by:  MIhor Dow FNP-C Palliative Medicine Team  Phone: [phone]: [phone]  Please contact Palliative Medicine Team phone at [phone] questions and concerns.  For individual provider: See Shea Evans

## 2017-03-26 NOTE — Progress Notes (Signed)
PROGRESS NOTE    [name] [name]  VFI:[id number] DOB: [birth year] DOA: 03/22/2017 PCP: Garwin Brothers, MD   Brief Narrative by Dr Myna Hidalgo: "[name] [name] is a 72 y.o. male with medical history significant for chronic systolic CHF, hypertension, paroxysmal atrial fibrillation, and history of CVA with hemiplegia, now presenting from his SNF for evaluation of respiratory distress.  Patient reports that he developed shortness of breath beginning 2 weeks ago and has progressively worsened since that time.  He reports an occasional cough, sometimes productive of scant thick sputum.  He reportedly had a fever at the nursing facility and was treated with Tylenol prior to transfer to the ED.  He denies chest pain or palpitations.  Upon EMS arrival, patient was reportedly saturating 86% on 4 L/min of supplemental oxygen.  He does not require supplemental oxygen at baseline".  "Upon arrival to the ED, patient is found to be afebrile, saturating mid 90s with 6 L/min of supplemental oxygen, and with vitals otherwise normal.  EKG features a sinus rhythm with PAC and nonspecific IVCD.  Chest x-ray is notable for moderate cardiomegaly with diffuse hazy opacities throughout bilateral lungs and medium pleural effusions consistent with CHF, but with pneumonia difficult to exclude.  Chemistry panel reveals a creatinine of 1.23, slightly up from priors.  CBC is notable for a stable chronic thrombocytosis with platelets 432,000.  Lactic acid is elevated to 2.34, troponin is within normal limits, and BNP is 1180".   Assessment & Plan:   Principal Problem:   Acute on chronic systolic CHF (congestive heart failure) (HCC) Active Problems:   Acute respiratory failure with hypoxia (HCC)   CVA, old, aphasia   Mild renal insufficiency   AF (paroxysmal atrial fibrillation) (HCC)   1-Acute hypoxic Respiratory Failure; Secondary to acute on chronic systolic HF exacerbation;  Pneumonia.  Chest x ray; moderated cardiomegaly with  diffuse opacity, bilateral pleural effusion. Could be HF, pneumonia difficult to exclude.  Influenza negative, blood cultures; growing staph, methicillin resistant anaerobic and aerobic bottle.  Weight; 187---190. Prior weight 240 pd on 2017.   Strep pneumonia antigen negative and legionella antigen negative.  ECHO showed Ef 20 % diastolic dysfunction.  Treated with IV lasix 40 mg IV BID. Plan to change to oral.   Continue with IV Unasyn. Day 5.    2-Lactic acidosis. PNA,  Continue with Unasyn to cover for aspiration PNA. Added vancomycin due to MRSA PCR positive, nursing home resident and Staph Bacteremia.  Speech swallow evaluation. At risk for aspiration Lactic acid trending down.  Staph methicillin resistance 1 set of 2 blood culture . Discussed with Dr Linus Salmons. Likely contaminate. Will stop vancomycin. Repeat blood cultures 3-16 with no growth.  He is at risk for aspiration, he wants to continue with diet. Refuse use Peg tube for nutrition. I will consult Palliative for goals of care and further discussion with patient and family.   Acute systolic and diastolic  HF exacerbation;  No prior ECHO to compare.  Negative 5 l  Cardiology consulted.  I reviewed Records last ECHO 2017 Ef 40%. Son, who is a physician, would like to know next step in management, for newly decrease EF from cardiology ? Marland Kitchen  Treated with IV lasix 40 mg IV BID. Transition to oral today. Repeated chest x ray with right greater than left pleural effusion.  Started on entresto.   Possible 8 mm nodule; needs short term follow up.   Paroxysmal A fib.  EKG sinus rhythm on admission.  Continue with amiodarone.  History of CVA with hemiparesis;  continue with aspirin, lipitor.   Mild AKI; received fluids. Monitor on lasix.      DVT prophylaxis: Lovenox.  Code Status: partial code.  Family Communication: Care discussed with patient.  Disposition Plan: back to SNF when respiratory status improved.    Consultants:      Procedures:  ECHO---    Antimicrobials:   Unasyn 3-13.   Subjective: He is feeling better, dyspnea improved. Cough better   Objective: Vitals:   03/26/17 0442 03/26/17 0748 03/26/17 1011 03/26/17 1237  BP: 128/68 (!) 150/91 129/75 137/84  Pulse: 83 74 81 70  Resp: 20 19  15   Temp: 98 F (36.7 C) 97.9 F (36.6 C)  97.9 F (36.6 C)  TempSrc: Oral Oral  Oral  SpO2: 92% 97%  100%  Weight: 81.7 kg (180 lb 1.9 oz)     Height:        Intake/Output Summary (Last 24 hours) at 03/26/2017 1641 Last data filed at 03/26/2017 1238 Gross per 24 hour  Intake 360 ml  Output 2075 ml  Net -1715 ml   Filed Weights   03/24/17 0429 03/25/17 0219 03/26/17 0442  Weight: 86.8 kg (191 lb 5.8 oz) 82.6 kg (182 lb 1.6 oz) 81.7 kg (180 lb 1.9 oz)    Examination:  General exam: NAD Respiratory system: Normal respiratory effort, crackles bases.  Cardiovascular system: S 1, S 2 RRR Gastrointestinal system; BS present, soft, Peg tube in place.  Central nervous system: Left side hemiparesis.  Extremities: trace edema.  Skin: no rash      Data Reviewed: I have personally reviewed following labs and imaging studies  CBC: Recent Labs  Lab 03/22/17 0252 03/23/17 0852 03/24/17 0704 03/25/17 0437  WBC 7.9 5.9 8.1 6.2  NEUTROABS 5.1  --   --   --   HGB 13.7 11.8* 13.2 12.6*  HCT 42.3 37.1* 41.2 39.8  MCV 76.6* 75.6* 76.6* 76.7*  PLT 432* 337 363 381   Basic Metabolic Panel: Recent Labs  Lab 03/22/17 0252 03/23/17 0559 03/24/17 0704 03/25/17 0437 03/26/17 0408  NA 141 143 144 143 145  K 3.9 4.0 3.3* 3.4* 3.3*  CL 107 108 106 102 104  CO2 20* 25 27 29  32  GLUCOSE 124* 111* 93 89 143*  BUN 9 14 16 13 13   CREATININE 1.23 1.19 1.35* 1.30* 1.47*  CALCIUM 10.1 10.0 10.3 9.7 9.9   GFR: Estimated Creatinine Clearance: 49.1 mL/min (A) (by C-G formula based on SCr of 1.47 mg/dL (H)). Liver Function Tests: Recent Labs  Lab 03/22/17 0252  AST 21  ALT 15*   ALKPHOS 113  BILITOT 0.9  PROT 6.6  ALBUMIN 3.0*   No results for input(s): LIPASE, AMYLASE in the last 168 hours. No results for input(s): AMMONIA in the last 168 hours. Coagulation Profile: No results for input(s): INR, PROTIME in the last 168 hours. Cardiac Enzymes: No results for input(s): CKTOTAL, CKMB, CKMBINDEX, TROPONINI in the last 168 hours. BNP (last 3 results) No results for input(s): PROBNP in the last 8760 hours. HbA1C: No results for input(s): HGBA1C in the last 72 hours. CBG: Recent Labs  Lab 03/23/17 1137  GLUCAP 111*   Lipid Profile: No results for input(s): CHOL, HDL, LDLCALC, TRIG, CHOLHDL, LDLDIRECT in the last 72 hours. Thyroid Function Tests: No results for input(s): TSH, T4TOTAL, FREET4, T3FREE, THYROIDAB in the last 72 hours. Anemia Panel: No results for input(s): VITAMINB12, FOLATE, FERRITIN, TIBC, IRON, RETICCTPCT in the last  72 hours. Sepsis Labs: Recent Labs  Lab 03/22/17 1044 03/22/17 1336 03/22/17 1842 03/22/17 2137 03/23/17 0559  PROCALCITON  --   --   --   --  0.28  LATICACIDVEN 3.4* 3.7* 2.6* 1.9  --     Recent Results (from the past 240 hour(s))  Culture, blood (routine x 2)     Status: None (Preliminary result)   Collection Time: 03/22/17  2:44 AM  Result Value Ref Range Status   Specimen Description BLOOD LEFT HAND  Final   Special Requests IN PEDIATRIC BOTTLE Blood Culture adequate volume  Final   Culture   Final    NO GROWTH 4 DAYS Performed at [facility], [place] [address], [place], [place] [address]    Report Status PENDING  Incomplete  Culture, blood (routine x 2)     Status: Abnormal   Collection Time: 03/22/17  4:08 AM  Result Value Ref Range Status   Specimen Description BLOOD RIGHT ARM  Final   Special Requests   Final    BOTTLES DRAWN AEROBIC AND ANAEROBIC Blood Culture adequate volume   Culture  Setup Time   Final    GRAM POSITIVE COCCI IN BOTH AEROBIC AND ANAEROBIC BOTTLES CRITICAL RESULT CALLED TO,  READ BACK BY AND VERIFIED WITH: V BRYK PHARMD 03/23/17 0225 JDW    Culture (A)  Final    STAPHYLOCOCCUS SPECIES (COAGULASE NEGATIVE) THE SIGNIFICANCE OF ISOLATING THIS ORGANISM FROM A SINGLE SET OF BLOOD CULTURES WHEN MULTIPLE SETS ARE DRAWN IS UNCERTAIN. PLEASE NOTIFY THE MICROBIOLOGY DEPARTMENT WITHIN ONE WEEK IF SPECIATION AND SENSITIVITIES ARE REQUIRED. Performed at [facility], [place] [address], [place], [place] [address]    Report Status 03/24/2017 FINAL  Final  Blood Culture ID Panel (Reflexed)     Status: Abnormal   Collection Time: 03/22/17  4:08 AM  Result Value Ref Range Status   Enterococcus species NOT DETECTED NOT DETECTED Final   Listeria monocytogenes NOT DETECTED NOT DETECTED Final   Staphylococcus species DETECTED (A) NOT DETECTED Final    Comment: Methicillin (oxacillin) resistant coagulase negative staphylococcus. Possible blood culture contaminant (unless isolated from more than one blood culture draw or clinical case suggests pathogenicity). No antibiotic treatment is indicated for blood  culture contaminants. CRITICAL RESULT CALLED TO, READ BACK BY AND VERIFIED WITH: V BRYK PHARMD 03/23/17 0225 JDW    Staphylococcus aureus NOT DETECTED NOT DETECTED Final   Methicillin resistance DETECTED (A) NOT DETECTED Final    Comment: CRITICAL RESULT CALLED TO, READ BACK BY AND VERIFIED WITH: V BRYK PHARMD 03/23/17 0225 JDW    Streptococcus species NOT DETECTED NOT DETECTED Final   Streptococcus agalactiae NOT DETECTED NOT DETECTED Final   Streptococcus pneumoniae NOT DETECTED NOT DETECTED Final   Streptococcus pyogenes NOT DETECTED NOT DETECTED Final   Acinetobacter baumannii NOT DETECTED NOT DETECTED Final   Enterobacteriaceae species NOT DETECTED NOT DETECTED Final   Enterobacter cloacae complex NOT DETECTED NOT DETECTED Final   Escherichia coli NOT DETECTED NOT DETECTED Final   Klebsiella oxytoca NOT DETECTED NOT DETECTED Final   Klebsiella pneumoniae NOT DETECTED NOT  DETECTED Final   Proteus species NOT DETECTED NOT DETECTED Final   Serratia marcescens NOT DETECTED NOT DETECTED Final   Haemophilus influenzae NOT DETECTED NOT DETECTED Final   Neisseria meningitidis NOT DETECTED NOT DETECTED Final   Pseudomonas aeruginosa NOT DETECTED NOT DETECTED Final   Candida albicans NOT DETECTED NOT DETECTED Final   Candida glabrata NOT DETECTED NOT DETECTED Final  Candida krusei NOT DETECTED NOT DETECTED Final   Candida parapsilosis NOT DETECTED NOT DETECTED Final   Candida tropicalis NOT DETECTED NOT DETECTED Final    Comment: Performed at [facility], [place] [address], [place], [place] [address]  Urine culture     Status: None   Collection Time: 03/22/17  4:10 AM  Result Value Ref Range Status   Specimen Description URINE, CATHETERIZED  Final   Special Requests NONE  Final   Culture   Final    NO GROWTH Performed at [facility], [address], [place], [place] [address]    Report Status 03/23/2017 FINAL  Final  MRSA PCR Screening     Status: Abnormal   Collection Time: 03/22/17  1:54 PM  Result Value Ref Range Status   MRSA by PCR POSITIVE (A) NEGATIVE Final    Comment:        The GeneXpert MRSA Assay (FDA approved for NASAL specimens only), is one component of a comprehensive MRSA colonization surveillance program. It is not intended to diagnose MRSA infection nor to guide or monitor treatment for MRSA infections. RESULT CALLED TO, READ BACK BY AND VERIFIED WITH: K.WEST,RN AT 1635 BY L.PITT 03/22/17   Culture, blood (routine x 2)     Status: None (Preliminary result)   Collection Time: 03/25/17  1:31 PM  Result Value Ref Range Status   Specimen Description BLOOD LEFT HAND  Final   Special Requests   Final    BOTTLES DRAWN AEROBIC AND ANAEROBIC Blood Culture adequate volume   Culture   Final    NO GROWTH < 24 HOURS Performed at [facility], [place] [address], [place], [place] [address]    Report Status PENDING  Incomplete    Culture, blood (routine x 2)     Status: None (Preliminary result)   Collection Time: 03/25/17  1:37 PM  Result Value Ref Range Status   Specimen Description BLOOD LEFT HAND  Final   Special Requests   Final    BOTTLES DRAWN AEROBIC AND ANAEROBIC Blood Culture results may not be optimal due to an excessive volume of blood received in culture bottles   Culture   Final    NO GROWTH < 24 HOURS Performed at [facility], [place] [address], [place], [place] [address]    Report Status PENDING  Incomplete         Radiology Studies: Dg Chest Port 1 View  Result Date: 03/26/2017 CLINICAL DATA:  Heart failure.  Shortness of breath. EXAM: PORTABLE CHEST 1 VIEW COMPARISON:  March 23, 2017 FINDINGS: There is a possible 8 mm nodule in the left apex not seen in December 2017. No other nodules. Bilateral effusions, right greater than left, with underlying opacities. Suspected pulmonary venous congestion. Stable cardiomegaly. The hila and mediastinum are unchanged. IMPRESSION: 1. Possible 8 mm nodule in the left apex, not seen in 2017. Recommend attention to this region on a short-term follow-up after the patient's symptoms resolved. If the finding persists, a CT scan could further evaluate. 2. Right greater than left pleural effusions with underlying atelectasis. Probable pulmonary venous congestion. Electronically Signed   By: Dorise Bullion III M.D   On: 03/26/2017 15:15        Scheduled Meds: . amiodarone  200 mg Per Tube Daily  . aspirin  81 mg Per Tube Daily  . atorvastatin  40 mg Per Tube q1800  . calcium-vitamin D  1 tablet Per Tube Q breakfast  .  Chlorhexidine Gluconate Cloth  6 each Topical Q0600  . enoxaparin (LOVENOX) injection  40 mg Subcutaneous Q24H  . famotidine  20 mg Per Tube Daily  . furosemide  40 mg Oral Daily  . gabapentin  300 mg Per Tube QHS  . guaiFENesin  15 mL Per Tube Q6H  . magnesium oxide  400 mg Per Tube BID  . mouth rinse  15 mL Mouth Rinse BID  .  metoprolol tartrate  12.5 mg Per Tube BID  . multivitamin with minerals  1 tablet Per Tube Daily  . mupirocin ointment  1 application Nasal BID  . sacubitril-valsartan  1 tablet Oral BID  . sodium chloride flush  3 mL Intravenous Q12H  . vitamin C  500 mg Oral Daily   Continuous Infusions: . sodium chloride    . ampicillin-sulbactam (UNASYN) IV Stopped (03/26/17 1045)     LOS: 4 days    Time spent: 35 minutes.     Elmarie Shiley, MD [facility] Pager [phone]  If 7PM-7AM, please contact night-coverage [email or web address] Password [facility] 03/26/2017, 4:41 PM

## 2017-03-27 DIAGNOSIS — Z7189 Other specified counseling: Secondary | ICD-10-CM

## 2017-03-27 DIAGNOSIS — A419 Sepsis, unspecified organism: Secondary | ICD-10-CM

## 2017-03-27 DIAGNOSIS — Z515 Encounter for palliative care: Secondary | ICD-10-CM

## 2017-03-27 DIAGNOSIS — Z9189 Other specified personal risk factors, not elsewhere classified: Secondary | ICD-10-CM

## 2017-03-27 DIAGNOSIS — I69320 Aphasia following cerebral infarction: Secondary | ICD-10-CM

## 2017-03-27 LAB — BASIC METABOLIC PANEL
ANION GAP: 6 (ref 5–15)
BUN: 11 mg/dL (ref 6–20)
CALCIUM: 9.6 mg/dL (ref 8.9–10.3)
CO2: 32 mmol/L (ref 22–32)
CREATININE: 1.18 mg/dL (ref 0.61–1.24)
Chloride: 103 mmol/L (ref 101–111)
Glucose, Bld: 112 mg/dL — ABNORMAL HIGH (ref 65–99)
Potassium: 3.6 mmol/L (ref 3.5–5.1)
SODIUM: 141 mmol/L (ref 135–145)

## 2017-03-27 LAB — CBC
HCT: 40.3 % (ref 39.0–52.0)
HEMOGLOBIN: 12.6 g/dL — AB (ref 13.0–17.0)
MCH: 24.2 pg — AB (ref 26.0–34.0)
MCHC: 31.3 g/dL (ref 30.0–36.0)
MCV: 77.5 fL — ABNORMAL LOW (ref 78.0–100.0)
Platelets: 283 10*3/uL (ref 150–400)
RBC: 5.2 MIL/uL (ref 4.22–5.81)
RDW: 17.1 % — ABNORMAL HIGH (ref 11.5–15.5)
WBC: 5.6 10*3/uL (ref 4.0–10.5)

## 2017-03-27 LAB — CULTURE, BLOOD (ROUTINE X 2)
CULTURE: NO GROWTH
SPECIAL REQUESTS: ADEQUATE

## 2017-03-27 MED ORDER — CARVEDILOL 6.25 MG PO TABS
6.2500 mg | ORAL_TABLET | Freq: Two times a day (BID) | ORAL | Status: DC
  Administered 2017-03-27 – 2017-03-28 (×2): 6.25 mg via ORAL
  Filled 2017-03-27 (×2): qty 1

## 2017-03-27 MED ORDER — SPIRONOLACTONE 12.5 MG HALF TABLET
12.5000 mg | ORAL_TABLET | Freq: Every day | ORAL | Status: DC
Start: 2017-03-28 — End: 2017-03-28
  Administered 2017-03-28: 12.5 mg via ORAL
  Filled 2017-03-27: qty 1

## 2017-03-27 NOTE — Progress Notes (Signed)
Progress Note  Patient Name: [name] [name] Date of Encounter: 03/27/2017  Primary Cardiologist: New   Subjective   No complaints today. Resting comfortably. Denies dyspnea. No chest pain.   Inpatient Medications    Scheduled Meds: . amiodarone  200 mg Per Tube Daily  . aspirin  81 mg Per Tube Daily  . atorvastatin  40 mg Per Tube q1800  . calcium-vitamin D  1 tablet Per Tube Q breakfast  . enoxaparin (LOVENOX) injection  40 mg Subcutaneous Q24H  . famotidine  20 mg Per Tube Daily  . furosemide  40 mg Oral Daily  . gabapentin  300 mg Per Tube QHS  . guaiFENesin  15 mL Per Tube Q6H  . magnesium oxide  400 mg Per Tube BID  . mouth rinse  15 mL Mouth Rinse BID  . metoprolol tartrate  12.5 mg Per Tube BID  . multivitamin with minerals  1 tablet Per Tube Daily  . mupirocin ointment  1 application Nasal BID  . sacubitril-valsartan  1 tablet Oral BID  . sodium chloride flush  3 mL Intravenous Q12H  . vitamin C  500 mg Oral Daily   Continuous Infusions: . sodium chloride    . ampicillin-sulbactam (UNASYN) IV Stopped (03/27/17 0215)   PRN Meds: sodium chloride, acetaminophen, ALPRAZolam, MUSCLE RUB, ondansetron (ZOFRAN) IV, sodium chloride flush, zolpidem   Vital Signs    Vitals:   03/26/17 1953 03/26/17 2248 03/26/17 2355 03/27/17 0504  BP: 105/72 113/75 (!) 144/94 120/73  Pulse: 71 68 79 69  Resp: (!) 24  20 17   Temp: 98 F (36.7 C)  97.7 F (36.5 C) 97.7 F (36.5 C)  TempSrc: Oral  Oral Oral  SpO2: 93%  95% 96%  Weight:    181 lb 14.1 oz (82.5 kg)  Height:        Intake/Output Summary (Last 24 hours) at 03/27/2017 0805 Last data filed at 03/27/2017 0504 Gross per 24 hour  Intake 360 ml  Output 1375 ml  Net -1015 ml   Filed Weights   03/25/17 0219 03/26/17 0442 03/27/17 0504  Weight: 182 lb 1.6 oz (82.6 kg) 180 lb 1.9 oz (81.7 kg) 181 lb 14.1 oz (82.5 kg)    Telemetry    NSR- Personally Reviewed  ECG    NSR w/ PACs - Personally Reviewed  Physical  Exam   GEN: No acute distress.   Neck: No JVD Cardiac: RRR, no murmurs, rubs, or gallops.  Respiratory: Clear to auscultation bilaterally. GI: Soft, nontender, non-distended  MS: .hemiplegia Neuro:  Nonfocal  Psych: Normal affect   Labs    Chemistry Recent Labs  Lab 03/22/17 0252  03/25/17 0437 03/26/17 0408 03/27/17 0611  NA 141   < > 143 145 141  K 3.9   < > 3.4* 3.3* 3.6  CL 107   < > 102 104 103  CO2 20*   < > 29 32 32  GLUCOSE 124*   < > 89 143* 112*  BUN 9   < > 13 13 11   CREATININE 1.23   < > 1.30* 1.47* 1.18  CALCIUM 10.1   < > 9.7 9.9 9.6  PROT 6.6  --   --   --   --   ALBUMIN 3.0*  --   --   --   --   AST 21  --   --   --   --   ALT 15*  --   --   --   --  ALKPHOS 113  --   --   --   --   BILITOT 0.9  --   --   --   --   GFRNONAA 58*   < > 54* 46* >60  GFRAA >60   < > >60 54* >60  ANIONGAP 14   < > 12 9 6    < > = values in this interval not displayed.     Hematology Recent Labs  Lab 03/23/17 0852 03/24/17 0704 03/25/17 0437  WBC 5.9 8.1 6.2  RBC 4.91 5.38 5.19  HGB 11.8* 13.2 12.6*  HCT 37.1* 41.2 39.8  MCV 75.6* 76.6* 76.7*  MCH 24.0* 24.5* 24.3*  MCHC 31.8 32.0 31.7  RDW 16.5* 17.0* 16.5*  PLT 337 363 320    Cardiac EnzymesNo results for input(s): TROPONINI in the last 168 hours.  Recent Labs  Lab 03/22/17 0250  TROPIPOC 0.03     BNP Recent Labs  Lab 03/22/17 0252  BNP 1,180.9*     DDimer No results for input(s): DDIMER in the last 168 hours.   Radiology    Dg Chest Port 1 View  Result Date: 03/26/2017 CLINICAL DATA:  Heart failure.  Shortness of breath. EXAM: PORTABLE CHEST 1 VIEW COMPARISON:  March 23, 2017 FINDINGS: There is a possible 8 mm nodule in the left apex not seen in December 2017. No other nodules. Bilateral effusions, right greater than left, with underlying opacities. Suspected pulmonary venous congestion. Stable cardiomegaly. The hila and mediastinum are unchanged. IMPRESSION: 1. Possible 8 mm nodule in the left  apex, not seen in 2017. Recommend attention to this region on a short-term follow-up after the patient's symptoms resolved. If the finding persists, a CT scan could further evaluate. 2. Right greater than left pleural effusions with underlying atelectasis. Probable pulmonary venous congestion. Electronically Signed   By: Dorise Bullion III M.D   On: 03/26/2017 15:15    Cardiac Studies   2D Echo 03/24/17 Study Conclusions  - Left ventricle: The cavity size was mildly dilated. There was   mild concentric hypertrophy. Systolic function was severely   reduced. The estimated ejection fraction was in the range of 20%   to 25%. Diffuse hypokinesis. Features are consistent with a   pseudonormal left ventricular filling pattern, with concomitant   abnormal relaxation and increased filling pressure (grade 2   diastolic dysfunction). Doppler parameters are consistent with   elevated ventricular end-diastolic filling pressure. - Ventricular septum: Septal motion showed paradox. - Mitral valve: There was mild regurgitation. - Left atrium: The atrium was mildly dilated. - Right ventricle: Systolic function was normal. - Tricuspid valve: There was trivial regurgitation. - Pulmonary arteries: Systolic pressure was within the normal   range. - Pericardium, extracardiac: There was a left pleural effusion.   Echo 10/2015 [facility] Findings Mitral Valve Structurally normal mitral valve with good mobility and no significant regurgitation. Aortic Valve Structurally normal aortic valve with good leaflet mobility, and no regurgitation. Tricuspid Valve Tricuspid valve is structurally normal. No significant tricuspid regurgitation . Pulmonic Valve Pulmonic valve is structurally normal. No Doppler evidence of pulmonic stenosis or insufficiency. Left Atrium Normal size left atrium. Left Ventricle Ejection fraction is visually estimated at 60-65% Normal left ventricular size and systolic  function with no appreciable segmental abnormality. Right Atrium Normal right atrium. Right Ventricle Normal right ventricular size and function. Pericardial Effusion No evidence of pericardial effusion. Miscellaneous The aorta is within normal limits. The IVC is dilated   Patient Profile  [name] [name] is a 72 y.o. male with a hx of S-CHF, DM, HTN, PAF, hemorrhagic CVA w/ hemiplegia (poor functional status), PAD w/ stents to the R subclavian and R-EIA, who is being seen today for the evaluation of CHF at the request of Dr Tyrell Antonio. EF 20-25% by echo this admit (chronically depressed but now worse).  Assessment & Plan    1. Acute on Chronic Combined Systolic and Diastolic CHF:  Pt denies dyspnea. Good diuresis with IV Lasix. Net I/Os negative 6 L since admit. Does not appear to be grossly volume overloaded. Pt transition from IV to PO Lasix yesterday. SCr improved and back WNL. K WNL. Continue Entresto and BB. ? Change from short acting metoprolol to long acting, Toprol XL, given systolic HF. He has cardiac risk factors but denies CP. Given his poor overall functional status, it has been recommended to consider conservative management only.    2. Atrial Fibrillation: on rhythm control with amiodarone. NSR on tele. He is not an a/c candidate given h/o hemorraghic stroke.  3. HTN: moderately elevated this morning but due to get Entresto and metoprolol this morning. Monitor response and continue to adjust med HF meds as BP allows. May be able to add spironolactone to regimen if room in BP.   4. DM: per primary.   5. PAD: h/o stents to the R subclavian and R-EIA. Continue ASA and statin.   6. H/o Hemorrhagic CVA: w/ subsequent hemiplegia  7. NSVT: 6 beat run of NSVT noted on tel. This is in the setting of low EF. Continue BB therapy and continue to monitor on tele. K is stable. Monitor electrolytes.   For questions or updates, please contact [place] Please consult  [email or web address] for contact info under Cardiology/STEMI.      Signed, Lyda Jester, PA-C  03/27/2017, 8:05 AM

## 2017-03-27 NOTE — Progress Notes (Signed)
CSW continuing to follow. CSW confirmed with patient's SNF they will be prepared to take him back. CSW to support with discharge when medically ready.  Estanislado Emms, [place]

## 2017-03-27 NOTE — Progress Notes (Signed)
Called and spoke with son [name] (retired physician) - he is in agreement that conservative therapy (no further ischemia evaluation) is in Mr. Axtman' best interest. Mr. Pursifull also does not want aggressive measures. They are both worried about recurrent TIA or stroke. He was not felt to be a good anticoagulation candidate by Dr. Rayann Heman, but the patient's son feels like we should anticoagulate him due to recurrent cardioembolic events he has had in the past. I will need to further address the risks of this with the patient.  Pixie Casino, MD, [facility], [place] Director of the Advanced Lipid Disorders &  Cardiovascular Risk Reduction Clinic Diplomate of the American Board of Clinical Lipidology Attending Cardiologist  Direct Dial: [phone]  Fax: [phone]  Website:  [email or web address]

## 2017-03-27 NOTE — Progress Notes (Addendum)
PROGRESS NOTE    [name] [name]  IRJ:[id number] DOB: 12-10-1945 DOA: 03/22/2017 PCP: Garwin Brothers, MD   Brief Narrative by Dr Myna Hidalgo: "[name] [name] is a 71 y.o. male with medical history significant for chronic systolic CHF, hypertension, paroxysmal atrial fibrillation, and history of CVA with hemiplegia, now presenting from his SNF for evaluation of respiratory distress.  Patient reports that he developed shortness of breath beginning 2 weeks ago and has progressively worsened since that time.  He reports an occasional cough, sometimes productive of scant thick sputum.  He reportedly had a fever at the nursing facility and was treated with Tylenol prior to transfer to the ED.  He denies chest pain or palpitations.  Upon EMS arrival, patient was reportedly saturating 86% on 4 L/min of supplemental oxygen.  He does not require supplemental oxygen at baseline".  "Upon arrival to the ED, patient is found to be afebrile, saturating mid 90s with 6 L/min of supplemental oxygen, and with vitals otherwise normal.  EKG features a sinus rhythm with PAC and nonspecific IVCD.  Chest x-ray is notable for moderate cardiomegaly with diffuse hazy opacities throughout bilateral lungs and medium pleural effusions consistent with CHF, but with pneumonia difficult to exclude.  Chemistry panel reveals a creatinine of 1.23, slightly up from priors.  CBC is notable for a stable chronic thrombocytosis with platelets 432,000.  Lactic acid is elevated to 2.34, troponin is within normal limits, and BNP is 1180".   Assessment & Plan:   Principal Problem:   Acute on chronic systolic CHF (congestive heart failure) (HCC) Active Problems:   Acute respiratory failure with hypoxia (HCC)   CVA, old, aphasia   Mild renal insufficiency   AF (paroxysmal atrial fibrillation) (HCC)   1-Acute hypoxic Respiratory Failure; Secondary to acute on chronic systolic HF exacerbation;  Pneumonia.  Chest x ray; moderated cardiomegaly with  diffuse opacity, bilateral pleural effusion. Could be HF, pneumonia difficult to exclude.  Influenza negative, blood cultures; growing staph, methicillin resistant anaerobic and aerobic bottle.  Weight; 187---190. Prior weight 240 pd on 2017.   Strep pneumonia antigen negative and legionella antigen negative.  ECHO showed Ef 20 % diastolic dysfunction.  Treated with IV lasix 40 mg IV BID. Now on oral lasix. 40 mg daily  Chest x ray 3-17, reviewed with radiologist, effusion appears to be improved.   2-Lactic acidosis. PNA,  Sepsis rule out  Continue with Unasyn to cover for aspiration PNA. Added vancomycin due to MRSA PCR positive, nursing home resident and Staph Bacteremia.  Speech swallow evaluation. At risk for aspiration Lactic acid trending down.  Staph methicillin resistance 1 set of 2 blood culture . Discussed with Dr Linus Salmons. Likely contaminate. Will stop vancomycin. Repeat blood cultures 3-16 with no growth.  He is at risk for aspiration, he wants to continue with diet. Refuse use Peg tube for nutrition. Palliative care consulted.  for goals of care and further discussion with patient and family.  Continue with IV Unasyn. Day 6/7.   Acute systolic and diastolic  HF exacerbation;  Negative 6 l  Cardiology consulted.  I reviewed Records last ECHO 2017 Ef 40%. Son, who is a physician, would like to know next step in management, for newly decrease EF from cardiology ? Marland Kitchen  Treated with IV lasix 40 mg IV BID. Transition to oral. Repeated chest x ray with right greater than left pleural effusion, improved since admission. Patient today on RA Started on entresto. Tolerating medication so far. Plan to start spironolactone.  Possible 8 mm nodule; needs short term follow up. Son aware.   Paroxysmal A fib.  EKG sinus rhythm on admission.  Continue with amiodarone.   History of CVA with hemiparesis;  continue with aspirin, lipitor.   Mild AKI; received fluids. Monitor on lasix.   GERD;  Pepcid.    DVT prophylaxis: Lovenox.  Code Status: partial code.  Family Communication: Care discussed with patient.  Disposition Plan: back to SNF depending if cardiology is planning further test. Dr Debara Pickett will contact son today   Consultants:      Procedures:  ECHO---    Antimicrobials:   Unasyn 3-13.   Subjective: Cough has resolved. He is breathing better.   Objective: Vitals:   03/26/17 2248 03/26/17 2355 03/27/17 0504 03/27/17 0805  BP: 113/75 (!) 144/94 120/73 (!) 158/94  Pulse: 68 79 69 81  Resp:  20 17 20   Temp:  97.7 F (36.5 C) 97.7 F (36.5 C) 98 F (36.7 C)  TempSrc:  Oral Oral Oral  SpO2:  95% 96%   Weight:   82.5 kg (181 lb 14.1 oz)   Height:        Intake/Output Summary (Last 24 hours) at 03/27/2017 1238 Last data filed at 03/27/2017 1226 Gross per 24 hour  Intake 600 ml  Output 1575 ml  Net -975 ml   Filed Weights   03/25/17 0219 03/26/17 0442 03/27/17 0504  Weight: 82.6 kg (182 lb 1.6 oz) 81.7 kg (180 lb 1.9 oz) 82.5 kg (181 lb 14.1 oz)    Examination:  General exam: NAD Respiratory system: Normal respiratory effort, few crackles bases.  Cardiovascular system: S 1, S 2 RRR Gastrointestinal system; BS present, peg tube in place.  Central nervous system: Left side hemiparesis.  Extremities: Trace edema  Skin: no rash      Data Reviewed: I have personally reviewed following labs and imaging studies  CBC: Recent Labs  Lab 03/22/17 0252 03/23/17 0852 03/24/17 0704 03/25/17 0437 03/27/17 0951  WBC 7.9 5.9 8.1 6.2 5.6  NEUTROABS 5.1  --   --   --   --   HGB 13.7 11.8* 13.2 12.6* 12.6*  HCT 42.3 37.1* 41.2 39.8 40.3  MCV 76.6* 75.6* 76.6* 76.7* 77.5*  PLT 432* 337 363 320 599   Basic Metabolic Panel: Recent Labs  Lab 03/23/17 0559 03/24/17 0704 03/25/17 0437 03/26/17 0408 03/27/17 0611  NA 143 144 143 145 141  K 4.0 3.3* 3.4* 3.3* 3.6  CL 108 106 102 104 103  CO2 25 27 29  32 32  GLUCOSE 111* 93 89 143* 112*  BUN  14 16 13 13 11   CREATININE 1.19 1.35* 1.30* 1.47* 1.18  CALCIUM 10.0 10.3 9.7 9.9 9.6   GFR: Estimated Creatinine Clearance: 61.2 mL/min (by C-G formula based on SCr of 1.18 mg/dL). Liver Function Tests: Recent Labs  Lab 03/22/17 0252  AST 21  ALT 15*  ALKPHOS 113  BILITOT 0.9  PROT 6.6  ALBUMIN 3.0*   No results for input(s): LIPASE, AMYLASE in the last 168 hours. No results for input(s): AMMONIA in the last 168 hours. Coagulation Profile: No results for input(s): INR, PROTIME in the last 168 hours. Cardiac Enzymes: No results for input(s): CKTOTAL, CKMB, CKMBINDEX, TROPONINI in the last 168 hours. BNP (last 3 results) No results for input(s): PROBNP in the last 8760 hours. HbA1C: No results for input(s): HGBA1C in the last 72 hours. CBG: Recent Labs  Lab 03/23/17 1137  GLUCAP 111*   Lipid Profile:  No results for input(s): CHOL, HDL, LDLCALC, TRIG, CHOLHDL, LDLDIRECT in the last 72 hours. Thyroid Function Tests: No results for input(s): TSH, T4TOTAL, FREET4, T3FREE, THYROIDAB in the last 72 hours. Anemia Panel: No results for input(s): VITAMINB12, FOLATE, FERRITIN, TIBC, IRON, RETICCTPCT in the last 72 hours. Sepsis Labs: Recent Labs  Lab 03/22/17 1044 03/22/17 1336 03/22/17 1842 03/22/17 2137 03/23/17 0559  PROCALCITON  --   --   --   --  0.28  LATICACIDVEN 3.4* 3.7* 2.6* 1.9  --     Recent Results (from the past 240 hour(s))  Culture, blood (routine x 2)     Status: None (Preliminary result)   Collection Time: 03/22/17  2:44 AM  Result Value Ref Range Status   Specimen Description BLOOD LEFT HAND  Final   Special Requests IN PEDIATRIC BOTTLE Blood Culture adequate volume  Final   Culture   Final    NO GROWTH 4 DAYS Performed at [facility], [place] [address], [place], [place] [address]    Report Status PENDING  Incomplete  Culture, blood (routine x 2)     Status: Abnormal   Collection Time: 03/22/17  4:08 AM  Result Value Ref Range Status    Specimen Description BLOOD RIGHT ARM  Final   Special Requests   Final    BOTTLES DRAWN AEROBIC AND ANAEROBIC Blood Culture adequate volume   Culture  Setup Time   Final    GRAM POSITIVE COCCI IN BOTH AEROBIC AND ANAEROBIC BOTTLES CRITICAL RESULT CALLED TO, READ BACK BY AND VERIFIED WITH: V BRYK PHARMD 03/23/17 0225 JDW    Culture (A)  Final    STAPHYLOCOCCUS SPECIES (COAGULASE NEGATIVE) THE SIGNIFICANCE OF ISOLATING THIS ORGANISM FROM A SINGLE SET OF BLOOD CULTURES WHEN MULTIPLE SETS ARE DRAWN IS UNCERTAIN. PLEASE NOTIFY THE MICROBIOLOGY DEPARTMENT WITHIN ONE WEEK IF SPECIATION AND SENSITIVITIES ARE REQUIRED. Performed at [facility], [place] [address] [place], [place] [address]    Report Status 03/24/2017 FINAL  Final  Blood Culture ID Panel (Reflexed)     Status: Abnormal   Collection Time: 03/22/17  4:08 AM  Result Value Ref Range Status   Enterococcus species NOT DETECTED NOT DETECTED Final   Listeria monocytogenes NOT DETECTED NOT DETECTED Final   Staphylococcus species DETECTED (A) NOT DETECTED Final    Comment: Methicillin (oxacillin) resistant coagulase negative staphylococcus. Possible blood culture contaminant (unless isolated from more than one blood culture draw or clinical case suggests pathogenicity). No antibiotic treatment is indicated for blood  culture contaminants. CRITICAL RESULT CALLED TO, READ BACK BY AND VERIFIED WITH: V BRYK PHARMD 03/23/17 0225 JDW    Staphylococcus aureus NOT DETECTED NOT DETECTED Final   Methicillin resistance DETECTED (A) NOT DETECTED Final    Comment: CRITICAL RESULT CALLED TO, READ BACK BY AND VERIFIED WITH: V BRYK PHARMD 03/23/17 0225 JDW    Streptococcus species NOT DETECTED NOT DETECTED Final   Streptococcus agalactiae NOT DETECTED NOT DETECTED Final   Streptococcus pneumoniae NOT DETECTED NOT DETECTED Final   Streptococcus pyogenes NOT DETECTED NOT DETECTED Final   Acinetobacter baumannii NOT DETECTED NOT DETECTED Final    Enterobacteriaceae species NOT DETECTED NOT DETECTED Final   Enterobacter cloacae complex NOT DETECTED NOT DETECTED Final   Escherichia coli NOT DETECTED NOT DETECTED Final   Klebsiella oxytoca NOT DETECTED NOT DETECTED Final   Klebsiella pneumoniae NOT DETECTED NOT DETECTED Final   Proteus species NOT DETECTED NOT DETECTED Final   Serratia marcescens NOT DETECTED NOT  DETECTED Final   Haemophilus influenzae NOT DETECTED NOT DETECTED Final   Neisseria meningitidis NOT DETECTED NOT DETECTED Final   Pseudomonas aeruginosa NOT DETECTED NOT DETECTED Final   Candida albicans NOT DETECTED NOT DETECTED Final   Candida glabrata NOT DETECTED NOT DETECTED Final   Candida krusei NOT DETECTED NOT DETECTED Final   Candida parapsilosis NOT DETECTED NOT DETECTED Final   Candida tropicalis NOT DETECTED NOT DETECTED Final    Comment: Performed at [facility], [place] [address], [place], [place] [address]  Urine culture     Status: None   Collection Time: 03/22/17  4:10 AM  Result Value Ref Range Status   Specimen Description URINE, CATHETERIZED  Final   Special Requests NONE  Final   Culture   Final    NO GROWTH Performed at [facility], [address], [place], [place] [address]    Report Status 03/23/2017 FINAL  Final  MRSA PCR Screening     Status: Abnormal   Collection Time: 03/22/17  1:54 PM  Result Value Ref Range Status   MRSA by PCR POSITIVE (A) NEGATIVE Final    Comment:        The GeneXpert MRSA Assay (FDA approved for NASAL specimens only), is one component of a comprehensive MRSA colonization surveillance program. It is not intended to diagnose MRSA infection nor to guide or monitor treatment for MRSA infections. RESULT CALLED TO, READ BACK BY AND VERIFIED WITH: K.WEST,RN AT 1635 BY L.PITT 03/22/17   Culture, blood (routine x 2)     Status: None (Preliminary result)   Collection Time: 03/25/17  1:31 PM  Result Value Ref Range Status   Specimen Description BLOOD LEFT  HAND  Final   Special Requests   Final    BOTTLES DRAWN AEROBIC AND ANAEROBIC Blood Culture adequate volume   Culture   Final    NO GROWTH 1 DAY Performed at [facility], [place] [address] [place], [place] [address]    Report Status PENDING  Incomplete  Culture, blood (routine x 2)     Status: None (Preliminary result)   Collection Time: 03/25/17  1:37 PM  Result Value Ref Range Status   Specimen Description BLOOD LEFT HAND  Final   Special Requests   Final    BOTTLES DRAWN AEROBIC AND ANAEROBIC Blood Culture results may not be optimal due to an excessive volume of blood received in culture bottles   Culture   Final    NO GROWTH 1 DAY Performed at [facility], [place] [address] [place], [place] [address]    Report Status PENDING  Incomplete         Radiology Studies: Dg Chest Port 1 View  Result Date: 03/26/2017 CLINICAL DATA:  Heart failure.  Shortness of breath. EXAM: PORTABLE CHEST 1 VIEW COMPARISON:  March 23, 2017 FINDINGS: There is a possible 8 mm nodule in the left apex not seen in December 2017. No other nodules. Bilateral effusions, right greater than left, with underlying opacities. Suspected pulmonary venous congestion. Stable cardiomegaly. The hila and mediastinum are unchanged. IMPRESSION: 1. Possible 8 mm nodule in the left apex, not seen in 2017. Recommend attention to this region on a short-term follow-up after the patient's symptoms resolved. If the finding persists, a CT scan could further evaluate. 2. Right greater than left pleural effusions with underlying atelectasis. Probable pulmonary venous congestion. Electronically Signed   By: Dorise Bullion III M.D   On: 03/26/2017 15:15  Scheduled Meds: . amiodarone  200 mg Per Tube Daily  . aspirin  81 mg Per Tube Daily  . atorvastatin  40 mg Per Tube q1800  . calcium-vitamin D  1 tablet Per Tube Q breakfast  . carvedilol  6.25 mg Oral BID WC  . enoxaparin (LOVENOX) injection  40 mg Subcutaneous  Q24H  . famotidine  20 mg Per Tube Daily  . furosemide  40 mg Oral Daily  . gabapentin  300 mg Per Tube QHS  . guaiFENesin  15 mL Per Tube Q6H  . magnesium oxide  400 mg Per Tube BID  . mouth rinse  15 mL Mouth Rinse BID  . multivitamin with minerals  1 tablet Per Tube Daily  . mupirocin ointment  1 application Nasal BID  . sacubitril-valsartan  1 tablet Oral BID  . sodium chloride flush  3 mL Intravenous Q12H  . [START ON 03/28/2017] spironolactone  12.5 mg Oral Daily  . vitamin C  500 mg Oral Daily   Continuous Infusions: . sodium chloride    . ampicillin-sulbactam (UNASYN) IV Stopped (03/27/17 1100)     LOS: 5 days    Time spent: 35 minutes.     Elmarie Shiley, MD [facility] Pager [phone]  If 7PM-7AM, please contact night-coverage [email or web address] Password [facility] 03/27/2017, 12:38 PM

## 2017-03-28 LAB — BASIC METABOLIC PANEL
ANION GAP: 9 (ref 5–15)
BUN: 12 mg/dL (ref 6–20)
CHLORIDE: 105 mmol/L (ref 101–111)
CO2: 29 mmol/L (ref 22–32)
Calcium: 9.4 mg/dL (ref 8.9–10.3)
Creatinine, Ser: 1.31 mg/dL — ABNORMAL HIGH (ref 0.61–1.24)
GFR calc non Af Amer: 53 mL/min — ABNORMAL LOW (ref >60)
Glucose, Bld: 130 mg/dL — ABNORMAL HIGH (ref 65–99)
POTASSIUM: 3.5 mmol/L (ref 3.5–5.1)
SODIUM: 143 mmol/L (ref 135–145)

## 2017-03-28 MED ORDER — CARVEDILOL 6.25 MG PO TABS: 6.2500 mg | ORAL_TABLET | Freq: Two times a day (BID) | ORAL | 0 refills | Status: DC

## 2017-03-28 MED ORDER — SACUBITRIL-VALSARTAN 24-26 MG PO TABS: 1.0000 | ORAL_TABLET | Freq: Two times a day (BID) | ORAL | 0 refills | Status: DC

## 2017-03-28 MED ORDER — SPIRONOLACTONE 25 MG PO TABS: 12.5000 mg | ORAL_TABLET | Freq: Every day | ORAL | 0 refills | Status: DC

## 2017-03-28 MED ORDER — POTASSIUM CHLORIDE 20 MEQ PO PACK
40.0000 meq | PACK | Freq: Once | ORAL | Status: DC
  Filled 2017-03-28: qty 2

## 2017-03-28 MED ORDER — FUROSEMIDE 20 MG PO TABS: 40.0000 mg | ORAL_TABLET | Freq: Every day | ORAL | 0 refills | Status: AC

## 2017-03-28 NOTE — Progress Notes (Signed)
  Speech Language Pathology Treatment: Dysphagia  Patient Details Name: [name] [name] MRN: [id number] DOB: 03-31-1945 Today's Date: 03/28/2017 Time: [id number] SLP Time Calculation (min) (ACUTE ONLY): 26 min  Assessment / Plan / Recommendation Clinical Impression  SLP facilitated pt safety and reiteration of swallow precautions with Dys 3 texture, thin liquids during lunch. Max verbal/visual/tactile cues were needed to prevent pt from overstuffing with 3-4 bites consecutively. He was unable to carry over cues and continued to need lots of cues even at the end of the session. Mild-moderate right sided pocketing and suboptimal chin tuck as he tucks chin while placing cup to lips. Delayed throat clears weak and ineffective. Pt is a known aspirator with chronic dysphagia and discussion had at time of initial evaluation between initial therapist, daughter and pt to allow pt to have thin liquids. Pt should have FULL supervision with meals to mitigate aspiration risks.    HPI HPI: 72 year old male admitted from SNF with respiratory distress. Chest x-ray is notable for moderate cardiomegaly with diffuse hazy opacities throughout bilateral lungs and medium pleural effusions consistent with CHF, but with pneumonia difficult to exclude. On abx to cover for aspiration PNA. SLP consult ordered. PMH of CVA with resultant dysphagia, CHF, HTN, GERD, PEG placement. Per patient and daughter, consuming both nectar thick and thin liquids at SNF prior to admission.       SLP Plan  Continue with current plan of care       Recommendations  Diet recommendations: Thin liquid;Dysphagia 3 (mechanical soft) Liquids provided via: Cup;No straw Medication Administration: Crushed with puree Supervision: Patient able to self feed;Full supervision/cueing for compensatory strategies Compensations: Minimize environmental distractions;Slow rate;Small sips/bites;Lingual sweep for clearance of pocketing;Clear throat  intermittently;Chin tuck(chin tuck with liquids) Postural Changes and/or Swallow Maneuvers: Seated upright 90 degrees                Oral Care Recommendations: Oral care BID Follow up Recommendations: Skilled Nursing facility SLP Visit Diagnosis: Dysphagia, oropharyngeal phase (R13.12) Plan: Continue with current plan of care                      Houston Siren 03/28/2017, 1:42 PM  Orbie Pyo Colvin Caroli.Ed [employer] [phone]

## 2017-03-28 NOTE — Progress Notes (Addendum)
Patient will discharge back to [facility] Anticipated discharge date: 03/28/17  Family notified: Connery Shiffler, spouse (also called son and daughter but voicemail boxes full, unable to leave messages). Mrs. Quinley requesting to speak to MD, stated she is flying down from [place] this evening to see patient. CSW paged MD.  Transportation by: [facility]  Nurse to call report to [phone].   CSW signing off.  Estanislado Emms, [place]  Clinical Social Worker

## 2017-03-28 NOTE — Clinical Social Work Placement (Signed)
   CLINICAL SOCIAL WORK PLACEMENT  NOTE  Date:  03/28/2017  Patient Details  Name: [name] [name] MRN: [id number] Date of Birth: [birth year]  Clinical Social Work is seeking post-discharge placement for this patient at the [place] level of care (*CSW will initial, date and re-position this form in  chart as items are completed):  Yes   Patient/family provided with [facility] Work Department's list of facilities offering this level of care within the geographic area requested by the patient (or if unable, by the patient's family).  Yes   Patient/family informed of their freedom to choose among providers that offer the needed level of care, that participate in Medicare, Medicaid or managed care program needed by the patient, have an available bed and are willing to accept the patient.  Yes   Patient/family informed of [facility]'s ownership interest in [facility] and [facility], as well as of the fact that they are under no obligation to receive care at these facilities.  PASRR submitted to EDS on       PASRR number received on       Existing PASRR number confirmed on 03/24/17     FL2 transmitted to all facilities in geographic area requested by pt/family on 03/24/17     FL2 transmitted to all facilities within larger geographic area on       Patient informed that his/her managed care company has contracts with or will negotiate with certain facilities, including the following:  [facility]     Yes   Patient/family informed of bed offers received.  Patient chooses bed at [facility]     Physician recommends and patient chooses bed at      Patient to be transferred to [facility] on 03/28/17.  Patient to be transferred to facility by [facility]     Patient family notified on 03/28/17 of transfer.  Name of family member notified:  [name], daughter     PHYSICIAN       Additional Comment:     _______________________________________________ Estanislado Emms, LCSW 03/28/2017, 12:53 PM

## 2017-03-28 NOTE — Progress Notes (Signed)
Progress Note  Patient Name: [name] [name] Date of Encounter: 03/28/2017  Primary Cardiologist: Dr. Debara Pickett Dr. Rayann Heman or Dr. Maryjean Morn in [place] well. No chest pain, sob or palpitations.   Inpatient Medications    Scheduled Meds: . amiodarone  200 mg Per Tube Daily  . aspirin  81 mg Per Tube Daily  . atorvastatin  40 mg Per Tube q1800  . calcium-vitamin D  1 tablet Per Tube Q breakfast  . carvedilol  6.25 mg Oral BID WC  . enoxaparin (LOVENOX) injection  40 mg Subcutaneous Q24H  . famotidine  20 mg Per Tube Daily  . furosemide  40 mg Oral Daily  . gabapentin  300 mg Per Tube QHS  . guaiFENesin  15 mL Per Tube Q6H  . magnesium oxide  400 mg Per Tube BID  . mouth rinse  15 mL Mouth Rinse BID  . multivitamin with minerals  1 tablet Per Tube Daily  . sacubitril-valsartan  1 tablet Oral BID  . sodium chloride flush  3 mL Intravenous Q12H  . spironolactone  12.5 mg Oral Daily  . vitamin C  500 mg Oral Daily   Continuous Infusions: . sodium chloride    . ampicillin-sulbactam (UNASYN) IV Stopped (03/28/17 0328)   PRN Meds: sodium chloride, acetaminophen, ALPRAZolam, MUSCLE RUB, ondansetron (ZOFRAN) IV, sodium chloride flush, zolpidem   Vital Signs    Vitals:   03/27/17 1641 03/27/17 1947 03/27/17 2338 03/28/17 0515  BP: (!) 150/70 128/73 134/73 130/69  Pulse: 73 72 73 73  Resp:  20 18 19   Temp: 98.6 F (37 C) 97.7 F (36.5 C) 98 F (36.7 C) (!) 97.5 F (36.4 C)  TempSrc: Oral Oral Oral Oral  SpO2:  91% 94% 95%  Weight:    182 lb 15.7 oz (83 kg)  Height:        Intake/Output Summary (Last 24 hours) at 03/28/2017 0900 Last data filed at 03/28/2017 0846 Gross per 24 hour  Intake 700 ml  Output 925 ml  Net -225 ml   Filed Weights   03/26/17 0442 03/27/17 0504 03/28/17 0515  Weight: 180 lb 1.9 oz (81.7 kg) 181 lb 14.1 oz (82.5 kg) 182 lb 15.7 oz (83 kg)    Telemetry    SR with NSVT x 5 beats - Personally Reviewed  ECG    N/A  Physical  Exam   GEN: No acute distress.   Neck: No JVD Cardiac: RRR, no murmurs, rubs, or gallops.  Respiratory: Clear to auscultation bilaterally. GI: Soft, nontender, non-distended  MS: No edema; No deformity. Neuro:  Nonfocal  Psych: Normal affect   Labs    Chemistry Recent Labs  Lab 03/22/17 0252  03/25/17 0437 03/26/17 0408 03/27/17 0611  NA 141   < > 143 145 141  K 3.9   < > 3.4* 3.3* 3.6  CL 107   < > 102 104 103  CO2 20*   < > 29 32 32  GLUCOSE 124*   < > 89 143* 112*  BUN 9   < > 13 13 11   CREATININE 1.23   < > 1.30* 1.47* 1.18  CALCIUM 10.1   < > 9.7 9.9 9.6  PROT 6.6  --   --   --   --   ALBUMIN 3.0*  --   --   --   --   AST 21  --   --   --   --   ALT  15*  --   --   --   --   ALKPHOS 113  --   --   --   --   BILITOT 0.9  --   --   --   --   GFRNONAA 58*   < > 54* 46* >60  GFRAA >60   < > >60 54* >60  ANIONGAP 14   < > 12 9 6    < > = values in this interval not displayed.     Hematology Recent Labs  Lab 03/24/17 0704 03/25/17 0437 03/27/17 0951  WBC 8.1 6.2 5.6  RBC 5.38 5.19 5.20  HGB 13.2 12.6* 12.6*  HCT 41.2 39.8 40.3  MCV 76.6* 76.7* 77.5*  MCH 24.5* 24.3* 24.2*  MCHC 32.0 31.7 31.3  RDW 17.0* 16.5* 17.1*  PLT 363 320 283    Cardiac EnzymesNo results for input(s): TROPONINI in the last 168 hours.  Recent Labs  Lab 03/22/17 0250  TROPIPOC 0.03     BNP Recent Labs  Lab 03/22/17 0252  BNP 1,180.9*     DDimer No results for input(s): DDIMER in the last 168 hours.   Radiology    Dg Chest Port 1 View  Result Date: 03/26/2017 CLINICAL DATA:  Heart failure.  Shortness of breath. EXAM: PORTABLE CHEST 1 VIEW COMPARISON:  March 23, 2017 FINDINGS: There is a possible 8 mm nodule in the left apex not seen in December 2017. No other nodules. Bilateral effusions, right greater than left, with underlying opacities. Suspected pulmonary venous congestion. Stable cardiomegaly. The hila and mediastinum are unchanged. IMPRESSION: 1. Possible 8 mm nodule  in the left apex, not seen in 2017. Recommend attention to this region on a short-term follow-up after the patient's symptoms resolved. If the finding persists, a CT scan could further evaluate. 2. Right greater than left pleural effusions with underlying atelectasis. Probable pulmonary venous congestion. Electronically Signed   By: Dorise Bullion III M.D   On: 03/26/2017 15:15    Cardiac Studies   2D Echo 03/24/17 Study Conclusions  - Left ventricle: The cavity size was mildly dilated. There was mild concentric hypertrophy. Systolic function was severely reduced. The estimated ejection fraction was in the range of 20% to 25%. Diffuse hypokinesis. Features are consistent with a pseudonormal left ventricular filling pattern, with concomitant abnormal relaxation and increased filling pressure (grade 2 diastolic dysfunction). Doppler parameters are consistent with elevated ventricular end-diastolic filling pressure. - Ventricular septum: Septal motion showed paradox. - Mitral valve: There was mild regurgitation. - Left atrium: The atrium was mildly dilated. - Right ventricle: Systolic function was normal. - Tricuspid valve: There was trivial regurgitation. - Pulmonary arteries: Systolic pressure was within the normal range. - Pericardium, extracardiac: There was a left pleural effusion.   Echo 10/2015 [facility] Findings Mitral Valve Structurally normal mitral valve with good mobility and no significant regurgitation. Aortic Valve Structurally normal aortic valve with good leaflet mobility, and no regurgitation. Tricuspid Valve Tricuspid valve is structurally normal. No significant tricuspid regurgitation . Pulmonic Valve Pulmonic valve is structurally normal. No Doppler evidence of pulmonic stenosis or insufficiency. Left Atrium Normal size left atrium. Left Ventricle Ejection fraction is visually estimated at 60-65% Normal left ventricular size and  systolic function with no appreciable segmental abnormality. Right Atrium Normal right atrium. Right Ventricle Normal right ventricular size and function. Pericardial Effusion No evidence of pericardial effusion. Miscellaneous The aorta is within normal limits. The IVC is dilated    Patient Profile  Landon Bassford a 72 y.o.malewith a hx of S-CHF, DM, HTN, PAF, hemorrhagic CVA w/ hemiplegia (poor functional status), PAD w/ stents to the R subclavian and R-EIA,who is being seen today for the evaluation of CHFat the request of Dr Tyrell Antonio. EF 20-25% by echo this admit (chronically depressed but now worse).  Assessment & Plan    1. Acute on Chronic Combined Systolic and Diastolic CHF:  Pt denies dyspnea.  - Net I & O negative 6.6L. Euvolemic. Weight down 5 lb (187-->182lb).  - No ischemic evaluation planned. Family with agreement.  - metoprolol changes on coreg this admission. Continue Entresto and Spironolactone.   2. Atrial Fibrillation:  - On rhythm control with amiodarone. NSR on tele. He is not an a/c candidate given h/o hemorraghic stroke. However son feels like we should anticoagulate him due to recurrent cardioembolic events he has had in the past  3. HTN:  - Normalized on current medications.   4. DM:  - per primary  5. PAD:  - h/o stents to the R subclavian and R-EIA. Continue ASA and statin.   6. H/o Hemorrhagic CVA: w/ subsequent hemiplegia  7. NSVT: 5 beat run of NSVT noted on tel yesterday. Continue BB. Pending bmet.    Dr. Debara Pickett to comment on anticoagulation and follow up plan.   For questions or updates, please contact [place] Please consult [email or web address] for contact info under Cardiology/STEMI.      Signed, Leanor Kail, PA  03/28/2017, 9:00 AM

## 2017-03-28 NOTE — Care Management Important Message (Signed)
Important Message  Patient Details  Name: [name] [name] MRN: [id number] Date of Birth: 08-05-1945   Medicare Important Message Given:  Yes    Bethena Roys, RN 03/28/2017, 12:03 PM

## 2017-03-28 NOTE — Discharge Summary (Addendum)
Physician Discharge Summary  [name] [name] ZJQ:[id number] DOB: 04/02/1945 DOA: 03/22/2017  PCP: Garwin Brothers, MD  Admit date: 03/22/2017 Discharge date: 03/28/2017  Admitted From: SNF Disposition:  SNF  Recommendations for Outpatient Follow-up:  1. Follow up with PCP in 1-2 weeks 2. Please obtain BMP/CBC in one week 3. Needs B-met to follow renal function and K level.  4. Needs to Follow up with Primary cardiology Dr Maryjean Morn.  5. Needs to be follow by palliative care.  6. Needs follow up chest x ray or CT for lung nodule.     Discharge Condition: Stable.  CODE STATUS: DNR, discussed with son, patient to be DNR at facility  Diet recommendation:  Dysphagia 3 diet  Brief/Interim Summary: Brief Narrative by Dr Myna Hidalgo: [name] [name] a 72 y.o.malewith medical history significant forchronic systolic CHF, hypertension, paroxysmal atrial fibrillation, and history of CVA with hemiplegia, now presenting from his SNF for evaluation of respiratory distress. Patient reports that he developed shortness of breath beginning 2 weeks ago and has progressively worsened since that time. He reports an occasional cough, sometimes productive of scant thick sputum. He reportedly had a fever at the nursing facility and was treated with Tylenol prior to transfer to the ED. He denies chest pain or palpitations. Upon EMS arrival, patient was reportedly saturating 86% on 4 L/min of supplemental oxygen. He does not require supplemental oxygen at baseline".  "Upon arrival to the ED, patient is found to be afebrile, saturating mid 90s with 6 L/min of supplemental oxygen, and with vitals otherwise normal. EKG features a sinus rhythm with PAC and nonspecific IVCD. Chest x-ray is notable for moderate cardiomegaly with diffuse hazy opacities throughout bilateral lungs and medium pleural effusions consistent with CHF, but with pneumonia difficult to exclude. Chemistry panel reveals a creatinine of 1.23, slightly up  from priors. CBC is notable for a stable chronic thrombocytosiswith platelets 432,000. Lactic acid is elevated to 2.34, troponin is within normal limits, and BNP is 1180".   Assessment & Plan:   Principal Problem:   Acute on chronic systolic CHF (congestive heart failure) (HCC) Active Problems:   Acute respiratory failure with hypoxia (HCC)   CVA, old, aphasia   Mild renal insufficiency   AF (paroxysmal atrial fibrillation) (HCC)   1-Acute hypoxic Respiratory Failure; Secondary to acute on chronic systolic HF exacerbation;  Pneumonia.  Chest x ray; moderated cardiomegaly with diffuse opacity, bilateral pleural effusion. Could be HF, pneumonia difficult to exclude.  Influenza negative, blood cultures; growing staph, methicillin resistant anaerobic and aerobic bottle.  Weight; 187---190. Prior weight 240 pd on 2017.   Strep pneumonia antigen negative and legionella antigen negative.  ECHO showed Ef 20 % diastolic dysfunction.  Treated with IV lasix 40 mg IV BID. Now on oral lasix. 40 mg daily  Chest x ray 3-17, reviewed with radiologist, effusion appears to be improved.  stable for discharge.   2-Lactic acidosis. PNA,  Sepsis rule out  Continue with Unasyn to cover for aspiration PNA. Added vancomycin due to MRSA PCR positive, nursing home resident and Staph Bacteremia.  Speech swallow evaluation. At risk for aspiration Lactic acid trending down.  Staph methicillin resistance 1 set of 2 blood culture . Discussed with Dr Linus Salmons. Likely contaminate. Will stop vancomycin. Repeat blood cultures 3-16 with no growth.  He is at risk for aspiration, he wants to continue with diet. Refuse use Peg tube for nutrition. Palliative care consulted.  for goals of care and further discussion with patient and family. Plan to have  palliative evaluation at facility.  Received Unasyn for 7 days.   Acute systolic and diastolic  HF exacerbation;  Negative 6 l  Cardiology consulted.  I reviewed  Records last ECHO 2017 Ef 40%. Son, who is a physician, would like to know next step in management, for newly decrease EF from cardiology ? Marland Kitchen  Treated with IV lasix 40 mg IV BID. Transition to oral. Repeated chest x ray with right greater than left pleural effusion, improved since admission. Patient today on RA Started on entresto. Tolerating medication so far. Plan to start spironolactone.   Possible 8 mm nodule; needs short term follow up. Son aware.   Paroxysmal A fib.  EKG sinus rhythm on admission.  Continue with amiodarone. On aspirin. He is consider too high risk for anticoagulation due to history of hemorrhagic stroke.   History of CVA with hemiparesis;  continue with aspirin, lipitor.   Mild AKI; received fluids. Monitor on lasix.  stable.   GERD; Pepcid.     Discharge Diagnoses:  Principal Problem:   Acute on chronic systolic CHF (congestive heart failure) (HCC) Active Problems:   Acute respiratory failure with hypoxia (HCC)   CVA, old, aphasia   Mild renal insufficiency   AF (paroxysmal atrial fibrillation) ([place])   Sepsis ([place])   At risk for aspiration   Palliative care by specialist   Goals of care, counseling/discussion    Discharge Instructions  Discharge Instructions    Diet - low sodium heart healthy   Complete by:  As directed    Increase activity slowly   Complete by:  As directed      Allergies as of 03/28/2017   No Known Allergies     Medication List    STOP taking these medications   losartan 25 MG tablet Commonly known as:  COZAAR   metoprolol tartrate 25 MG tablet Commonly known as:  LOPRESSOR     TAKE these medications   acetaminophen 325 MG tablet Commonly known as:  TYLENOL Place 650 mg into feeding tube every 6 (six) hours as needed for mild pain.   amiodarone 200 MG tablet Commonly known as:  PACERONE Place 200 mg into feeding tube daily.   aspirin 81 MG chewable tablet Place 81 mg into feeding tube daily.    atorvastatin 40 MG tablet Commonly known as:  LIPITOR Place 40 mg into feeding tube daily.   BIOFREEZE 4 % Gel Generic drug:  Menthol (Topical Analgesic) Apply topically 2 (two) times daily. To left knee   calcium-vitamin D 500-200 MG-UNIT tablet Commonly known as:  OSCAL WITH D Place 1 tablet into feeding tube daily with breakfast.   carvedilol 6.25 MG tablet Commonly known as:  COREG Take 1 tablet (6.25 mg total) by mouth 2 (two) times daily with a meal.   famotidine 20 MG tablet Commonly known as:  PEPCID Place 20 mg into feeding tube daily.   ferrous sulfate 300 (60 Fe) MG/5ML syrup Place 300 mg into feeding tube 2 (two) times daily with a meal.   furosemide 20 MG tablet Commonly known as:  LASIX Place 2 tablets (40 mg total) into feeding tube daily. What changed:  how much to take   gabapentin 300 MG capsule Commonly known as:  NEURONTIN Place 300 mg into feeding tube at bedtime.   magnesium oxide 400 MG tablet Commonly known as:  MAG-OX Place 400 mg into feeding tube 2 (two) times daily.   multivitamin with minerals Tabs tablet Place 1 tablet into feeding  tube daily.   sacubitril-valsartan 24-26 MG Commonly known as:  ENTRESTO Take 1 tablet by mouth 2 (two) times daily.   spironolactone 25 MG tablet Commonly known as:  ALDACTONE Take 0.5 tablets (12.5 mg total) by mouth daily.   vitamin C 500 MG tablet Commonly known as:  ASCORBIC ACID Take 500 mg by mouth daily.   Vitamin D (Ergocalciferol) 50000 units Caps capsule Commonly known as:  DRISDOL Take 50,000 Units by mouth every 30 (thirty) days.      Follow-up Information    Garwin Brothers, MD Follow up in 2 week(s).   Specialty:  Internal Medicine Contact information: [place] [address] [phone]          No Known Allergies  Consultations:  Cardiology    Procedures/Studies: Dg Chest 1 View  Result Date: 03/22/2017 CLINICAL DATA:  Shortness of breath EXAM: CHEST   1 VIEW COMPARISON:  Chest radiograph 12/20/2015 FINDINGS: Bilateral pleural effusions with lateral components. Moderate cardiomegaly. Diffuse hazy opacity throughout both lungs. IMPRESSION: Moderate cardiomegaly with diffuse hazy opacities throughout both lungs and medium-sized bilateral pleural effusions. This may indicate congestive heart failure. Pneumonia would be difficult to exclude. Electronically Signed   By: Ulyses Jarred M.D.   On: 03/22/2017 03:26   Dg Chest 2 View  Result Date: 03/23/2017 CLINICAL DATA:  Cough EXAM: CHEST - 2 VIEW COMPARISON:  03/22/2017 FINDINGS: Diffuse airspace disease and layering effusions. Cardiomegaly. Findings likely reflect moderate CHF. No real change since prior study. IMPRESSION: Bilateral airspace disease and layering effusions, most likely moderate CHF. No change. Electronically Signed   By: Rolm Baptise M.D.   On: 03/23/2017 10:56   Dg Chest Port 1 View  Result Date: 03/26/2017 CLINICAL DATA:  Heart failure.  Shortness of breath. EXAM: PORTABLE CHEST 1 VIEW COMPARISON:  March 23, 2017 FINDINGS: There is a possible 8 mm nodule in the left apex not seen in December 2017. No other nodules. Bilateral effusions, right greater than left, with underlying opacities. Suspected pulmonary venous congestion. Stable cardiomegaly. The hila and mediastinum are unchanged. IMPRESSION: 1. Possible 8 mm nodule in the left apex, not seen in 2017. Recommend attention to this region on a short-term follow-up after the patient's symptoms resolved. If the finding persists, a CT scan could further evaluate. 2. Right greater than left pleural effusions with underlying atelectasis. Probable pulmonary venous congestion. Electronically Signed   By: Dorise Bullion III M.D   On: 03/26/2017 15:15       Subjective: He is feeling better, denies dyspnea. Cough improved.   Discharge Exam: Vitals:   03/28/17 0852 03/28/17 0900  BP: (!) 158/87   Pulse:    Resp:    Temp:  97.8 F (36.6  C)  SpO2: 90%    Vitals:   03/27/17 2338 03/28/17 0515 03/28/17 0852 03/28/17 0900  BP: 134/73 130/69 (!) 158/87   Pulse: 73 73    Resp: 18 19    Temp: 98 F (36.7 C) (!) 97.5 F (36.4 C)  97.8 F (36.6 C)  TempSrc: Oral Oral  Oral  SpO2: 94% 95% 90%   Weight:  83 kg (182 lb 15.7 oz)    Height:        General: Pt is alert, awake, not in acute distress Cardiovascular: RRR, S1/S2 +, no rubs, no gallops Respiratory: CTA bilaterally, no wheezing, no rhonchi Abdominal: Soft, NT, ND, bowel sounds + Extremities: no edema, no cyanosis. Left side hemiparesis.  The results of significant diagnostics from this hospitalization (including imaging, microbiology, ancillary and laboratory) are listed below for reference.     Microbiology: Recent Results (from the past 240 hour(s))  Culture, blood (routine x 2)     Status: None   Collection Time: 03/22/17  2:44 AM  Result Value Ref Range Status   Specimen Description BLOOD LEFT HAND  Final   Special Requests IN PEDIATRIC BOTTLE Blood Culture adequate volume  Final   Culture   Final    NO GROWTH 5 DAYS Performed at [facility], [address] [place], [place] [address]    Report Status 03/27/2017 FINAL  Final  Culture, blood (routine x 2)     Status: Abnormal   Collection Time: 03/22/17  4:08 AM  Result Value Ref Range Status   Specimen Description BLOOD RIGHT ARM  Final   Special Requests   Final    BOTTLES DRAWN AEROBIC AND ANAEROBIC Blood Culture adequate volume   Culture  Setup Time   Final    GRAM POSITIVE COCCI IN BOTH AEROBIC AND ANAEROBIC BOTTLES CRITICAL RESULT CALLED TO, READ BACK BY AND VERIFIED WITH: V BRYK PHARMD 03/23/17 0225 JDW    Culture (A)  Final    STAPHYLOCOCCUS SPECIES (COAGULASE NEGATIVE) THE SIGNIFICANCE OF ISOLATING THIS ORGANISM FROM A SINGLE SET OF BLOOD CULTURES WHEN MULTIPLE SETS ARE DRAWN IS UNCERTAIN. PLEASE NOTIFY THE MICROBIOLOGY DEPARTMENT WITHIN ONE WEEK IF SPECIATION AND SENSITIVITIES  ARE REQUIRED. Performed at [facility], [place] [address], [place], [place] [address]    Report Status 03/24/2017 FINAL  Final  Blood Culture ID Panel (Reflexed)     Status: Abnormal   Collection Time: 03/22/17  4:08 AM  Result Value Ref Range Status   Enterococcus species NOT DETECTED NOT DETECTED Final   Listeria monocytogenes NOT DETECTED NOT DETECTED Final   Staphylococcus species DETECTED (A) NOT DETECTED Final    Comment: Methicillin (oxacillin) resistant coagulase negative staphylococcus. Possible blood culture contaminant (unless isolated from more than one blood culture draw or clinical case suggests pathogenicity). No antibiotic treatment is indicated for blood  culture contaminants. CRITICAL RESULT CALLED TO, READ BACK BY AND VERIFIED WITH: V BRYK PHARMD 03/23/17 0225 JDW    Staphylococcus aureus NOT DETECTED NOT DETECTED Final   Methicillin resistance DETECTED (A) NOT DETECTED Final    Comment: CRITICAL RESULT CALLED TO, READ BACK BY AND VERIFIED WITH: V BRYK PHARMD 03/23/17 0225 JDW    Streptococcus species NOT DETECTED NOT DETECTED Final   Streptococcus agalactiae NOT DETECTED NOT DETECTED Final   Streptococcus pneumoniae NOT DETECTED NOT DETECTED Final   Streptococcus pyogenes NOT DETECTED NOT DETECTED Final   Acinetobacter baumannii NOT DETECTED NOT DETECTED Final   Enterobacteriaceae species NOT DETECTED NOT DETECTED Final   Enterobacter cloacae complex NOT DETECTED NOT DETECTED Final   Escherichia coli NOT DETECTED NOT DETECTED Final   Klebsiella oxytoca NOT DETECTED NOT DETECTED Final   Klebsiella pneumoniae NOT DETECTED NOT DETECTED Final   Proteus species NOT DETECTED NOT DETECTED Final   Serratia marcescens NOT DETECTED NOT DETECTED Final   Haemophilus influenzae NOT DETECTED NOT DETECTED Final   Neisseria meningitidis NOT DETECTED NOT DETECTED Final   Pseudomonas aeruginosa NOT DETECTED NOT DETECTED Final   Candida albicans NOT DETECTED NOT DETECTED Final    Candida glabrata NOT DETECTED NOT DETECTED Final   Candida krusei NOT DETECTED NOT DETECTED Final   Candida parapsilosis NOT DETECTED NOT DETECTED Final   Candida tropicalis  NOT DETECTED NOT DETECTED Final    Comment: Performed at [facility], [place] [address], [place], [place] [address]  Urine culture     Status: None   Collection Time: 03/22/17  4:10 AM  Result Value Ref Range Status   Specimen Description URINE, CATHETERIZED  Final   Special Requests NONE  Final   Culture   Final    NO GROWTH Performed at [facility], [address], [place], [place] [address]    Report Status 03/23/2017 FINAL  Final  MRSA PCR Screening     Status: Abnormal   Collection Time: 03/22/17  1:54 PM  Result Value Ref Range Status   MRSA by PCR POSITIVE (A) NEGATIVE Final    Comment:        The GeneXpert MRSA Assay (FDA approved for NASAL specimens only), is one component of a comprehensive MRSA colonization surveillance program. It is not intended to diagnose MRSA infection nor to guide or monitor treatment for MRSA infections. RESULT CALLED TO, READ BACK BY AND VERIFIED WITH: K.WEST,RN AT 1635 BY L.PITT 03/22/17   Culture, blood (routine x 2)     Status: None (Preliminary result)   Collection Time: 03/25/17  1:31 PM  Result Value Ref Range Status   Specimen Description BLOOD LEFT HAND  Final   Special Requests   Final    BOTTLES DRAWN AEROBIC AND ANAEROBIC Blood Culture adequate volume   Culture   Final    NO GROWTH 3 DAYS Performed at [facility], [address], [place], [place] [address]    Report Status PENDING  Incomplete  Culture, blood (routine x 2)     Status: None (Preliminary result)   Collection Time: 03/25/17  1:37 PM  Result Value Ref Range Status   Specimen Description BLOOD LEFT HAND  Final   Special Requests   Final    BOTTLES DRAWN AEROBIC AND ANAEROBIC Blood Culture results may not be optimal due to an excessive volume of blood received in culture  bottles   Culture   Final    NO GROWTH 3 DAYS Performed at [facility], [place] [address], [place], [place] [address]    Report Status PENDING  Incomplete     Labs: BNP (last 3 results) Recent Labs    03/22/17 0252  BNP 6,770.3*   Basic Metabolic Panel: Recent Labs  Lab 03/24/17 0704 03/25/17 0437 03/26/17 0408 03/27/17 0611 03/28/17 0844  NA 144 143 145 141 143  K 3.3* 3.4* 3.3* 3.6 3.5  CL 106 102 104 103 105  CO2 27 29 32 32 29  GLUCOSE 93 89 143* 112* 130*  BUN '16 13 13 11 12  ' CREATININE 1.35* 1.30* 1.47* 1.18 1.31*  CALCIUM 10.3 9.7 9.9 9.6 9.4   Liver Function Tests: Recent Labs  Lab 03/22/17 0252  AST 21  ALT 15*  ALKPHOS 113  BILITOT 0.9  PROT 6.6  ALBUMIN 3.0*   No results for input(s): LIPASE, AMYLASE in the last 168 hours. No results for input(s): AMMONIA in the last 168 hours. CBC: Recent Labs  Lab 03/22/17 0252 03/23/17 0852 03/24/17 0704 03/25/17 0437 03/27/17 0951  WBC 7.9 5.9 8.1 6.2 5.6  NEUTROABS 5.1  --   --   --   --   HGB 13.7 11.8* 13.2 12.6* 12.6*  HCT 42.3 37.1* 41.2 39.8 40.3  MCV 76.6* 75.6* 76.6* 76.7* 77.5*  PLT 432* 337 363 320 283   Cardiac Enzymes:  No results for input(s): CKTOTAL, CKMB, CKMBINDEX, TROPONINI in the last 168 hours. BNP: Invalid input(s): POCBNP CBG: Recent Labs  Lab 03/23/17 1137  GLUCAP 111*   D-Dimer No results for input(s): DDIMER in the last 72 hours. Hgb A1c No results for input(s): HGBA1C in the last 72 hours. Lipid Profile No results for input(s): CHOL, HDL, LDLCALC, TRIG, CHOLHDL, LDLDIRECT in the last 72 hours. Thyroid function studies No results for input(s): TSH, T4TOTAL, T3FREE, THYROIDAB in the last 72 hours.  Invalid input(s): FREET3 Anemia work up No results for input(s): VITAMINB12, FOLATE, FERRITIN, TIBC, IRON, RETICCTPCT in the last 72 hours. Urinalysis    Component Value Date/Time   COLORURINE YELLOW 03/22/2017 0410   APPEARANCEUR CLEAR 03/22/2017 0410   LABSPEC  1.014 03/22/2017 0410   PHURINE 6.0 03/22/2017 0410   GLUCOSEU NEGATIVE 03/22/2017 0410   HGBUR NEGATIVE 03/22/2017 0410   BILIRUBINUR NEGATIVE 03/22/2017 0410   KETONESUR 5 (A) 03/22/2017 0410   PROTEINUR 100 (A) 03/22/2017 0410   NITRITE NEGATIVE 03/22/2017 0410   LEUKOCYTESUR NEGATIVE 03/22/2017 0410   Sepsis Labs Invalid input(s): PROCALCITONIN,  WBC,  LACTICIDVEN Microbiology Recent Results (from the past 240 hour(s))  Culture, blood (routine x 2)     Status: None   Collection Time: 03/22/17  2:44 AM  Result Value Ref Range Status   Specimen Description BLOOD LEFT HAND  Final   Special Requests IN PEDIATRIC BOTTLE Blood Culture adequate volume  Final   Culture   Final    NO GROWTH 5 DAYS Performed at [facility], [place] [address], [place], [place] [address]    Report Status 03/27/2017 FINAL  Final  Culture, blood (routine x 2)     Status: Abnormal   Collection Time: 03/22/17  4:08 AM  Result Value Ref Range Status   Specimen Description BLOOD RIGHT ARM  Final   Special Requests   Final    BOTTLES DRAWN AEROBIC AND ANAEROBIC Blood Culture adequate volume   Culture  Setup Time   Final    GRAM POSITIVE COCCI IN BOTH AEROBIC AND ANAEROBIC BOTTLES CRITICAL RESULT CALLED TO, READ BACK BY AND VERIFIED WITH: V BRYK PHARMD 03/23/17 0225 JDW    Culture (A)  Final    STAPHYLOCOCCUS SPECIES (COAGULASE NEGATIVE) THE SIGNIFICANCE OF ISOLATING THIS ORGANISM FROM A SINGLE SET OF BLOOD CULTURES WHEN MULTIPLE SETS ARE DRAWN IS UNCERTAIN. PLEASE NOTIFY THE MICROBIOLOGY DEPARTMENT WITHIN ONE WEEK IF SPECIATION AND SENSITIVITIES ARE REQUIRED. Performed at [facility], [place] [address], [place], [place] [address]    Report Status 03/24/2017 FINAL  Final  Blood Culture ID Panel (Reflexed)     Status: Abnormal   Collection Time: 03/22/17  4:08 AM  Result Value Ref Range Status   Enterococcus species NOT DETECTED NOT DETECTED Final   Listeria monocytogenes NOT DETECTED NOT DETECTED  Final   Staphylococcus species DETECTED (A) NOT DETECTED Final    Comment: Methicillin (oxacillin) resistant coagulase negative staphylococcus. Possible blood culture contaminant (unless isolated from more than one blood culture draw or clinical case suggests pathogenicity). No antibiotic treatment is indicated for blood  culture contaminants. CRITICAL RESULT CALLED TO, READ BACK BY AND VERIFIED WITH: V BRYK PHARMD 03/23/17 0225 JDW    Staphylococcus aureus NOT DETECTED NOT DETECTED Final   Methicillin resistance DETECTED (A) NOT DETECTED Final    Comment: CRITICAL RESULT CALLED TO, READ BACK BY AND VERIFIED WITH: V BRYK PHARMD 03/23/17 0225 JDW    Streptococcus species NOT DETECTED  NOT DETECTED Final   Streptococcus agalactiae NOT DETECTED NOT DETECTED Final   Streptococcus pneumoniae NOT DETECTED NOT DETECTED Final   Streptococcus pyogenes NOT DETECTED NOT DETECTED Final   Acinetobacter baumannii NOT DETECTED NOT DETECTED Final   Enterobacteriaceae species NOT DETECTED NOT DETECTED Final   Enterobacter cloacae complex NOT DETECTED NOT DETECTED Final   Escherichia coli NOT DETECTED NOT DETECTED Final   Klebsiella oxytoca NOT DETECTED NOT DETECTED Final   Klebsiella pneumoniae NOT DETECTED NOT DETECTED Final   Proteus species NOT DETECTED NOT DETECTED Final   Serratia marcescens NOT DETECTED NOT DETECTED Final   Haemophilus influenzae NOT DETECTED NOT DETECTED Final   Neisseria meningitidis NOT DETECTED NOT DETECTED Final   Pseudomonas aeruginosa NOT DETECTED NOT DETECTED Final   Candida albicans NOT DETECTED NOT DETECTED Final   Candida glabrata NOT DETECTED NOT DETECTED Final   Candida krusei NOT DETECTED NOT DETECTED Final   Candida parapsilosis NOT DETECTED NOT DETECTED Final   Candida tropicalis NOT DETECTED NOT DETECTED Final    Comment: Performed at [facility], [place] [address] [place], [place] [address]  Urine culture     Status: None   Collection Time: 03/22/17  4:10  AM  Result Value Ref Range Status   Specimen Description URINE, CATHETERIZED  Final   Special Requests NONE  Final   Culture   Final    NO GROWTH Performed at [facility], [address] [place], [place] [address]    Report Status 03/23/2017 FINAL  Final  MRSA PCR Screening     Status: Abnormal   Collection Time: 03/22/17  1:54 PM  Result Value Ref Range Status   MRSA by PCR POSITIVE (A) NEGATIVE Final    Comment:        The GeneXpert MRSA Assay (FDA approved for NASAL specimens only), is one component of a comprehensive MRSA colonization surveillance program. It is not intended to diagnose MRSA infection nor to guide or monitor treatment for MRSA infections. RESULT CALLED TO, READ BACK BY AND VERIFIED WITH: K.WEST,RN AT 1635 BY L.PITT 03/22/17   Culture, blood (routine x 2)     Status: None (Preliminary result)   Collection Time: 03/25/17  1:31 PM  Result Value Ref Range Status   Specimen Description BLOOD LEFT HAND  Final   Special Requests   Final    BOTTLES DRAWN AEROBIC AND ANAEROBIC Blood Culture adequate volume   Culture   Final    NO GROWTH 3 DAYS Performed at [facility], [address], [place], [place] [address]    Report Status PENDING  Incomplete  Culture, blood (routine x 2)     Status: None (Preliminary result)   Collection Time: 03/25/17  1:37 PM  Result Value Ref Range Status   Specimen Description BLOOD LEFT HAND  Final   Special Requests   Final    BOTTLES DRAWN AEROBIC AND ANAEROBIC Blood Culture results may not be optimal due to an excessive volume of blood received in culture bottles   Culture   Final    NO GROWTH 3 DAYS Performed at [facility], [place] [address] [place], [place] [address]    Report Status PENDING  Incomplete     Time coordinating discharge: Over 30 minutes  SIGNED:   Elmarie Shiley, MD  [facility] 03/28/2017, 11:49 AM Pager [phone]  If 7PM-7AM, please contact  night-coverage [email or web address] Password [email or web address]

## 2017-03-28 NOTE — Discharge Instructions (Signed)
Antibiotic Medicine, Adult  Antibiotic medicines treat infections caused by a type of germ called bacteria. They work by killing the bacteria that make you sick.  When do I need to take antibiotics?  You often need these medicines to treat bacterial infections, such as:  · A urinary tract infection (UTI).  · Strep throat.  · Meningitis. This affects the spinal cord and brain.  · A bad lung infection.    You may start the medicines while your doctor waits for tests to come back. When the tests come back, your doctor may change or stop your medicine.  When are antibiotics not needed?  You do not need these medicines for most common illnesses, such as:  · A cold.  · The flu.  · A sore throat.    Antibiotics are not always needed for all infections caused by bacteria. Do not ask for these medicines, or take them, when they are not needed.  What are the risks of taking antibiotics?  Most antibiotics can cause an infection called Clostridium difficile.This causes watery poop (diarrhea). Let your doctor know right away if:  · You have watery poop while taking an antibiotic.  · You have watery poop after you stop taking an antibiotic. The illness can happen weeks after you stop the medicine.    You also have a risk of getting an infection in the future that antibiotics cannot treat (antibiotic-resistant infection). This type of infection can be dangerous.  What else should I know about taking antibiotics?  · You need to take the entire prescription.  ? Take the medicine for as long as told by your doctor.  ? Do not stop taking it even if you start to feel better.  · Try not to miss any doses. If you miss a dose, call your doctor.  · Birth control pills may not work. If you take birth control pills:  ? Keep on taking them.  ? Use a second form of birth control, such as a condom. Do this for as long as told by your doctor.  · Ask your doctor:  ? How long to wait in between doses.  ? If you should take the medicine with  food.  ? If there is anything you should stay away from while taking the antibiotic, such as:  ? Food.  ? Drinks.  ? Medicines.  ? If there are any side effects you should watch for.  · Only take the medicines that your doctor told you to take. Do not take medicines that were given to someone else.  · Drink a large glass of water with the medicine.  · Ask the pharmacist for a tool to measure the medicine, such as:  ? A syringe.  ? A cup.  ? A spoon.  · Throw away any extra medicine.  Contact a doctor if:  · You get worse.  · You have new joint pain or muscle aches after starting the medicine.  · You have side effects from the medicine, such as:  ? Stomach pain.  ? Watery poop.  ? Feeling sick to your stomach (nausea).  Get help right away if:  · You have signs of a very bad allergic reaction. If this happens, stop taking the medicine right away. Signs may include:  ? Hives. These are raised, itchy, red bumps on the skin.  ? Skin rash.  ? Trouble breathing.  ? Wheezing.  ? Swelling.  ? Feeling dizzy.  ? Throwing up (  vomiting).  · Your pee (urine) is dark, or is the color of blood.  · Your skin turns yellow.  · You bruise easily.  · You bleed easily.  · You have very bad watery poop and cramps in your belly.  · You have a very bad headache.  Summary  · Antibiotics are often used to treat infections caused by bacteria.  · Only take these medicines when needed.  · Let your doctor know if you have watery poop while taking an antibiotic.  · You need to take the entire prescription.  This information is not intended to replace advice given to you by your health care provider. Make sure you discuss any questions you have with your health care provider.  Document Released: 10/06/2007 Document Revised: 12/30/2015 Document Reviewed: 12/30/2015  Elsevier Interactive Patient Education © 2017 Elsevier Inc.

## 2017-03-30 LAB — CULTURE, BLOOD (ROUTINE X 2)
Culture: NO GROWTH
Culture: NO GROWTH
SPECIAL REQUESTS: ADEQUATE

## 2017-05-19 ENCOUNTER — Emergency Department (HOSPITAL_COMMUNITY)
Admission: EM | Admit: 2017-05-19 | Discharge: 2017-05-19 | Disposition: A | Discharge status: Home / Self Care | Payer: Medicare Other | Attending: Emergency Medicine | Admitting: Emergency Medicine

## 2017-05-19 ENCOUNTER — Other Ambulatory Visit: Payer: Self-pay

## 2017-05-19 ENCOUNTER — Encounter (HOSPITAL_COMMUNITY): Payer: Self-pay | Admitting: *Deleted

## 2017-05-19 DIAGNOSIS — Z87891 Personal history of nicotine dependence: Secondary | ICD-10-CM | POA: Diagnosis not present

## 2017-05-19 DIAGNOSIS — K9420 Gastrostomy complication, unspecified: Secondary | ICD-10-CM | POA: Diagnosis not present

## 2017-05-19 DIAGNOSIS — Z79899 Other long term (current) drug therapy: Secondary | ICD-10-CM | POA: Diagnosis not present

## 2017-05-19 DIAGNOSIS — I110 Hypertensive heart disease with heart failure: Secondary | ICD-10-CM | POA: Diagnosis not present

## 2017-05-19 DIAGNOSIS — Z7982 Long term (current) use of aspirin: Secondary | ICD-10-CM | POA: Insufficient documentation

## 2017-05-19 DIAGNOSIS — I5022 Chronic systolic (congestive) heart failure: Secondary | ICD-10-CM | POA: Diagnosis not present

## 2017-05-19 HISTORY — DX: Cerebral infarction, unspecified: I63.9

## 2017-05-19 NOTE — ED Triage Notes (Signed)
Patient is a resident at [facility], states he went to bed his peg tube was in and when he woke up his peg tube was out. States he has had a peg tube x 1 year, never happened before.

## 2017-05-19 NOTE — ED Notes (Signed)
[facility] called to transport patient back to [facility].

## 2017-05-19 NOTE — ED Provider Notes (Signed)
[facility] EMERGENCY DEPARTMENT Provider Note   CSN: [id number] Arrival date & time: 05/19/17  0557     History   Chief Complaint No chief complaint on file.   HPI [name] [name] is a 72 y.o. male.  HPI 72 year old male who resides a nursing home.  He presents the emergency department today because his gastrostomy tube fell out sometime during the night.  He has no complaints of abdominal pain.  No other issues.  Requesting replacement of his gastrostomy tube.  Baseline 24 French gastrostomy tube   Past Medical History:  Diagnosis Date  . Chronic systolic CHF (congestive heart failure) ([place])   . CVA, old, aphasia   . Diabetes ([place])   . Hemiplegia ([place])   . HTN (hypertension)   . Intestinal obstruction ([place]) 11/2015  . Peripheral vascular disease ([place])   . Pneumonia   . Stroke [facility])     Patient Active Problem List   Diagnosis Date Noted  . Sepsis ([place])   . At risk for aspiration   . Palliative care by specialist   . Goals of care, counseling/discussion   . Acute respiratory failure with hypoxia ([place]) 03/22/2017  . CVA, old, aphasia 03/22/2017  . Acute on chronic systolic CHF (congestive heart failure) ([place]) 03/22/2017  . Mild renal insufficiency 03/22/2017  . AF (paroxysmal atrial fibrillation) ([place]) 03/22/2017    Past Surgical History:  Procedure Laterality Date  . ABDOMINAL ADHESION SURGERY  11/2015  . PERIPHERAL VASCULAR CATHETERIZATION     R-EIA stent, possibly R-Subclavian stent 2017        Home Medications    Prior to Admission medications   Medication Sig Start Date End Date Taking? Authorizing Provider  acetaminophen (TYLENOL) 325 MG tablet Place 650 mg into feeding tube every 6 (six) hours as needed for mild pain.    [provider]  amiodarone (PACERONE) 200 MG tablet Place 200 mg into feeding tube daily.    [provider]  aspirin 81 MG chewable tablet Place 81 mg into feeding tube daily.    [provider]  atorvastatin (LIPITOR) 40 MG tablet Place 40 mg into feeding tube daily.    [provider]  calcium-vitamin D (OSCAL WITH D) 500-200 MG-UNIT tablet Place 1 tablet into feeding tube daily with breakfast.    [provider]  carvedilol (COREG) 6.25 MG tablet Take 1 tablet (6.25 mg total) by mouth 2 (two) times daily with a meal. 03/28/17   Regalado, Belkys A, MD  famotidine (PEPCID) 20 MG tablet Place 20 mg into feeding tube daily.    [provider]  ferrous sulfate 300 (60 Fe) MG/5ML syrup Place 300 mg into feeding tube 2 (two) times daily with a meal.    [provider]  furosemide (LASIX) 20 MG tablet Place 2 tablets (40 mg total) into feeding tube daily. 03/28/17   Regalado, Belkys A, MD  gabapentin (NEURONTIN) 300 MG capsule Place 300 mg into feeding tube at bedtime.    [provider]  magnesium oxide (MAG-OX) 400 MG tablet Place 400 mg into feeding tube 2 (two) times daily.    [provider]  Menthol, Topical Analgesic, (BIOFREEZE) 4 % GEL Apply topically 2 (two) times daily. To left knee    [provider]  Multiple Vitamin (MULTIVITAMIN WITH MINERALS) TABS tablet Place 1 tablet into feeding tube daily.    [provider]  sacubitril-valsartan (ENTRESTO) 24-26 MG Take 1 tablet by mouth 2 (two) times daily.  03/28/17   Regalado, Belkys A, MD  spironolactone (ALDACTONE) 25 MG tablet Take 0.5 tablets (12.5 mg total) by mouth daily. 03/28/17   Regalado, Belkys A, MD  vitamin C (ASCORBIC ACID) 500 MG tablet Take 500 mg by mouth daily.    [provider]  Vitamin D, Ergocalciferol, (DRISDOL) 50000 units CAPS capsule Take 50,000 Units by mouth every 30 (thirty) days.    [provider]    Family History Family History  Problem Relation Age of Onset  . CAD Neg Hx     Social History Social History   Tobacco Use  . Smoking status: Former Research scientist (life sciences)  . Smokeless tobacco: Never Used  Substance  Use Topics  . Alcohol use: No    Frequency: Never  . Drug use: No     Allergies   Patient has no known allergies.   Review of Systems Review of Systems  All other systems reviewed and are negative.    Physical Exam Updated Vital Signs BP 134/74 (BP Location: Left Arm)   Pulse 65   Temp (!) 97.4 F (36.3 C) (Oral)   Resp 16   SpO2 100%   Physical Exam  Constitutional: He is oriented to person, place, and time. He appears well-developed and well-nourished.  HENT:  Head: Normocephalic.  Eyes: EOM are normal.  Neck: Normal range of motion.  Pulmonary/Chest: Effort normal.  Abdominal: He exhibits no distension.  Gastrostomy tube stoma left side of his abdomen without surrounding complications  Musculoskeletal: Normal range of motion.  Neurological: He is alert and oriented to person, place, and time.  Psychiatric: He has a normal mood and affect.  Nursing note and vitals reviewed.    ED Treatments / Results  Labs (all labs ordered are listed, but only abnormal results are displayed) Labs Reviewed - No data to display  EKG None  Radiology No results found.  Procedures Gastrostomy tube replacement Performed by: Jola Schmidt, MD Authorized by: Jola Schmidt, MD    Gastrostomy tube replacement Performed by: Jola Schmidt Consent: Verbal consent obtained. Risks and benefits: risks, benefits and alternatives were discussed Required items: required blood products, implants, devices, and special equipment available Patient identity confirmed: hospital-assigned identification number Time out: Immediately prior to procedure a "time out" was called to verify the correct patient, procedure, equipment, support staff and site/side marked as required. Preparation: Patient was prepped and draped in the usual sterile fashion. Patient tolerance: Patient tolerated the procedure well with no immediate complications.  Comments: 24 french Gastrostomy tube placed without  difficulty    Medications Ordered in ED Medications - No data to display   Initial Impression / Assessment and Plan / ED Course  I have reviewed the triage vital signs and the nursing notes.  Pertinent labs & imaging results that were available during my care of the patient were reviewed by me and considered in my medical decision making (see chart for details).     24 French gastrostomy tube replaced without difficulty.  Primary care follow-up   Final Clinical Impressions(s) / ED Diagnoses   Final diagnoses:  Complication of gastrostomy tube [facility])    ED Discharge Orders    None       Jola Schmidt, MD 05/19/17 [phone]

## 2017-05-20 ENCOUNTER — Other Ambulatory Visit: Payer: Self-pay

## 2017-05-20 ENCOUNTER — Emergency Department (HOSPITAL_COMMUNITY)
Admission: EM | Admit: 2017-05-20 | Discharge: 2017-05-20 | Disposition: A | Discharge status: Home / Self Care | Payer: Medicare Other | Attending: Emergency Medicine | Admitting: Emergency Medicine

## 2017-05-20 ENCOUNTER — Encounter (HOSPITAL_COMMUNITY): Payer: Self-pay | Admitting: Emergency Medicine

## 2017-05-20 ENCOUNTER — Emergency Department (HOSPITAL_COMMUNITY): Payer: Medicare Other

## 2017-05-20 DIAGNOSIS — I110 Hypertensive heart disease with heart failure: Secondary | ICD-10-CM | POA: Insufficient documentation

## 2017-05-20 DIAGNOSIS — E119 Type 2 diabetes mellitus without complications: Secondary | ICD-10-CM | POA: Insufficient documentation

## 2017-05-20 DIAGNOSIS — K9423 Gastrostomy malfunction: Secondary | ICD-10-CM | POA: Diagnosis not present

## 2017-05-20 DIAGNOSIS — Z79899 Other long term (current) drug therapy: Secondary | ICD-10-CM | POA: Diagnosis not present

## 2017-05-20 DIAGNOSIS — Z87891 Personal history of nicotine dependence: Secondary | ICD-10-CM | POA: Insufficient documentation

## 2017-05-20 DIAGNOSIS — I5023 Acute on chronic systolic (congestive) heart failure: Secondary | ICD-10-CM | POA: Insufficient documentation

## 2017-05-20 DIAGNOSIS — Z7982 Long term (current) use of aspirin: Secondary | ICD-10-CM | POA: Diagnosis not present

## 2017-05-20 DIAGNOSIS — K9420 Gastrostomy complication, unspecified: Secondary | ICD-10-CM | POA: Diagnosis present

## 2017-05-20 MED ORDER — IOPAMIDOL (ISOVUE-300) INJECTION 61%: 30.0000 mL | Freq: Once | INTRAVENOUS | Status: DC | PRN

## 2017-05-20 MED ORDER — IOHEXOL 300 MG/ML  SOLN
30.0000 mL | Freq: Once | INTRAMUSCULAR | Status: AC | PRN
Start: 2017-05-20 — End: 2017-05-20
  Administered 2017-05-20: 30 mL via ORAL

## 2017-05-20 NOTE — ED Triage Notes (Signed)
Pt BIB [facility] from [facility] bc staff states pt pulled his g-tube out. [facility] for [facility]. Facility reports being unable to give any meds bc they are given through g-tube. Pt A/O x4, non ambulatory.

## 2017-05-20 NOTE — ED Notes (Signed)
Dr. Alvino Chapel had attempted to insert a #24 P.E.G. Tube. He ended up inserting a #18 Gastric tube. I placed a new sterile dressing over P.E.G. Site.

## 2017-05-20 NOTE — ED Provider Notes (Signed)
[facility] DEPT Provider Note   CSN: [id number] Arrival date & time: 05/20/17  0546     History   Chief Complaint Chief Complaint  Patient presents with  . G tube removed    HPI [name] [name] is a 72 y.o. male.  HPI Patient presents for a G-tube that came out.  States that it is been out for an out an hour this morning.  States is not how how it came out.  States it was a little painful when it started but now does not hurt.  Seen yesterday morning for the same and had a replaced by Dr. Venora Maples.  Patient all his meds get taken through the G-tube.  Previous stroke. Past Medical History:  Diagnosis Date  . Chronic systolic CHF (congestive heart failure) ([place])   . CVA, old, aphasia   . Diabetes ([place])   . Hemiplegia ([place])   . HTN (hypertension)   . Intestinal obstruction ([place]) 11/2015  . Peripheral vascular disease ([place])   . Pneumonia   . Stroke [facility])     Patient Active Problem List   Diagnosis Date Noted  . Sepsis ([place])   . At risk for aspiration   . Palliative care by specialist   . Goals of care, counseling/discussion   . Acute respiratory failure with hypoxia ([place]) 03/22/2017  . CVA, old, aphasia 03/22/2017  . Acute on chronic systolic CHF (congestive heart failure) ([place]) 03/22/2017  . Mild renal insufficiency 03/22/2017  . AF (paroxysmal atrial fibrillation) ([place]) 03/22/2017    Past Surgical History:  Procedure Laterality Date  . ABDOMINAL ADHESION SURGERY  11/2015  . PERIPHERAL VASCULAR CATHETERIZATION     R-EIA stent, possibly R-Subclavian stent 2017        Home Medications    Prior to Admission medications   Medication Sig Start Date End Date Taking? Authorizing Provider  acetaminophen (TYLENOL) 325 MG tablet Place 650 mg into feeding tube every 6 (six) hours as needed for mild pain.    [provider]  amiodarone (PACERONE) 200 MG tablet Place 200 mg into feeding tube daily.    [provider]    aspirin 81 MG chewable tablet Place 81 mg into feeding tube daily.    [provider]  atorvastatin (LIPITOR) 40 MG tablet Place 40 mg into feeding tube daily.    [provider]  calcium-vitamin D (OSCAL WITH D) 500-200 MG-UNIT tablet Place 1 tablet into feeding tube daily with breakfast.    [provider]  carvedilol (COREG) 6.25 MG tablet Take 1 tablet (6.25 mg total) by mouth 2 (two) times daily with a meal. 03/28/17   Regalado, Belkys A, MD  famotidine (PEPCID) 20 MG tablet Place 20 mg into feeding tube daily.    [provider]  ferrous sulfate 300 (60 Fe) MG/5ML syrup Place 300 mg into feeding tube 2 (two) times daily with a meal.    [provider]  furosemide (LASIX) 20 MG tablet Place 2 tablets (40 mg total) into feeding tube daily. 03/28/17   Regalado, Belkys A, MD  gabapentin (NEURONTIN) 300 MG capsule Place 300 mg into feeding tube at bedtime.    [provider]  magnesium oxide (MAG-OX) 400 MG tablet Place 400 mg into feeding tube 2 (two) times daily.    [provider]  Menthol, Topical Analgesic, (BIOFREEZE) 4 % GEL Apply topically 2 (two) times daily. To left knee    [provider]  Multiple Vitamin (MULTIVITAMIN WITH MINERALS)  TABS tablet Place 1 tablet into feeding tube daily.    [provider]  sacubitril-valsartan (ENTRESTO) 24-26 MG Take 1 tablet by mouth 2 (two) times daily. 03/28/17   Regalado, Belkys A, MD  spironolactone (ALDACTONE) 25 MG tablet Take 0.5 tablets (12.5 mg total) by mouth daily. 03/28/17   Regalado, Belkys A, MD  vitamin C (ASCORBIC ACID) 500 MG tablet Take 500 mg by mouth daily.    [provider]  Vitamin D, Ergocalciferol, (DRISDOL) 50000 units CAPS capsule Take 50,000 Units by mouth every 30 (thirty) days.    [provider]    Family History Family History  Problem Relation Age of Onset  . CAD Neg Hx     Social History Social History   Tobacco Use   . Smoking status: Former Research scientist (life sciences)  . Smokeless tobacco: Never Used  Substance Use Topics  . Alcohol use: No    Frequency: Never  . Drug use: No     Allergies   Patient has no known allergies.   Review of Systems Review of Systems  Constitutional: Negative for fever.  Gastrointestinal: Positive for abdominal pain. Negative for vomiting.     Physical Exam Updated Vital Signs BP (!) 156/72 (BP Location: Left Arm)   Pulse 68   Temp 98 F (36.7 C) (Oral)   Resp 16   Ht 5\' 11"  (1.803 m)   Wt 77.1 kg (170 lb)   SpO2 96%   BMI 23.71 kg/m   Physical Exam  Constitutional: He appears well-developed.  Abdominal:  Site of previous G-tube and left upper quadrant.  No erythema.  No bleeding.  No abdominal tenderness.  Neurological: He is alert.  Skin: Capillary refill takes less than 2 seconds.     ED Treatments / Results  Labs (all labs ordered are listed, but only abnormal results are displayed) Labs Reviewed - No data to display  EKG None  Radiology No results found.  Procedures Procedures (including critical care time)  Medications Ordered in ED Medications  iohexol (OMNIPAQUE) 300 MG/ML solution 30 mL (30 mLs Oral Contrast Given 05/20/17 0758)     Initial Impression / Assessment and Plan / ED Course  I have reviewed the triage vital signs and the nursing notes.  Pertinent labs & imaging results that were available during my care of the patient were reviewed by me and considered in my medical decision making (see chart for details).    Patient with G-tube that had come out for the second time in 2 days.  Was 60 [place] previously but that would not fit through the stoma.  16 French placed.  Potentially could need larger tube if this can be done through the nursing home.  Tube verification by x-ray.  Final Clinical Impressions(s) / ED Diagnoses   Final diagnoses:  PEG tube malfunction [facility])    ED Discharge Orders    None       Davonna Belling,  MD 05/20/17 [phone]

## 2017-10-17 ENCOUNTER — Non-Acute Institutional Stay: Payer: Medicare Other | Admitting: Primary Care

## 2017-10-17 DIAGNOSIS — Z7189 Other specified counseling: Secondary | ICD-10-CM

## 2017-10-17 DIAGNOSIS — I69391 Dysphagia following cerebral infarction: Secondary | ICD-10-CM

## 2017-10-17 DIAGNOSIS — Z515 Encounter for palliative care: Secondary | ICD-10-CM

## 2017-10-17 NOTE — Progress Notes (Signed)
PALLIATIVE CARE CONSULT VISIT   PATIENT NAME: [name] [name] DOB: June 23, 1945 MRN: [id number]  PRIMARY CARE PROVIDER:   Garwin Brothers, MD  REFERRING PROVIDER:  Garwin Brothers, MD [name] [name] [address] [place], [name] [name] [address]  RESPONSIBLE PARTY:   [name] [name] [phone]  ASSESSMENT:     1.Goals of Care:  Interactive,  bedbound (baseline), less ability to assist self with any adls. Staff reports no precipitous decline or concerns.   2. Pain: Patient states his left leg hurts with shooting pains. States frequent pain. He is currently taking 200 mg gabapentin VT daily per facility notes. Patient goals for pain relief not fully met. Needs neuropathic pain addressed.  3. Nutrition: eating some PO but most of calories are Via feeding tube. Patient enjoys po food as he can tolerate.  VS=[id number]   RECOMMENDATIONS and PLAN:  1. Goals of care: patient appears to be making some decline in function, intake and pain control. 2.Pain: Recommend increasing gabapentin on a titration to better effect vis a vis somnolence. Pt states he is willing to be drowsy for better pain control. Recommend ATC acetaminophen for better pain control, 500-650 mg q 8 hours.  3. Nutrition Continue with current nutritional plan.  Patient states he enjoys eating what he can. Discussed above with NP in the building. Discussed with son [name] [name](by phone)  patient's desire for  continued full code status.States patient does not feel his work here is done yet. Palliative care will continue to follow to monitor for decline and to discuss end of life planning with family as needed.  I spent 25  minutes providing this consultation,  from 1025  to 1050. More than 50% of the time in this consultation was spent coordinating communication.   HISTORY OF PRESENT ILLNESS:  [name] [name] is a 72 y.o. year old male with multiple medical problems including chronic systolic chf, stroke, hemiplegia,HTN, peripheral vascular disease.  Palliative Care was asked to help address goals of care.   CODE STATUS: Full  PPS: 20% HOSPICE ELIGIBILITY/DIAGNOSIS: TBD  PAST MEDICAL HISTORY:  Past Medical History:  Diagnosis Date  . Chronic systolic CHF (congestive heart failure) ([place])   . CVA, old, aphasia   . Diabetes ([place])   . Hemiplegia ([place])   . HTN (hypertension)   . Intestinal obstruction ([place]) 11/2015  . Peripheral vascular disease ([place])   . Pneumonia   . Stroke [facility])     SOCIAL HX:  Social History   Tobacco Use  . Smoking status: Former Research scientist (life sciences)  . Smokeless tobacco: Never Used  Substance Use Topics  . Alcohol use: No    Frequency: Never    ALLERGIES: No Known Allergies   PERTINENT MEDICATIONS:  Outpatient Encounter Medications as of 10/17/2017  Medication Sig  . acetaminophen (TYLENOL) 325 MG tablet Place 650 mg into feeding tube every 6 (six) hours as needed for mild pain.  Marland Kitchen amiodarone (PACERONE) 200 MG tablet Place 200 mg into feeding tube daily.  Marland Kitchen aspirin 81 MG chewable tablet Place 81 mg into feeding tube daily.  Marland Kitchen atorvastatin (LIPITOR) 40 MG tablet Place 40 mg into feeding tube daily.  . calcium-vitamin D (OSCAL WITH D) 500-200 MG-UNIT tablet Place 1 tablet into feeding tube daily with breakfast.  . carvedilol (COREG) 6.25 MG tablet Take 1 tablet (6.25 mg total) by mouth 2 (two) times daily with a meal.  . famotidine (PEPCID) 20 MG tablet Place 20 mg into feeding tube daily.  . ferrous sulfate 300 (60  Fe) MG/5ML syrup Place 300 mg into feeding tube 2 (two) times daily with a meal.  . furosemide (LASIX) 20 MG tablet Place 2 tablets (40 mg total) into feeding tube daily.  Marland Kitchen gabapentin (NEURONTIN) 300 MG capsule Place 300 mg into feeding tube at bedtime.  . magnesium oxide (MAG-OX) 400 MG tablet Place 400 mg into feeding tube 2 (two) times daily.  . Menthol, Topical Analgesic, (BIOFREEZE) 4 % GEL Apply topically 2 (two) times daily. To left knee  . Multiple Vitamin (MULTIVITAMIN WITH MINERALS) TABS  tablet Place 1 tablet into feeding tube daily.  . sacubitril-valsartan (ENTRESTO) 24-26 MG Take 1 tablet by mouth 2 (two) times daily.  Marland Kitchen spironolactone (ALDACTONE) 25 MG tablet Take 0.5 tablets (12.5 mg total) by mouth daily.  . vitamin C (ASCORBIC ACID) 500 MG tablet Take 500 mg by mouth daily.  . Vitamin D, Ergocalciferol, (DRISDOL) 50000 units CAPS capsule Take 50,000 Units by mouth every 30 (thirty) days.   No facility-administered encounter medications on file as of 10/17/2017.     PHYSICAL EXAM:   General: States pain in Right LE, frail appearing Cardiovascular: regular rate and rhythm, S1S2, no gallop Pulmonary: Clear to ascultation in all fields. Abdomen: soft, nontender, + bowel sounds, g tube in place Extremities: no edema, no skin breakdown on heels Neurological: Weakness, deficits from earlier CVA   [name] AGPCNP-BC

## 2017-11-22 ENCOUNTER — Non-Acute Institutional Stay: Payer: Medicare Other | Admitting: Primary Care

## 2017-11-22 DIAGNOSIS — Z515 Encounter for palliative care: Secondary | ICD-10-CM

## 2017-11-22 DIAGNOSIS — I69320 Aphasia following cerebral infarction: Secondary | ICD-10-CM

## 2017-11-22 DIAGNOSIS — I69391 Dysphagia following cerebral infarction: Secondary | ICD-10-CM

## 2017-11-22 NOTE — Progress Notes (Signed)
Community Palliative Care Telephone: [phone] Fax: [phone]  PATIENT NAME: [name] [name] DOB: [birth year] MRN: [id number]  PRIMARY CARE PROVIDER:   Garwin Brothers, MD  REFERRING PROVIDER:  Garwin Brothers, MD [place], [place] [address]  RESPONSIBLE PARTY:   son Trooper, [place] [phone]    ASSESSMENT and RECOMMENDATIONS:  1. Pain management: Recommend tramadol 50 mg q 8 hrs prn pain.States better relief with ATC tylenol 650 mg tid but still has pain rated at 8/10.    2. Mobility: Encouraged more activity to decrease limb pain. W/c has lost power charger. Called manufacturer and it will cost around $190 to replace. Pt states he'd get up more if he had the chair,called [facility] to discuss. Also reported loss to SNF.  3. Goals of Care: Continue to support plan of care for comfort measures. Son [name] has moved back to area and will be able to meet at facility, perhaps for care planning meeting. Will continue to follow,  1-2 months.  I spent 25 minutes providing this consultation,  from 1000 to 1025. More than 50% of the time in this consultation was spent coordinating communication. Phone call to son to discuss findings and plan.  HISTORY OF PRESENT ILLNESS:  [name] [name] is a 72 y.o. year old male with multiple medical problems including CVA, CHF, DM2 . Palliative Care was asked to help address goals of care.   CODE STATUS:  FULL  PPS: 30% HOSPICE ELIGIBILITY/DIAGNOSIS: TBD  PAST MEDICAL HISTORY:  Past Medical History:  Diagnosis Date  . Chronic systolic CHF (congestive heart failure) ([place])   . CVA, old, aphasia   . Diabetes ([place])   . Hemiplegia ([place])   . HTN (hypertension)   . Intestinal obstruction ([place]) 11/2015  . Peripheral vascular disease ([place])   . Pneumonia   . Stroke [facility])     SOCIAL HX:  Social History   Tobacco Use  . Smoking status: Former Research scientist (life sciences)  . Smokeless tobacco: Never Used  Substance Use Topics  . Alcohol use: No   Frequency: Never    ALLERGIES: No Known Allergies   PERTINENT MEDICATIONS:  Outpatient Encounter Medications as of 11/22/2017  Medication Sig  . acetaminophen (TYLENOL) 325 MG tablet Place 650 mg into feeding tube every 6 (six) hours as needed for mild pain.  Marland Kitchen amiodarone (PACERONE) 200 MG tablet Place 200 mg into feeding tube daily.  Marland Kitchen aspirin 81 MG chewable tablet Place 81 mg into feeding tube daily.  Marland Kitchen atorvastatin (LIPITOR) 40 MG tablet Place 40 mg into feeding tube daily.  . calcium-vitamin D (OSCAL WITH D) 500-200 MG-UNIT tablet Place 1 tablet into feeding tube daily with breakfast.  . carvedilol (COREG) 6.25 MG tablet Take 1 tablet (6.25 mg total) by mouth 2 (two) times daily with a meal.  . famotidine (PEPCID) 20 MG tablet Place 20 mg into feeding tube daily.  . ferrous sulfate 300 (60 Fe) MG/5ML syrup Place 300 mg into feeding tube 2 (two) times daily with a meal.  . furosemide (LASIX) 20 MG tablet Place 2 tablets (40 mg total) into feeding tube daily.  Marland Kitchen gabapentin (NEURONTIN) 300 MG capsule Place 300 mg into feeding tube at bedtime.  . magnesium oxide (MAG-OX) 400 MG tablet Place 400 mg into feeding tube 2 (two) times daily.  . Menthol, Topical Analgesic, (BIOFREEZE) 4 % GEL Apply topically 2 (two) times daily. To left knee  . Multiple Vitamin (MULTIVITAMIN WITH MINERALS) TABS tablet Place 1 tablet  into feeding tube daily.  . sacubitril-valsartan (ENTRESTO) 24-26 MG Take 1 tablet by mouth 2 (two) times daily.  Marland Kitchen spironolactone (ALDACTONE) 25 MG tablet Take 0.5 tablets (12.5 mg total) by mouth daily.  . vitamin C (ASCORBIC ACID) 500 MG tablet Take 500 mg by mouth daily.  . Vitamin D, Ergocalciferol, (DRISDOL) 50000 units CAPS capsule Take 50,000 Units by mouth every 30 (thirty) days.   No facility-administered encounter medications on file as of 11/22/2017.     PHYSICAL EXAM:  VS 98.1-82-18 128/74 95% RA  General: NAD, WNWD appearing,  Cardiovascular: regular rate and  rhythm, S1 S2 Pulmonary: clear all fields Abdomen: soft, nontender, + bowel sounds. Tube for feeding in tact GU: no suprapubic tenderness Extremities: no edema, joint deformities, pain and loss of function Skin: no rashes. Denies wounds Neurological: Weakness , forgetful, loss of strength in LE, alert, oriented x 2.  [name] DNP AGPCNP-BC

## 2017-12-26 ENCOUNTER — Non-Acute Institutional Stay: Payer: Medicare Other | Admitting: Primary Care

## 2017-12-28 ENCOUNTER — Non-Acute Institutional Stay: Payer: Medicare Other | Admitting: Primary Care

## 2017-12-28 DIAGNOSIS — Z515 Encounter for palliative care: Secondary | ICD-10-CM

## 2017-12-28 DIAGNOSIS — I69320 Aphasia following cerebral infarction: Secondary | ICD-10-CM

## 2017-12-28 DIAGNOSIS — I69391 Dysphagia following cerebral infarction: Secondary | ICD-10-CM

## 2017-12-28 DIAGNOSIS — Z9189 Other specified personal risk factors, not elsewhere classified: Secondary | ICD-10-CM

## 2017-12-28 NOTE — Progress Notes (Signed)
Community [facility] Telephone: [phone] Fax: [phone]  PATIENT NAME: [name] [name] DOB: 01-01-1946 MRN: [id number]  PRIMARY CARE PROVIDER:   Garwin Brothers, MD  REFERRING PROVIDER:  Garwin Brothers, MD [place], [place] [address]  RESPONSIBLE PARTY:    Extended Emergency Contact Information Primary Emergency Contact: [name],[name] Address: [address]          [place], [place] [address] [name] Phone: [phone] Relation: Daughter Secondary Emergency Contact: Lamarr Lulas Address: unknown  [place] Phone: [phone] Relation: Son  ASSESSMENT and RECOMMENDATIONS:   1. Pain management: States better relief with ATC tylenol 650 mg tid and rates no pain. States he also uses tramadol with success.  2. Mobility: Encouraged more activity to decrease limb pain. w/c power charger is now attached to chair and charging. Pt states he does not get up much. Encouraged to increase his time OOB to limit sequelae of immobility.  3. Goals of Care: Continue to support plan of care for comfort measures. States he is going to son's for holidays but hopes to return to [place]. Will f/u with Goal of Care discussion with POA Lamarr Lulas re hs questions about community resources and case management while patient is in son's home  Community palliative medicine service will continue to follow with goals of care planning, symptom management,  return 1-2 months.  I spent 15 minutes providing this consultation,  from 1400   to 1415. More than 50% of the time in this consultation was spent coordinating communication.   HISTORY OF PRESENT ILLNESS:  [name] [name] is a 72 y.o. year old male with multiple medical problems including CVA, CHF, DM2.Palliative Care was asked to help address goals of care.   CODE STATUS:  FULL  PPS: 40% HOSPICE ELIGIBILITY/DIAGNOSIS: TBD  PAST MEDICAL HISTORY:  Past Medical History:  Diagnosis Date  .  Chronic systolic CHF (congestive heart failure) ([place])   . CVA, old, aphasia   . Diabetes ([place])   . Hemiplegia ([place])   . HTN (hypertension)   . Intestinal obstruction ([place]) 11/2015  . Peripheral vascular disease ([place])   . Pneumonia   . Stroke [facility])     SOCIAL HX:  Social History   Tobacco Use  . Smoking status: Former Research scientist (life sciences)  . Smokeless tobacco: Never Used  Substance Use Topics  . Alcohol use: No    Frequency: Never    ALLERGIES: No Known Allergies   PERTINENT MEDICATIONS:  Outpatient Encounter Medications as of 12/28/2017  Medication Sig  . acetaminophen (TYLENOL) 325 MG tablet Place 650 mg into feeding tube every 6 (six) hours as needed for mild pain.  Marland Kitchen amiodarone (PACERONE) 200 MG tablet Place 200 mg into feeding tube daily.  Marland Kitchen aspirin 81 MG chewable tablet Place 81 mg into feeding tube daily.  Marland Kitchen atorvastatin (LIPITOR) 40 MG tablet Place 40 mg into feeding tube daily.  . calcium-vitamin D (OSCAL WITH D) 500-200 MG-UNIT tablet Place 1 tablet into feeding tube daily with breakfast.  . carvedilol (COREG) 6.25 MG tablet Take 1 tablet (6.25 mg total) by mouth 2 (two) times daily with a meal.  . famotidine (PEPCID) 20 MG tablet Place 20 mg into feeding tube daily.  . ferrous sulfate 300 (60 Fe) MG/5ML syrup Place 300 mg into feeding tube 2 (two) times daily with a meal.  . furosemide (LASIX) 20 MG tablet Place 2 tablets (40 mg total) into feeding tube daily.  Marland Kitchen gabapentin (NEURONTIN)  300 MG capsule Place 300 mg into feeding tube at bedtime.  . magnesium oxide (MAG-OX) 400 MG tablet Place 400 mg into feeding tube 2 (two) times daily.  . Menthol, Topical Analgesic, (BIOFREEZE) 4 % GEL Apply topically 2 (two) times daily. To left knee  . Multiple Vitamin (MULTIVITAMIN WITH MINERALS) TABS tablet Place 1 tablet into feeding tube daily.  . sacubitril-valsartan (ENTRESTO) 24-26 MG Take 1 tablet by mouth 2 (two) times daily.  Marland Kitchen spironolactone (ALDACTONE) 25 MG tablet Take 0.5 tablets  (12.5 mg total) by mouth daily.  . vitamin C (ASCORBIC ACID) 500 MG tablet Take 500 mg by mouth daily.  . Vitamin D, Ergocalciferol, (DRISDOL) 50000 units CAPS capsule Take 50,000 Units by mouth every 30 (thirty) days.   No facility-administered encounter medications on file as of 12/28/2017.     PHYSICAL EXAM:  VS 97.3-72-18 128/75 Oxygen 95% RA Weight unavailable. General: NAD, WNWD well-appearing, Alert /oriented x 3.  Cardiovascular: regular rate and rhythm, S1S2 Pulmonary: Rhonchi all fields, productive cough Abdomen: soft, nontender, + bowel sounds, denies constipation. Feeding tube for calories, eats po as able.  Extremities: L 1+ edema, LE joint deformities Skin: no rashes, healed lesions per pt.  Neurological: Weakness, paresis, dysarthria  [name] DNP AGPCNP-BC

## 2018-01-15 ENCOUNTER — Emergency Department (HOSPITAL_COMMUNITY): Payer: Medicare Other

## 2018-01-15 ENCOUNTER — Encounter (HOSPITAL_COMMUNITY): Payer: Self-pay | Admitting: Emergency Medicine

## 2018-01-15 ENCOUNTER — Emergency Department (HOSPITAL_COMMUNITY)
Admission: EM | Admit: 2018-01-15 | Discharge: 2018-01-15 | Disposition: A | Discharge status: Home / Self Care | Payer: Medicare Other | Attending: Emergency Medicine | Admitting: Emergency Medicine

## 2018-01-15 DIAGNOSIS — I110 Hypertensive heart disease with heart failure: Secondary | ICD-10-CM | POA: Diagnosis not present

## 2018-01-15 DIAGNOSIS — I480 Paroxysmal atrial fibrillation: Secondary | ICD-10-CM | POA: Diagnosis not present

## 2018-01-15 DIAGNOSIS — E1151 Type 2 diabetes mellitus with diabetic peripheral angiopathy without gangrene: Secondary | ICD-10-CM | POA: Diagnosis not present

## 2018-01-15 DIAGNOSIS — I5022 Chronic systolic (congestive) heart failure: Secondary | ICD-10-CM | POA: Diagnosis not present

## 2018-01-15 DIAGNOSIS — Z7982 Long term (current) use of aspirin: Secondary | ICD-10-CM | POA: Diagnosis not present

## 2018-01-15 DIAGNOSIS — Z87891 Personal history of nicotine dependence: Secondary | ICD-10-CM | POA: Insufficient documentation

## 2018-01-15 DIAGNOSIS — Z79899 Other long term (current) drug therapy: Secondary | ICD-10-CM | POA: Insufficient documentation

## 2018-01-15 DIAGNOSIS — K9420 Gastrostomy complication, unspecified: Secondary | ICD-10-CM

## 2018-01-15 DIAGNOSIS — K9429 Other complications of gastrostomy: Secondary | ICD-10-CM | POA: Diagnosis present

## 2018-01-15 NOTE — ED Notes (Signed)
Called [facility] Paper work complete

## 2018-01-15 NOTE — ED Notes (Signed)
Bed: RE61 Expected date:  Expected time:  Means of arrival:  Comments: 38M- replace feeding tube

## 2018-01-15 NOTE — ED Notes (Signed)
Called [facility] no answer,  Called fedeline NP  [phone] no answer, no option to leave message

## 2018-01-15 NOTE — ED Triage Notes (Signed)
Pt comes to ed via ems, c/o peg tube came out today, tonight, facility tried to put it back and covered it with dry clean gauze tape. V/s 148/74. Hr 76, spo2 97 room air  , cbg 121.  Alert x4 and verbalizes does not want to be here. Comes from [facility].

## 2018-01-15 NOTE — Discharge Instructions (Addendum)
You have a 20 [place] gastrostomy tube in place.  Recheck if you have further problems.

## 2018-01-15 NOTE — ED Provider Notes (Signed)
[facility] DEPT Provider Note   CSN: [id number] Arrival date & time: 01/15/18  0222  Time seen 12:47 AM   History   Chief Complaint Chief Complaint  Patient presents with  . peg tube out     HPI Dewain Platz is a 73 y.o. male.  HPI patient has a chronic PEG tube which came out tonight.  Patient does not know how long it has been out.  Evidently the nursing home tried to reinsert it and were unable to get the tube to pass.  PCP Garwin Brothers, MD   Past Medical History:  Diagnosis Date  . Chronic systolic CHF (congestive heart failure) ([place])   . CVA, old, aphasia   . Diabetes ([place])   . Hemiplegia ([place])   . HTN (hypertension)   . Intestinal obstruction ([place]) 11/2015  . Peripheral vascular disease ([place])   . Pneumonia   . Stroke [facility])     Patient Active Problem List   Diagnosis Date Noted  . Sepsis ([place])   . At risk for aspiration   . Palliative care by specialist   . Goals of care, counseling/discussion   . Acute respiratory failure with hypoxia ([place]) 03/22/2017  . CVA, old, aphasia 03/22/2017  . Acute on chronic systolic CHF (congestive heart failure) ([place]) 03/22/2017  . Mild renal insufficiency 03/22/2017  . AF (paroxysmal atrial fibrillation) ([place]) 03/22/2017    Past Surgical History:  Procedure Laterality Date  . ABDOMINAL ADHESION SURGERY  11/2015  . PERIPHERAL VASCULAR CATHETERIZATION     R-EIA stent, possibly R-Subclavian stent 2017        Home Medications    Prior to Admission medications   Medication Sig Start Date End Date Taking? Authorizing Provider  acetaminophen (TYLENOL) 325 MG tablet Place 650 mg into feeding tube every 6 (six) hours as needed for mild pain.   Yes [provider]  acidophilus (RISAQUAD) CAPS capsule Take 1 capsule by mouth daily.   Yes [provider]  amiodarone (PACERONE) 200 MG tablet Place 200 mg into feeding tube daily.   Yes [provider]  aspirin 81 MG  chewable tablet Place 81 mg into feeding tube daily.   Yes [provider]  atorvastatin (LIPITOR) 40 MG tablet Place 40 mg into feeding tube daily.   Yes [provider]  carvedilol (COREG) 6.25 MG tablet Take 1 tablet (6.25 mg total) by mouth 2 (two) times daily with a meal. 03/28/17  Yes Regalado, Belkys A, MD  Cholecalciferol (VITAMIN D) 50 MCG (2000 UT) CAPS Take 2,000 Units by mouth daily.   Yes [provider]  famotidine (PEPCID) 20 MG tablet Place 20 mg into feeding tube daily.   Yes [provider]  ferrous sulfate 300 (60 Fe) MG/5ML syrup Place 300 mg into feeding tube 2 (two) times daily with a meal.   Yes [provider]  furosemide (LASIX) 20 MG tablet Place 2 tablets (40 mg total) into feeding tube daily. 03/28/17  Yes Regalado, Belkys A, MD  gabapentin (NEURONTIN) 400 MG capsule Take 400 mg by mouth 2 (two) times daily.   Yes [provider]  magnesium oxide (MAG-OX) 400 MG tablet Place 400 mg into feeding tube 2 (two) times daily.   Yes [provider]  Menthol, Topical Analgesic, (BIOFREEZE) 4 % GEL Apply topically 2 (two) times daily. To left knee   Yes [provider]  Multiple Vitamin (MULTIVITAMIN WITH MINERALS) TABS tablet Place 1 tablet into feeding tube daily.  Yes [provider]  sacubitril-valsartan (ENTRESTO) 24-26 MG Take 1 tablet by mouth 2 (two) times daily. 03/28/17  Yes Regalado, Belkys A, MD  vitamin C (ASCORBIC ACID) 500 MG tablet Take 500 mg by mouth daily.   Yes [provider]  spironolactone (ALDACTONE) 25 MG tablet Take 0.5 tablets (12.5 mg total) by mouth daily. Patient not taking: Reported on 01/15/2018 03/28/17   Elmarie Shiley, MD    Family History Family History  Problem Relation Age of Onset  . CAD Neg Hx     Social History Social History   Tobacco Use  . Smoking status: Former Research scientist (life sciences)  . Smokeless tobacco: Never Used  Substance Use Topics  . Alcohol  use: No    Frequency: Never  . Drug use: No  Lives in a nursing home   Allergies   Patient has no known allergies.   Review of Systems Review of Systems  All other systems reviewed and are negative.    Physical Exam Updated Vital Signs BP (!) 173/81   Pulse 71   Temp 98.2 F (36.8 C)   Resp 17   SpO2 97%   Vital signs normal except for hypertension   Physical Exam Vitals signs and nursing note reviewed.  Constitutional:      Comments: Watching TV in no distress  HENT:     Head: Normocephalic and atraumatic.     Right Ear: External ear normal.     Left Ear: External ear normal.     Nose: Nose normal.  Eyes:     Extraocular Movements: Extraocular movements intact.     Conjunctiva/sclera: Conjunctivae normal.  Neck:     Musculoskeletal: Normal range of motion.  Cardiovascular:     Rate and Rhythm: Normal rate.  Pulmonary:     Effort: Pulmonary effort is normal. No respiratory distress.  Abdominal:     Comments: Patient has gauze dressing in his left upper quadrant, it was removed and the dimpling from the prior PEG placement was seen.  Skin:    General: Skin is warm and dry.  Neurological:     Mental Status: He is alert.  Psychiatric:        Mood and Affect: Mood normal.        Behavior: Behavior normal.        Thought Content: Thought content normal.      ED Treatments / Results  Labs (all labs ordered are listed, but only abnormal results are displayed) Labs Reviewed - No data to display  EKG None  Radiology Dg Abdomen 1 View  Result Date: 01/15/2018 CLINICAL DATA:  Gastrostomy tube replacement EXAM: ABDOMEN - 1 VIEW COMPARISON:  Earlier today FINDINGS: Gastrostomy tube over the left upper quadrant, which is normal for this patient when compared with remote study 05/20/2017. Previously injected contrast has diffused within small bowel loops. No peritoneal contrast is seen. Contrast left of the retention balloon on immediate prior is favored to be  intragastric when correlated with abdominal CT 12/09/2015 and tube injection May 2019. There is diffuse formed stool compatible with constipation. Bilateral iliac stenting and IVC filter. IMPRESSION: 1. Progression of injected contrast within small bowel loops. No evident peritoneal extravasation. 2. Constipation. Electronically Signed   By: Monte Fantasia M.D.   On: 01/15/2018 06:14   Dg Abdomen 1 View  Result Date: 01/15/2018 CLINICAL DATA:  PEG placement. EXAM: ABDOMEN - 1 VIEW COMPARISON:  Radiograph 05/20/2017 FINDINGS: AP view of the abdomen obtained after installation of 30  cc Omnipaque 300. Contrast opacifies the stomach and duodenum. There is also patchy contrast extending along the course of the gastrostomy tubing and with thin linear wisp coursing in the left upper abdomen. Nonobstructive bowel gas pattern. IVC filter in place. Bi-iliac stents. IMPRESSION: 1. Gastrostomy tube in the stomach with contrast opacifying the stomach and duodenum. 2. Patchy contrast is visualized along the course of the gastrostomy tubing with a thin linear wisp coursing in the left upper abdomen. This is indeterminate for leak versus external skin contaminant. Recommend repeat exam after cleaning the skin. Electronically Signed   By: Keith Rake M.D.   On: 01/15/2018 03:55    Procedures Gastrostomy tube replacement Date/Time: 01/15/2018 2:58 AM Performed by: Rolland Porter, MD Authorized by: Rolland Porter, MD  Consent: Verbal consent obtained. Consent given by: patient Patient identity confirmed: verbally with patient, arm band, provided demographic data and hospital-assigned identification number Time out: Immediately prior to procedure a "time out" was called to verify the correct patient, procedure, equipment, support staff and site/side marked as required. Preparation: Patient was prepped and draped in the usual sterile fashion. Local anesthesia used: no  Anesthesia: Local anesthesia used:  no  Sedation: Patient sedated: no  Comments: I was unable to get the 24 [place] to go through the opening, the 16 [place] was used and easily passed I assume to to the stomach although no stomach contents came out.  The tube was clamped off and x-ray was ordered.  The balloon was filled with 10 cc of sterile saline.  Gastrostomy tube replacement Date/Time: 01/15/2018 5:33 AM Performed by: Rolland Porter, MD Authorized by: Rolland Porter, MD  Consent: Verbal consent obtained. Consent given by: patient Patient identity confirmed: verbally with patient, arm band, provided demographic data and hospital-assigned identification number Time out: Immediately prior to procedure a "time out" was called to verify the correct patient, procedure, equipment, support staff and site/side marked as required. Preparation: Patient was prepped and draped in the usual sterile fashion. Local anesthesia used: no  Anesthesia: Local anesthesia used: no  Sedation: Patient sedated: no  Patient tolerance: Patient tolerated the procedure well with no immediate complications Comments: # 20 gauge Gastrostomy tube was placed without difficulty.  10 cc sterile saline was placed in the balloon.  Repeat x-ray was done.    (including critical care time)  Medications Ordered in ED Medications - No data to display   Initial Impression / Assessment and Plan / ED Course  I have reviewed the triage vital signs and the nursing notes.  Pertinent labs & imaging results that were available during my care of the patient were reviewed by me and considered in my medical decision making (see chart for details).     I reviewed patient's prior charts and he has had a 32 [place] tube placed before and when it came out and he returned the following day they were unable to get the 24 and he had a 16 [place] placed.  Both were at bedside.  5:20 AM nursing staff found and actual gastrostomy tube #20 [place].  When I went to change it out  however the prior 16-gauge Catheter was laying on his abdomen and the balloon was ruptured.  This must be the reason there was some extravasation seen on the initial x-ray.  Final Clinical Impressions(s) / ED Diagnoses   Final diagnoses:  Gastrostomy complication [facility])    ED Discharge Orders    None      Plan discharge  Rolland Porter, MD,  Barbette Or, MD 01/15/18 [phone]

## 2018-03-07 ENCOUNTER — Non-Acute Institutional Stay: Payer: Medicare Other | Admitting: Adult Health Nurse Practitioner

## 2018-03-07 ENCOUNTER — Telehealth: Payer: Self-pay | Admitting: Adult Health Nurse Practitioner

## 2018-03-07 DIAGNOSIS — Z515 Encounter for palliative care: Secondary | ICD-10-CM

## 2018-03-07 NOTE — Telephone Encounter (Signed)
Tried calling daughter to update on patient status.  Daughter did not pick up and voicemail box full and unable to leave a message Madisson Kulaga K. Olena Heckle NP

## 2018-03-07 NOTE — Progress Notes (Signed)
[facility] Consult Note Telephone: [phone]  Fax: [phone] 03/07/2018  PATIENT NAME: [name] [name] DOB: 1945/10/15 MRN: [id number]  PRIMARY CARE PROVIDER:   Garwin Brothers, MD  REFERRING PROVIDER:  Garwin Brothers, MD [place], [place] [address]  RESPONSIBLE PARTY:  Extended Emergency Contact Information Primary Emergency Contact: [name],[name] Address: [address]          [place],  [address] [name] of [place] Phone: [phone] Relation: Daughter Secondary Emergency Contact: Lamarr Lulas Address: unknown  [place] of [place] Phone: [phone] Relation: Son       RECOMMENDATIONS and PLAN:  1.Pain.  Pain well controlled with tylenol and tramadol.  Denies pain today.  2. Goals of care.  Remains a full code.  Patient has no new complaints today.  Voices that he only uses his PEG tube for medications and eats all his meals orally.  Patient stable and will follow up again in 2-3 months or as needed.  I spent 15 minutes providing this consultation,  from 12:15 to 12:30. More than 50% of the time in this consultation was spent coordinating communication.   HISTORY OF PRESENT ILLNESS:  [name] [name] is a 73 y.o. year old male with multiple medical problems including CVA, CHF, DMT2. Palliative Care was asked to help address goals of care.   CODE STATUS: Full Code  PPS: 40% HOSPICE ELIGIBILITY/DIAGNOSIS: TBD  PHYSICAL EXAM:  General: NAD, WNWD well-appearing, Alert /oriented x 3.  Cardiovascular: regular rate and rhythm, S1S2 Pulmonary: Lung sounds clear throughout, normal expiratory effort Abdomen: soft, nontender, + bowel sounds, denies constipation. Patient states that he is using the tube only for meds and has been getting all his calories orally Extremities: L 1+ edema, LE joint deformities Skin: no rashes, healed lesions per pt.  Neurological: Weakness, paresis,  dysarthria    PAST MEDICAL HISTORY:  Past Medical History:  Diagnosis Date  . Chronic systolic CHF (congestive heart failure) ([place])   . CVA, old, aphasia   . Diabetes ([place])   . Hemiplegia ([place])   . HTN (hypertension)   . Intestinal obstruction ([place]) 11/2015  . Peripheral vascular disease ([place])   . Pneumonia   . Stroke [facility])     SOCIAL HX:  Social History   Tobacco Use  . Smoking status: Former Research scientist (life sciences)  . Smokeless tobacco: Never Used  Substance Use Topics  . Alcohol use: No    Frequency: Never    ALLERGIES: No Known Allergies   PERTINENT MEDICATIONS:  Outpatient Encounter Medications as of 03/07/2018  Medication Sig  . acetaminophen (TYLENOL) 325 MG tablet Place 650 mg into feeding tube every 6 (six) hours as needed for mild pain.  Marland Kitchen acidophilus (RISAQUAD) CAPS capsule Take 1 capsule by mouth daily.  Marland Kitchen amiodarone (PACERONE) 200 MG tablet Place 200 mg into feeding tube daily.  Marland Kitchen aspirin 81 MG chewable tablet Place 81 mg into feeding tube daily.  Marland Kitchen atorvastatin (LIPITOR) 40 MG tablet Place 40 mg into feeding tube daily.  . carvedilol (COREG) 6.25 MG tablet Take 1 tablet (6.25 mg total) by mouth 2 (two) times daily with a meal.  . Cholecalciferol (VITAMIN D) 50 MCG (2000 UT) CAPS Take 2,000 Units by mouth daily.  . famotidine (PEPCID) 20 MG tablet Place 20 mg into feeding tube daily.  . ferrous sulfate 300 (60 Fe) MG/5ML syrup Place 300 mg into feeding tube 2 (two) times daily with a meal.  . furosemide (  LASIX) 20 MG tablet Place 2 tablets (40 mg total) into feeding tube daily.  Marland Kitchen gabapentin (NEURONTIN) 400 MG capsule Take 400 mg by mouth 2 (two) times daily.  . magnesium oxide (MAG-OX) 400 MG tablet Place 400 mg into feeding tube 2 (two) times daily.  . Menthol, Topical Analgesic, (BIOFREEZE) 4 % GEL Apply topically 2 (two) times daily. To left knee  . Multiple Vitamin (MULTIVITAMIN WITH MINERALS) TABS tablet Place 1 tablet into feeding tube daily.  . sacubitril-valsartan  (ENTRESTO) 24-26 MG Take 1 tablet by mouth 2 (two) times daily.  Marland Kitchen spironolactone (ALDACTONE) 25 MG tablet Take 0.5 tablets (12.5 mg total) by mouth daily. (Patient not taking: Reported on 01/15/2018)  . vitamin C (ASCORBIC ACID) 500 MG tablet Take 500 mg by mouth daily.   No facility-administered encounter medications on file as of 03/07/2018.     Emelia Sandoval Jenetta Downer, NP

## 2018-04-05 ENCOUNTER — Other Ambulatory Visit: Payer: Self-pay

## 2018-04-05 ENCOUNTER — Non-Acute Institutional Stay: Payer: Medicare Other | Admitting: Adult Health Nurse Practitioner

## 2018-04-05 DIAGNOSIS — Z515 Encounter for palliative care: Secondary | ICD-10-CM

## 2018-04-05 NOTE — Progress Notes (Signed)
[facility] Consult Note Telephone: [phone]  Fax: [phone]  PATIENT NAME: [name] [name] DOB: [birth year] MRN: [id number]  PRIMARY CARE PROVIDER:   Garwin Brothers, MD  REFERRING PROVIDER:  Garwin Brothers, MD [place], [place] [address]  RESPONSIBLE PARTY:  Extended Emergency Contact Information Primary Emergency Contact: [name],[name] Address: [address] [place], [place] [address] [name] of [place] Phone: [phone] Relation: Daughter Secondary Emergency Contact: Lamarr Lulas Address: unknown [place] of [place] Phone: [phone] Relation: Son       RECOMMENDATIONS and PLAN:  1. 1.Pain.  Pain well controlled with tylenol and tramadol.  Denies pain today.  2. Goals of care.  Remains a full code. No changes to advance directives today.  Patient has no new complaints today.  Voices that he only uses his PEG tube for medications and eats all his meals orally. Staff reports no concerns today. Patient stable and will follow up again in 2-3 months or as needed.  I spent 15 minutes providing this consultation,  from 1:00 to 1:15. More than 50% of the time in this consultation was spent coordinating communication.   HISTORY OF PRESENT ILLNESS:  [name] [name] is a 73 y.o. year old male with multiple medical problems including CVA, CHF, DMT2. Palliative Care was asked to help address goals of care.   CODE STATUS: Full Code  PPS: 40% HOSPICE ELIGIBILITY/DIAGNOSIS: TBD  PHYSICAL EXAM: General: NAD, WNWDwell-appearing,Alert/oriented x 3. Cardiovascular: regular rate and rhythm Pulmonary:Lung sounds clear throughout, normal expiratory effort Abdomen: soft, nontender, + bowel sounds, denies constipation.PEG tube in place and only used for meds Extremities:traceedema to lower extremities,LEjoint deformities Skin: no rashes. Neurological: Weakness, paresis, dysarthria  PAST  MEDICAL HISTORY:  Past Medical History:  Diagnosis Date  . Chronic systolic CHF (congestive heart failure) ([place])   . CVA, old, aphasia   . Diabetes ([place])   . Hemiplegia ([place])   . HTN (hypertension)   . Intestinal obstruction ([place]) 11/2015  . Peripheral vascular disease ([place])   . Pneumonia   . Stroke [facility])     SOCIAL HX:  Social History   Tobacco Use  . Smoking status: Former Research scientist (life sciences)  . Smokeless tobacco: Never Used  Substance Use Topics  . Alcohol use: No    Frequency: Never    ALLERGIES: No Known Allergies   PERTINENT MEDICATIONS:  Outpatient Encounter Medications as of 04/05/2018  Medication Sig  . acetaminophen (TYLENOL) 325 MG tablet Place 650 mg into feeding tube every 6 (six) hours as needed for mild pain.  Marland Kitchen acidophilus (RISAQUAD) CAPS capsule Take 1 capsule by mouth daily.  Marland Kitchen amiodarone (PACERONE) 200 MG tablet Place 200 mg into feeding tube daily.  Marland Kitchen aspirin 81 MG chewable tablet Place 81 mg into feeding tube daily.  Marland Kitchen atorvastatin (LIPITOR) 40 MG tablet Place 40 mg into feeding tube daily.  . carvedilol (COREG) 6.25 MG tablet Take 1 tablet (6.25 mg total) by mouth 2 (two) times daily with a meal.  . Cholecalciferol (VITAMIN D) 50 MCG (2000 UT) CAPS Take 2,000 Units by mouth daily.  . famotidine (PEPCID) 20 MG tablet Place 20 mg into feeding tube daily.  . ferrous sulfate 300 (60 Fe) MG/5ML syrup Place 300 mg into feeding tube 2 (two) times daily with a meal.  . furosemide (LASIX) 20 MG tablet Place 2 tablets (40 mg total) into feeding tube daily.  Marland Kitchen gabapentin (NEURONTIN) 400 MG capsule Take 400 mg by mouth 2 (  two) times daily.  . magnesium oxide (MAG-OX) 400 MG tablet Place 400 mg into feeding tube 2 (two) times daily.  . Menthol, Topical Analgesic, (BIOFREEZE) 4 % GEL Apply topically 2 (two) times daily. To left knee  . Multiple Vitamin (MULTIVITAMIN WITH MINERALS) TABS tablet Place 1 tablet into feeding tube daily.  . sacubitril-valsartan (ENTRESTO) 24-26 MG Take  1 tablet by mouth 2 (two) times daily.  Marland Kitchen spironolactone (ALDACTONE) 25 MG tablet Take 0.5 tablets (12.5 mg total) by mouth daily. (Patient not taking: Reported on 01/15/2018)  . vitamin C (ASCORBIC ACID) 500 MG tablet Take 500 mg by mouth daily.   No facility-administered encounter medications on file as of 04/05/2018.       Madailein Londo Jenetta Downer, NP

## 2018-07-18 ENCOUNTER — Non-Acute Institutional Stay: Payer: Medicare Other | Admitting: Adult Health Nurse Practitioner

## 2018-07-18 DIAGNOSIS — Z515 Encounter for palliative care: Secondary | ICD-10-CM

## 2018-07-19 ENCOUNTER — Other Ambulatory Visit: Payer: Self-pay

## 2018-07-19 NOTE — Progress Notes (Signed)
[facility] Consult Note Telephone: [phone]  Fax: [phone]  PATIENT NAME: [name] [name] DOB: 05-28-1945 MRN: [id number]  PRIMARY CARE PROVIDER:   Garwin Brothers, MD  REFERRING PROVIDER:  Garwin Brothers, MD [place],  [place] [address]  RESPONSIBLE PARTY:   Extended Emergency Contact Information Primary Emergency Contact: [name],[name] Address: [address] [place], [place] [address] [name] of [place] Phone: [phone] Relation: Daughter Secondary Emergency Contact: Lamarr Lulas Address: unknown [place] of [place] Phone: [phone] Relation: Son      RECOMMENDATIONS and PLAN:  1.  Pain. Pain well controlled with tylenol and tramadol. Denies pain today.  Continue current pain medication.  Though patient not very talkative he voices no new concerns  2. Goals of care. Remains a full code. No changes to advance directives today. Patient has no new complaints today. Voices that he only uses his PEG tube for medications and eats all his meals orally. Staff reports no concerns today.will follow up in 2-3 months  I spent 15 minutes providing this consultation,  from 3:00 to 3:15. More than 50% of the time in this consultation was spent coordinating communication.   HISTORY OF PRESENT ILLNESS:  [name] [name] is a 73 y.o. year old male with multiple medical problems including CVA, CHF, DMT2. Palliative Care was asked to help address goals of care.   CODE STATUS: Full code  PPS: 40% HOSPICE ELIGIBILITY/DIAGNOSIS: TBD  PHYSICAL EXAM: General: NAD, WNWDwell-appearing,Alert/oriented x 3. Cardiovascular: regular rate and rhythm Pulmonary:Lung sounds clear throughout, normal expiratory effort Abdomen: soft, nontender, + bowel sounds, denies constipation.PEG tube in place and only used for meds Extremities:traceedema to lower extremities,LEjoint deformities Skin: no  rashes. Neurological: Weakness, paresis, dysarthria  PAST MEDICAL HISTORY:  Past Medical History:  Diagnosis Date  . Chronic systolic CHF (congestive heart failure) ([place])   . CVA, old, aphasia   . Diabetes ([place])   . Hemiplegia ([place])   . HTN (hypertension)   . Intestinal obstruction ([place]) 11/2015  . Peripheral vascular disease ([place])   . Pneumonia   . Stroke [facility])     SOCIAL HX:  Social History   Tobacco Use  . Smoking status: Former Research scientist (life sciences)  . Smokeless tobacco: Never Used  Substance Use Topics  . Alcohol use: No    Frequency: Never    ALLERGIES: No Known Allergies   PERTINENT MEDICATIONS:  Outpatient Encounter Medications as of 07/18/2018  Medication Sig  . acetaminophen (TYLENOL) 325 MG tablet Place 650 mg into feeding tube every 6 (six) hours as needed for mild pain.  Marland Kitchen acidophilus (RISAQUAD) CAPS capsule Take 1 capsule by mouth daily.  Marland Kitchen amiodarone (PACERONE) 200 MG tablet Place 200 mg into feeding tube daily.  Marland Kitchen aspirin 81 MG chewable tablet Place 81 mg into feeding tube daily.  Marland Kitchen atorvastatin (LIPITOR) 40 MG tablet Place 40 mg into feeding tube daily.  . carvedilol (COREG) 6.25 MG tablet Take 1 tablet (6.25 mg total) by mouth 2 (two) times daily with a meal.  . Cholecalciferol (VITAMIN D) 50 MCG (2000 UT) CAPS Take 2,000 Units by mouth daily.  . famotidine (PEPCID) 20 MG tablet Place 20 mg into feeding tube daily.  . ferrous sulfate 300 (60 Fe) MG/5ML syrup Place 300 mg into feeding tube 2 (two) times daily with a meal.  . furosemide (LASIX) 20 MG tablet Place 2 tablets (40 mg total) into feeding tube daily.  Marland Kitchen gabapentin (NEURONTIN) 400 MG capsule Take  400 mg by mouth 2 (two) times daily.  . magnesium oxide (MAG-OX) 400 MG tablet Place 400 mg into feeding tube 2 (two) times daily.  . Menthol, Topical Analgesic, (BIOFREEZE) 4 % GEL Apply topically 2 (two) times daily. To left knee  . Multiple Vitamin (MULTIVITAMIN WITH MINERALS) TABS tablet Place 1 tablet into feeding  tube daily.  . sacubitril-valsartan (ENTRESTO) 24-26 MG Take 1 tablet by mouth 2 (two) times daily.  Marland Kitchen spironolactone (ALDACTONE) 25 MG tablet Take 0.5 tablets (12.5 mg total) by mouth daily. (Patient not taking: Reported on 01/15/2018)  . vitamin C (ASCORBIC ACID) 500 MG tablet Take 500 mg by mouth daily.   No facility-administered encounter medications on file as of 07/18/2018.     Jocilyn Trego Jenetta Downer, NP

## 2018-11-20 ENCOUNTER — Non-Acute Institutional Stay: Payer: Medicare Other | Admitting: Hospice

## 2018-11-20 ENCOUNTER — Other Ambulatory Visit: Payer: Self-pay

## 2018-11-20 DIAGNOSIS — Z515 Encounter for palliative care: Secondary | ICD-10-CM

## 2018-11-20 DIAGNOSIS — I5022 Chronic systolic (congestive) heart failure: Secondary | ICD-10-CM

## 2018-11-21 NOTE — Progress Notes (Signed)
[facility] Consult Note Telephone: [phone]  Fax: [phone]  PATIENT NAME: [name] [name] DOB: 08-30-1945 MRN: [id number]  PRIMARY CARE PROVIDER:   Garwin Brothers, MD  REFERRING PROVIDER:  Garwin Brothers, MD [address] [place] [address],  [place] [address]  RESPONSIBLE PARTY:   Self Extended Emergency Contact Information Primary Emergency Contact: [name],[name] Address: [address] [place], [place] [address] [name] of [place] Phone: [phone] Relation: Daughter Spouse Derrell Milanes - out of town, [phone]   RECOMMENDATIONS/PLAN:   Advance Care Planning/Goals of Care:  Visit consisted of building trust and discussions on Palliative Medicine as specialized medical care for people living with serious illness, aimed at facilitating advance care plan, symptoms relief and establishing goals of care. Caregiver Joaquim Lai LPN present during visit. Goals of care include to maximize quality of life and symptom management. Patient affirmed he is a DNR, copy already in Epic; no DNR in facility. Signed a copy today for facility records, informed facility Unit Manager [facility] accordingly.  Symptom management: Voiced no complaint today. Patient continuing to take Lasix via peg tube as ordered for CHF, no edema, no shortness of breath. Encouraged current regimen and compliance.  Follow up: Palliative care will continue to follow patient for goals of care clarification and symptom management  I spent   minutes providing this consultation, from  More than 50% of the time in this consultation was spent on coordinating communication  HISTORY OF PRESENT ILLNESS:  [name] [name] is a 73 y.o. year old male with multiple medical problems including CVA, CHF, DMT2. Palliative Care was asked to help address goals of care CODE STATUS: DNR  PPS: 40% HOSPICE ELIGIBILITY/DIAGNOSIS: TBD  PAST MEDICAL HISTORY:  Past Medical History:   Diagnosis Date  . Chronic systolic CHF (congestive heart failure) ([place])   . CVA, old, aphasia   . Diabetes ([place])   . Hemiplegia ([place])   . HTN (hypertension)   . Intestinal obstruction ([place]) 11/2015  . Peripheral vascular disease ([place])   . Pneumonia   . Stroke [facility])     SOCIAL HX:  Social History   Tobacco Use  . Smoking status: Former Research scientist (life sciences)  . Smokeless tobacco: Never Used  Substance Use Topics  . Alcohol use: No    Frequency: Never    ALLERGIES: No Known Allergies   PERTINENT MEDICATIONS:  Outpatient Encounter Medications as of 11/20/2018  Medication Sig  . acetaminophen (TYLENOL) 325 MG tablet Place 650 mg into feeding tube every 6 (six) hours as needed for mild pain.  Marland Kitchen acidophilus (RISAQUAD) CAPS capsule Take 1 capsule by mouth daily.  Marland Kitchen amiodarone (PACERONE) 200 MG tablet Place 200 mg into feeding tube daily.  Marland Kitchen aspirin 81 MG chewable tablet Place 81 mg into feeding tube daily.  Marland Kitchen atorvastatin (LIPITOR) 40 MG tablet Place 40 mg into feeding tube daily.  . carvedilol (COREG) 6.25 MG tablet Take 1 tablet (6.25 mg total) by mouth 2 (two) times daily with a meal.  . Cholecalciferol (VITAMIN D) 50 MCG (2000 UT) CAPS Take 2,000 Units by mouth daily.  . famotidine (PEPCID) 20 MG tablet Place 20 mg into feeding tube daily.  . ferrous sulfate 300 (60 Fe) MG/5ML syrup Place 300 mg into feeding tube 2 (two) times daily with a meal.  . furosemide (LASIX) 20 MG tablet Place 2 tablets (40 mg total) into feeding tube daily.  Marland Kitchen gabapentin (NEURONTIN) 400 MG capsule Take 400 mg by mouth 2 (two)  times daily.  . magnesium oxide (MAG-OX) 400 MG tablet Place 400 mg into feeding tube 2 (two) times daily.  . Menthol, Topical Analgesic, (BIOFREEZE) 4 % GEL Apply topically 2 (two) times daily. To left knee  . Multiple Vitamin (MULTIVITAMIN WITH MINERALS) TABS tablet Place 1 tablet into feeding tube daily.  . sacubitril-valsartan (ENTRESTO) 24-26 MG Take 1 tablet by mouth 2 (two) times daily.   Marland Kitchen spironolactone (ALDACTONE) 25 MG tablet Take 0.5 tablets (12.5 mg total) by mouth daily. (Patient not taking: Reported on 01/15/2018)  . vitamin C (ASCORBIC ACID) 500 MG tablet Take 500 mg by mouth daily.   No facility-administered encounter medications on file as of 11/20/2018.     PHYSICAL EXAM/ROS:   General: NAD,Alert/oriented x 3. Cardiovascular: regular rate and rhythm Pulmonary:Lung sounds clear throughout, normal expiratory effort Abdomen: soft, nontender, + bowel sounds, denies constipation.PEG tube in place LUQ and only used for meds Extremities: No edema Skin: no rashes on exposed skin Neurological: Weakness, otherwise non focal  Teodoro Spray, NP

## 2019-01-09 ENCOUNTER — Other Ambulatory Visit: Payer: Self-pay

## 2019-01-09 ENCOUNTER — Emergency Department (HOSPITAL_COMMUNITY)
Admission: EM | Admit: 2019-01-09 | Discharge: 2019-01-10 | Disposition: A | Discharge status: Other Acute Inpatient Hospital | Payer: Medicare Other | Attending: Emergency Medicine | Admitting: Emergency Medicine

## 2019-01-09 ENCOUNTER — Encounter (HOSPITAL_COMMUNITY): Payer: Self-pay | Admitting: Emergency Medicine

## 2019-01-09 ENCOUNTER — Emergency Department (HOSPITAL_COMMUNITY): Payer: Medicare Other

## 2019-01-09 DIAGNOSIS — I4891 Unspecified atrial fibrillation: Secondary | ICD-10-CM | POA: Insufficient documentation

## 2019-01-09 DIAGNOSIS — I110 Hypertensive heart disease with heart failure: Secondary | ICD-10-CM | POA: Diagnosis not present

## 2019-01-09 DIAGNOSIS — K9423 Gastrostomy malfunction: Secondary | ICD-10-CM

## 2019-01-09 DIAGNOSIS — Z931 Gastrostomy status: Secondary | ICD-10-CM

## 2019-01-09 DIAGNOSIS — Z7982 Long term (current) use of aspirin: Secondary | ICD-10-CM | POA: Diagnosis not present

## 2019-01-09 DIAGNOSIS — I509 Heart failure, unspecified: Secondary | ICD-10-CM | POA: Insufficient documentation

## 2019-01-09 DIAGNOSIS — Z79899 Other long term (current) drug therapy: Secondary | ICD-10-CM | POA: Diagnosis not present

## 2019-01-09 DIAGNOSIS — E119 Type 2 diabetes mellitus without complications: Secondary | ICD-10-CM | POA: Diagnosis not present

## 2019-01-09 DIAGNOSIS — I739 Peripheral vascular disease, unspecified: Secondary | ICD-10-CM | POA: Insufficient documentation

## 2019-01-09 MED ORDER — CEPHALEXIN 500 MG PO CAPS: 500.0000 mg | ORAL_CAPSULE | Freq: Two times a day (BID) | ORAL | 0 refills | Status: DC

## 2019-01-09 NOTE — ED Triage Notes (Signed)
Pt arrives via EMS from [facility] with reports of PEG tube being cut, not displaced. Pt doesn't use PEG tube but wife does not want it removed. Denies any complaints

## 2019-01-09 NOTE — ED Notes (Signed)
Pt to xray

## 2019-01-09 NOTE — Discharge Instructions (Addendum)
G tube was replaced here with 20 fr/7-10 cc balloon  There was some redness and discharge from skin, so antibiotics have been ordered. Monitor for worsening signs of infection.   Follow up with IR or primary care team for re-evaluation of G tube to ensure no other complications arise after exchange  Return for fever, abdominal pain other concerns

## 2019-01-09 NOTE — ED Provider Notes (Signed)
PEG tube in appropriate position on x-ray.  No evidence of air or contrast leak.  Patient appears stable to return facility.  BP (!) 163/80 (BP Location: Left Arm)   Pulse 60   Temp 97.8 F (36.6 C) (Oral)   Resp 16   SpO2 96%     Ezequiel Essex, MD 01/09/19 [phone]

## 2019-01-09 NOTE — ED Provider Notes (Signed)
[facility] EMERGENCY DEPARTMENT Provider Note   CSN: [id number] Arrival date & time: 01/09/19  1321     History Chief Complaint  Patient presents with  . peg tube problem    Vernell Townley is a 73 y.o. male with history of CVA, heart failure, diabetes, on palliative care, G-tube presents to ER from [facility] for report of PEG tube malfunction.  Somehow PEG tube was cut.  This was noticed this morning.  Per triage note patient does not use PEG tube but wife does not want PEG tube removed.  Per chart review, patient is still receiving some medicines through PEG tube but transitioning to oral intake.  Has been seen in the ER multiple times for exchange.  Last exchange on 01/2018.  Patient otherwise denies any other complaints. HPI     Past Medical History:  Diagnosis Date  . Chronic systolic CHF (congestive heart failure) ([place])   . CVA, old, aphasia   . Diabetes ([place])   . Hemiplegia ([place])   . HTN (hypertension)   . Intestinal obstruction ([place]) 11/2015  . Peripheral vascular disease ([place])   . Pneumonia   . Stroke [facility])     Patient Active Problem List   Diagnosis Date Noted  . Sepsis ([place])   . At risk for aspiration   . Palliative care by specialist   . Goals of care, counseling/discussion   . Acute respiratory failure with hypoxia ([place]) 03/22/2017  . CVA, old, aphasia 03/22/2017  . Acute on chronic systolic CHF (congestive heart failure) ([place]) 03/22/2017  . Mild renal insufficiency 03/22/2017  . AF (paroxysmal atrial fibrillation) ([place]) 03/22/2017    Past Surgical History:  Procedure Laterality Date  . ABDOMINAL ADHESION SURGERY  11/2015  . PERIPHERAL VASCULAR CATHETERIZATION     R-EIA stent, possibly R-Subclavian stent 2017       Family History  Problem Relation Age of Onset  . CAD Neg Hx     Social History   Tobacco Use  . Smoking status: Former Research scientist (life sciences)  . Smokeless tobacco: Never Used  Substance Use Topics  . Alcohol use: No   . Drug use: No    Home Medications Prior to Admission medications   Medication Sig Start Date End Date Taking? Authorizing Provider  acetaminophen (TYLENOL) 325 MG tablet Place 650 mg into feeding tube every 6 (six) hours as needed for mild pain.    [provider]  acidophilus (RISAQUAD) CAPS capsule Take 1 capsule by mouth daily.    [provider]  amiodarone (PACERONE) 200 MG tablet Place 200 mg into feeding tube daily.    [provider]  aspirin 81 MG chewable tablet Place 81 mg into feeding tube daily.    [provider]  atorvastatin (LIPITOR) 40 MG tablet Place 40 mg into feeding tube daily.    [provider]  carvedilol (COREG) 6.25 MG tablet Take 1 tablet (6.25 mg total) by mouth 2 (two) times daily with a meal. 03/28/17   Regalado, Belkys A, MD  Cholecalciferol (VITAMIN D) 50 MCG (2000 UT) CAPS Take 2,000 Units by mouth daily.    [provider]  famotidine (PEPCID) 20 MG tablet Place 20 mg into feeding tube daily.    [provider]  ferrous sulfate 300 (60 Fe) MG/5ML syrup Place 300 mg into feeding tube 2 (two) times daily with a meal.    [provider]  furosemide (LASIX) 20 MG tablet Place 2 tablets (40 mg total) into  feeding tube daily. 03/28/17   Regalado, Belkys A, MD  gabapentin (NEURONTIN) 400 MG capsule Take 400 mg by mouth 2 (two) times daily.    [provider]  magnesium oxide (MAG-OX) 400 MG tablet Place 400 mg into feeding tube 2 (two) times daily.    [provider]  Menthol, Topical Analgesic, (BIOFREEZE) 4 % GEL Apply topically 2 (two) times daily. To left knee    [provider]  Multiple Vitamin (MULTIVITAMIN WITH MINERALS) TABS tablet Place 1 tablet into feeding tube daily.    [provider]  sacubitril-valsartan (ENTRESTO) 24-26 MG Take 1 tablet by mouth 2 (two) times daily. 03/28/17   Regalado, Belkys A, MD  spironolactone (ALDACTONE) 25 MG tablet Take  0.5 tablets (12.5 mg total) by mouth daily. Patient not taking: Reported on 01/15/2018 03/28/17   Regalado, Jerald Kief A, MD  vitamin C (ASCORBIC ACID) 500 MG tablet Take 500 mg by mouth daily.    [provider]    Allergies    Patient has no known allergies.  Review of Systems   Review of Systems  All other systems reviewed and are negative.   Physical Exam Updated Vital Signs BP (!) 163/80 (BP Location: Left Arm)   Pulse 60   Temp 97.8 F (36.6 C) (Oral)   Resp 16   SpO2 96%   Physical Exam Vitals and nursing note reviewed.  Constitutional:      General: He is not in acute distress.    Appearance: He is well-developed.     Comments: NAD.  HENT:     Head: Normocephalic and atraumatic.     Right Ear: External ear normal.     Left Ear: External ear normal.     Nose: Nose normal.  Eyes:     General: No scleral icterus.    Conjunctiva/sclera: Conjunctivae normal.  Cardiovascular:     Rate and Rhythm: Normal rate and regular rhythm.     Heart sounds: Normal heart sounds.  Pulmonary:     Effort: Pulmonary effort is normal.     Breath sounds: Normal breath sounds.  Abdominal:     Palpations: Abdomen is soft.     Tenderness: There is no abdominal tenderness.     Comments: PEG tube in left mid abdomen, proximal area of tube has been cut.  Skin around PEG tube has moderate amount of breakdown, moist and mild amount of purulent malodorous discharge. No tenderness, fluctuance, induration however.  No abd tenderness.   Musculoskeletal:        General: Normal range of motion.     Cervical back: Normal range of motion and neck supple.  Skin:    General: Skin is warm and dry.     Capillary Refill: Capillary refill takes less than 2 seconds.  Neurological:     Mental Status: He is alert and oriented to person, place, and time.  Psychiatric:        Thought Content: Thought content normal.     ED Results / Procedures / Treatments   Labs (all labs ordered are listed, but  only abnormal results are displayed) Labs Reviewed - No data to display  EKG None  Radiology DG ABDOMEN PEG TUBE LOCATION  Result Date: 01/09/2019 CLINICAL DATA:  Evaluate PEG placement EXAM: ABDOMEN - 1 VIEW COMPARISON:  01/15/2018 abdominal radiograph FINDINGS: 50 cc Omnipaque oral contrast administered through the PEG tube at 16:30. Percutaneous gastrostomy tube with distended balloon terminates over the proximal body of the stomach in  the left upper abdomen. Water-soluble contrast fills the nondistended stomach and transits through the duodenum to the level of the duodenal jejunal junction. No evidence of extraluminal contrast. No evidence of free intraperitoneal air. Vascular stents overlie the bilateral iliac vessels. IVC filter in the medial right abdomen. No dilated small bowel loops. Moderate colonic stool volume. IMPRESSION: Percutaneous gastrostomy tube with distended balloon terminates over the proximal body of the stomach in the left upper abdomen. Stomach is nondistended. Water-soluble contrast seen in the lumen of the stomach and duodenum with no evidence of extraluminal contrast and no evidence of free air. Nonobstructive bowel gas pattern. Moderate colonic stool volume may indicate constipation. Electronically Signed   By: Ilona Sorrel M.D.   On: 01/09/2019 16:40    Procedures Gastrostomy tube replacement  Date/Time: 01/09/2019 4:47 PM Performed by: Kinnie Feil, PA-C Authorized by: Kinnie Feil, PA-C  Consent: Verbal consent obtained. Written consent obtained. Risks and benefits: risks, benefits and alternatives were discussed Consent given by: patient and spouse Patient understanding: patient states understanding of the procedure being performed Patient consent: the patient's understanding of the procedure matches consent given Procedure consent: procedure consent matches procedure scheduled Relevant documents: relevant documents present and verified Test  results: test results available and properly labeled Required items: required blood products, implants, devices, and special equipment available Patient identity confirmed: verbally with patient and arm band Time out: Immediately prior to procedure a "time out" was called to verify the correct patient, procedure, equipment, support staff and site/side marked as required. Preparation: Patient was prepped and draped in the usual sterile fashion. Local anesthesia used: no  Anesthesia: Local anesthesia used: no  Sedation: Patient sedated: no  Patient tolerance: patient tolerated the procedure well with no immediate complications Comments: EDP Rancour at bedside     (including critical care time)  Medications Ordered in ED Medications - No data to display  ED Course  I have reviewed the triage vital signs and the nursing notes.  Pertinent labs & imaging results that were available during my care of the patient were reviewed by me and considered in my medical decision making (see chart for details).  Clinical Course as of Jan 09 1647  Wed Jan 09, 2019  [id number] materials  8095 OR    [CG]    Clinical Course User Index [CG] Arlean Hopping   MDM Rules/Calculators/A&P                      I contacted wife regarding patient's PEG tube.  Initially patient did not want replacement of tube but when it completely removed.  Wife is requesting PEG tube be replaced.  She personally spoke to patient and patient eventually agreed to have it exchanged.  Per chart review, patient is transitioning to oral intake but still getting Lasix via his PEG tube and other medicines.  G-tube replaced here with G-tube 20 French/10 cc balloon without immediate complications.  Scant amount of purulent malodorous drainage noted with moderate amount of likely chronic skin breakdown.  He is afebrile and has benign abdominal exam.  Low threshold to treat with antibiotics.  Patient handed off to EDP  who will follow-up on confirmatory KUB and discharged with antibiotics.  Wife updated regarding g tube exchange recommending IR f/u as needed and monitoring of skin/infection   Final Clinical Impression(s) / ED Diagnoses Final diagnoses:  G tube feedings ([place])  Malfunction of gastrostomy tube ([place])    Rx /  DC Orders ED Discharge Orders    None       Arlean Hopping 01/09/19 1648    Ezequiel Essex, MD 01/09/19 [phone]

## 2019-01-10 NOTE — ED Notes (Addendum)
Attempted to call report three times to [facility]. Was put on hold by a lady who answered the phone n the [facility]. Was for a total of 30 minutes. PTAR left with patient.

## 2019-03-04 ENCOUNTER — Other Ambulatory Visit: Payer: Self-pay

## 2019-03-04 ENCOUNTER — Ambulatory Visit (INDEPENDENT_AMBULATORY_CARE_PROVIDER_SITE_OTHER): Payer: Medicare Other | Admitting: Physician Assistant

## 2019-03-04 ENCOUNTER — Ambulatory Visit: Payer: Self-pay

## 2019-03-04 ENCOUNTER — Encounter: Payer: Self-pay | Admitting: Physician Assistant

## 2019-03-04 DIAGNOSIS — M25552 Pain in left hip: Secondary | ICD-10-CM | POA: Diagnosis not present

## 2019-03-04 DIAGNOSIS — S72432A Displaced fracture of medial condyle of left femur, initial encounter for closed fracture: Secondary | ICD-10-CM

## 2019-03-04 DIAGNOSIS — S72422A Displaced fracture of lateral condyle of left femur, initial encounter for closed fracture: Secondary | ICD-10-CM

## 2019-03-04 NOTE — Progress Notes (Signed)
Office Visit Note   Patient: [name] [name]           Date of Birth: 06-Apr-1945           MRN: [id number] Visit Date: 03/04/2019              Requested by: No referring provider defined for this encounter. PCP: Patient, No Pcp Per   Assessment & Plan: Visit Diagnoses:  1. Pain in left hip   2. Closed bicondylar fracture of distal femur, left, initial encounter ([place])     Plan: Recommend no surgical intervention.  Remain nonweightbearing left lower extremity.  We will see him back in 4 weeks for repeat radiographs most likely at that time would start physical therapy for range of motion.  Questions were encouraged and answered patient.  Follow-Up Instructions: Return in about 4 weeks (around 04/01/2019) for Radiographs.   Orders:  Orders Placed This Encounter  Procedures  . XR FEMUR MIN 2 VIEWS LEFT   No orders of the defined types were placed in this encounter.     Procedures: No procedures performed   Clinical Data: No additional findings.   Subjective: Chief Complaint  Patient presents with  . Left Hip - Pain    femur    HPI [name] [name] is a 74 year old male who were seen for the first time for a left femur fracture.  He reports that he was dropped by a CNA on New Year's Eve at [facility] injured his left leg.  Having 8 out of 10 pain.  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  He also has history of congestive heart failure and diabetes and is on palliative care.   Review of Systems Negative for fevers, chills shortness of breath.   Objective: Vital Signs: There were no vitals taken for this visit.  Physical Exam General: Well-developed no acute distress Ortho Exam Left leg: Minimal internal rotation.  Knee is contracted.  He has tenderness over the left knee.  No tenderness over the midshaft of the femur or the hip. Specialty Comments:  No specialty comments  available.  Imaging: XR FEMUR MIN 2 VIEWS LEFT  Result Date: 03/04/2019 Left femur 2 views: Patient status post IM nailing of the left hip fracture.  There is significant heterotopic bone about the left hip.  End-stage arthritis left hip.  Hips well located.  No acute fractures.  Left knee bicondylar fracture with slight shortening and displacement.  There is early signs of consolidation.  Disuse osteopenia.    PMFS History: Patient Active Problem List   Diagnosis Date Noted  . Sepsis ([place])   . At risk for aspiration   . Palliative care by specialist   . Goals of care, counseling/discussion   . Acute respiratory failure with hypoxia ([place]) 03/22/2017  . CVA, old, aphasia 03/22/2017  . Acute on chronic systolic CHF (congestive heart failure) ([place]) 03/22/2017  . Mild renal insufficiency 03/22/2017  . AF (paroxysmal atrial fibrillation) ([place]) 03/22/2017   Past Medical History:  Diagnosis Date  . Chronic systolic CHF (congestive heart failure) ([place])   . CVA, old, aphasia   . Diabetes ([place])   . Hemiplegia ([place])   . HTN (hypertension)   . Intestinal obstruction ([place]) 11/2015  . Peripheral vascular disease ([place])   . Pneumonia   . Stroke [facility])     Family History  Problem Relation Age of Onset  .  CAD Neg Hx     Past Surgical History:  Procedure Laterality Date  . ABDOMINAL ADHESION SURGERY  11/2015  . PERIPHERAL VASCULAR CATHETERIZATION     R-EIA stent, possibly R-Subclavian stent 2017   Social History   Occupational History  . Occupation: Retired  Tobacco Use  . Smoking status: Former Research scientist (life sciences)  . Smokeless tobacco: Never Used  Substance and Sexual Activity  . Alcohol use: No  . Drug use: No  . Sexual activity: Never

## 2019-04-12 ENCOUNTER — Other Ambulatory Visit: Payer: Self-pay

## 2019-04-12 ENCOUNTER — Non-Acute Institutional Stay: Payer: Medicare Other | Admitting: Hospice

## 2019-04-12 DIAGNOSIS — Z515 Encounter for palliative care: Secondary | ICD-10-CM

## 2019-04-12 DIAGNOSIS — I5022 Chronic systolic (congestive) heart failure: Secondary | ICD-10-CM

## 2019-04-12 NOTE — Progress Notes (Signed)
[facility] Consult Note Telephone: [phone]  Fax: [phone]  PATIENT NAME: [name] [name] DOB: Jun 15, 1945 MRN: [id number]  PRIMARY CARE PROVIDER:   Garwin Brothers, MD  REFERRING PROVIDER:  Garwin Brothers, MD [address] [place],  [place] [address]  RESPONSIBLE PARTY:   Self Extended Emergency Contact Information Primary Emergency Contact: [name],[name] Address: [address] [place],  [address] [name] of [place] Phone: [phone] Relation: Daughter Spouse Jamarquis Crull - out of town, [phone]   RECOMMENDATIONS/PLAN:   Advance Care Planning/Goals of Care:  Visit consisted of building trust and follow up with palliative care. Further discussions on Palliative Medicine as specialized medical care for people living with serious illness, aimed at facilitating advance care plan, symptoms relief and establishing goals of care. Facility EMR indicates patient is a Full code. Patient had elected DNR status  While this NP visiting him with Caregiver Joaquim Lai LPN [id number].  Unit Manager Beverlee Nims was informed accordingly. This NP confirmed signed DNR form in the facility code status book. Patient affirmed today he is a DNR. Goals of care include to maximize quality of life and symptom management. DNR copy already in Epic. 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 Therapeutic listening and validation provided. Spoke with Waynette Buttery NP and she said she will reflect the correct code status in the EMR.  Symptom management: Patient voiced no complaint today. Patient continuing to take Lasix via peg tube as ordered for CHF, 1+ edema to left foot- left foot hanging down on a pillow, no shortness of breath. Discussed edema with nursing staff which she said is chronic; encouraged proper elevation and to give Lasix as ordered and call if no  improvement. Patient is non ambulatory, max assist for ADLs. Nursing -Nira Conn- reports he continues with Glucerna via peg tube as ordered and that patient refuses tramadol and takes his medications by mouth, whole not crushed. Aspiration precautions in place. Patient denied pain/discomfort, in no acute distress. Encouraged ongoing nursing care Follow up: Palliative care will continue to follow patient for goals of care clarification and symptom management  I spent one hour and 16 minutes providing this consultation, time includes chart review and documentation. More than 50% of the time in this consultation was spent on coordinating communication  HISTORY OF PRESENT ILLNESS:[name] Jamesis a 74 y.o.year oldmalewith multiple medical problems including CVA, Dysphagia, CHF, DMT2. Palliative Care was asked to help address goals of care CODE STATUS: DNR  PPS: 40%  CODE STATUS: DNR HOSPICE ELIGIBILITY/DIAGNOSIS: TBD  PAST MEDICAL HISTORY:  Past Medical History:  Diagnosis Date  . Chronic systolic CHF (congestive heart failure) ([place])   . CVA, old, aphasia   . Diabetes ([place])   . Hemiplegia ([place])   . HTN (hypertension)   . Intestinal obstruction ([place]) 11/2015  . Peripheral vascular disease ([place])   . Pneumonia   . Stroke [facility])     SOCIAL HX:  Social History   Tobacco Use  . Smoking status: Former Research scientist (life sciences)  . Smokeless tobacco: Never Used  Substance Use Topics  . Alcohol use: No    ALLERGIES: No Known Allergies   PERTINENT MEDICATIONS:  Outpatient Encounter Medications as of 04/12/2019  Medication Sig  . acetaminophen (TYLENOL) 325 MG tablet Place 650 mg into feeding tube every 6 (six) hours as needed for mild pain.  [place] Kitchen acidophilus (RISAQUAD) CAPS capsule Take 1 capsule  by mouth daily.  Marland Kitchen amiodarone (PACERONE) 200 MG tablet Place 200 mg into feeding tube daily.  Marland Kitchen aspirin 81 MG chewable tablet Place 81 mg into feeding tube daily.  Marland Kitchen atorvastatin (LIPITOR) 40 MG tablet Place 40  mg into feeding tube daily.  . carvedilol (COREG) 6.25 MG tablet Take 1 tablet (6.25 mg total) by mouth 2 (two) times daily with a meal.  . cephALEXin (KEFLEX) 500 MG capsule Place 1 capsule (500 mg total) into feeding tube 2 (two) times daily.  . Cholecalciferol (VITAMIN D) 50 MCG (2000 UT) CAPS Take 2,000 Units by mouth daily.  . famotidine (PEPCID) 20 MG tablet Place 20 mg into feeding tube daily.  . ferrous sulfate 300 (60 Fe) MG/5ML syrup Place 300 mg into feeding tube 2 (two) times daily with a meal.  . furosemide (LASIX) 20 MG tablet Place 2 tablets (40 mg total) into feeding tube daily.  Marland Kitchen gabapentin (NEURONTIN) 400 MG capsule Take 400 mg by mouth 2 (two) times daily.  . magnesium oxide (MAG-OX) 400 MG tablet Place 400 mg into feeding tube 2 (two) times daily.  . Menthol, Topical Analgesic, (BIOFREEZE) 4 % GEL Apply topically 2 (two) times daily. To left knee  . Multiple Vitamin (MULTIVITAMIN WITH MINERALS) TABS tablet Place 1 tablet into feeding tube daily.  . sacubitril-valsartan (ENTRESTO) 24-26 MG Take 1 tablet by mouth 2 (two) times daily.  Marland Kitchen spironolactone (ALDACTONE) 25 MG tablet Take 0.5 tablets (12.5 mg total) by mouth daily. (Patient not taking: Reported on 01/15/2018)  . vitamin C (ASCORBIC ACID) 500 MG tablet Take 500 mg by mouth daily.   No facility-administered encounter medications on file as of 04/12/2019.    PHYSICAL EXAM/ROS:   General: NAD,cooperative Cardiovascular: regular rate and rhythm Pulmonary: clear ant fields, normal respiratory effort Abdomen: soft, nontender, + bowel sounds GU: no suprapubic tenderness; functional peg tube, dressing clean dry and intact. Extremities:1+ pitting edema to left foot, mycotic toenails in right foot.  Skin: no rashes to exposed skin Neurological: Weakness but otherwise nonfocal  Teodoro Spray, NP

## 2019-04-29 ENCOUNTER — Ambulatory Visit (INDEPENDENT_AMBULATORY_CARE_PROVIDER_SITE_OTHER): Payer: Medicare Other

## 2019-04-29 ENCOUNTER — Encounter: Payer: Self-pay | Admitting: Physician Assistant

## 2019-04-29 ENCOUNTER — Other Ambulatory Visit: Payer: Self-pay

## 2019-04-29 ENCOUNTER — Ambulatory Visit (INDEPENDENT_AMBULATORY_CARE_PROVIDER_SITE_OTHER): Payer: Medicare Other | Admitting: Physician Assistant

## 2019-04-29 DIAGNOSIS — S72432A Displaced fracture of medial condyle of left femur, initial encounter for closed fracture: Secondary | ICD-10-CM

## 2019-04-29 DIAGNOSIS — S72422A Displaced fracture of lateral condyle of left femur, initial encounter for closed fracture: Secondary | ICD-10-CM | POA: Diagnosis not present

## 2019-04-29 NOTE — Progress Notes (Signed)
HPI: [name] [name] returns today follow-up of his left distal femur fracture.  He states overall that his pain is becoming less and less.  Again he was dropped by a CNA on New Year's Eve at [facility] and injured his left knee.  He is hemiplegic due to stroke that affected his left side some 10 years ago.  He presents today on a stretcher.  Physical exam: Left distal femur able to extend the knee with approximately 45 degree flexion contracture.  Has no tenderness over the midshaft femur.  Minimal tenderness over the left knee.  No impending rashes skin lesions ulcerations.  Radiographs:Lateral view of the left femur: Consolidation of the distal femur fracture.  Unable to obtain AP view due to patient unable to tolerate.  Left hip appears well located.  Status post IM nailing left hip fracture.  Heterotopic bone about the left hip.  Impression: Left distal femur closed bicondylar fracture  Plan: Recommend gentle range of motion with physical therapy also strengthening the leg is tolerated.  Follow-up in 4 weeks for repeat radiographs.  Questions were encouraged and answered.

## 2019-05-27 ENCOUNTER — Ambulatory Visit: Payer: Medicare Other | Admitting: Physician Assistant

## 2019-06-11 ENCOUNTER — Non-Acute Institutional Stay: Payer: Medicare Other | Admitting: Hospice

## 2019-06-11 ENCOUNTER — Other Ambulatory Visit: Payer: Self-pay

## 2019-06-11 DIAGNOSIS — Z515 Encounter for palliative care: Secondary | ICD-10-CM

## 2019-06-11 NOTE — Progress Notes (Signed)
[facility] Palliative Care Consult Note Telephone: [phone]  Fax: [phone]  PATIENT NAME: [name] [name] DOB: 09/09/1945 MRN: [id number]  PRIMARY CARE PROVIDER:Amin, Marlene Lard, MD  Gilbert, Marlene Lard, MD [place], [place] [address]  RESPONSIBLE PARTY:Self Extended Emergency Contact Information Primary Emergency Contact: [name],[name] Address: [address] [facility] [place], [place] [address] [name] of [place] Phone: [phone] Relation: Daughter Spouse Ishmel Acevedo - out of town, [phone]   RECOMMENDATIONS/PLAN:  [place] Planning/Goals of Care: Visit consisted of building trust and follow up with palliative care. Further discussions on Palliative Medicine as specialized medical care for people living with serious illness, aimed at facilitating advance care plan, symptoms relief and establishing goals of care.  Patient affirmed today he is a DNR. Goals of care include to maximize quality of life and symptom management.DNR copy in Epic. 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 Therapeutic listening and validation provided.  Visit consisted of counseling and education dealing with the complex and emotionally intense issues of symptom management and palliative care in the setting of serious and potentially life-threatening illness.  Patient is open to further discussions on goals of care clarification, and hospice services in the future, and subsequent visits.  Palliative care team will continue to support patient, patient's family, and medical team. Symptom management:Patient voiced no complaint today; denied pain/discomfort, in no acute distress. Patient continuing to take Lasix via peg tube as ordered for CHF,  no coughing, no edema in bilateral extremities, no shortness of breath.mPatient is non ambulatory,  max assist for ADLs. Nursing -Nira Conn- reports he continues with Glucerna via peg tube as ordered and that patient refuses tramadol and takes his medications by mouth, whole not crushed. Aspiration precautions in place.  Nursing with no report of medical acuity.  Encouraged ongoing nursing care Follow [id number] care will continue to follow patient for goals of care clarification and symptom management I spent  46 minutes providing this consultation, time includes chart review and documentation.More than 50% of the time in this consultation was spent on coordinating communication  HISTORY OF PRESENT ILLNESS:[name] Jamesis a 74 y.o.year oldmalewith multiple medical problems including CVA, Dysphagia, CHF, DMT2. Palliative Care was asked to help address goals of care CODE STATUS:DNR  PPS:40%  CODE STATUS: DNR HOSPICE ELIGIBILITY/DIAGNOSIS: TBD  PAST MEDICAL HISTORY:  Past Medical History:  Diagnosis Date  . Chronic systolic CHF (congestive heart failure) ([place])   . CVA, old, aphasia   . Diabetes ([place])   . Hemiplegia ([place])   . HTN (hypertension)   . Intestinal obstruction ([place]) 11/2015  . Peripheral vascular disease ([place])   . Pneumonia   . Stroke [facility])     SOCIAL HX:  Social History   Tobacco Use  . Smoking status: Former Research scientist (life sciences)  . Smokeless tobacco: Never Used  Substance Use Topics  . Alcohol use: No    ALLERGIES: No Known Allergies   PERTINENT MEDICATIONS:  Outpatient Encounter Medications as of 06/11/2019  Medication Sig  . acetaminophen (TYLENOL) 325 MG tablet Place 650 mg into feeding tube every 6 (six) hours as needed for mild pain.  Marland Kitchen acidophilus (RISAQUAD) CAPS capsule Take 1 capsule by mouth daily.  Marland Kitchen amiodarone (PACERONE) 200 MG tablet Place 200 mg into feeding tube daily.  Marland Kitchen aspirin 81 MG chewable tablet Place 81 mg into feeding tube daily.  Marland Kitchen atorvastatin (LIPITOR) 40 MG tablet  Place 40 mg into feeding tube daily.  . carvedilol (COREG) 6.25 MG  tablet Take 1 tablet (6.25 mg total) by mouth 2 (two) times daily with a meal.  . cephALEXin (KEFLEX) 500 MG capsule Place 1 capsule (500 mg total) into feeding tube 2 (two) times daily.  . Cholecalciferol (VITAMIN D) 50 MCG (2000 UT) CAPS Take 2,000 Units by mouth daily.  . famotidine (PEPCID) 20 MG tablet Place 20 mg into feeding tube daily.  . ferrous sulfate 300 (60 Fe) MG/5ML syrup Place 300 mg into feeding tube 2 (two) times daily with a meal.  . furosemide (LASIX) 20 MG tablet Place 2 tablets (40 mg total) into feeding tube daily.  Marland Kitchen gabapentin (NEURONTIN) 400 MG capsule Take 400 mg by mouth 2 (two) times daily.  . magnesium oxide (MAG-OX) 400 MG tablet Place 400 mg into feeding tube 2 (two) times daily.  . Menthol, Topical Analgesic, (BIOFREEZE) 4 % GEL Apply topically 2 (two) times daily. To left knee  . Multiple Vitamin (MULTIVITAMIN WITH MINERALS) TABS tablet Place 1 tablet into feeding tube daily.  . sacubitril-valsartan (ENTRESTO) 24-26 MG Take 1 tablet by mouth 2 (two) times daily.  Marland Kitchen spironolactone (ALDACTONE) 25 MG tablet Take 0.5 tablets (12.5 mg total) by mouth daily.  . vitamin C (ASCORBIC ACID) 500 MG tablet Take 500 mg by mouth daily.   No facility-administered encounter medications on file as of 06/11/2019.   PHYSICAL EXAM/ROS:   General: NAD,cooperative Cardiovascular: regular rate and rhythm; denies chest pain Pulmonary: clear ant fields, normal respiratory effort Abdomen: soft, nontender, + bowel sounds GU: no suprapubic tenderness; functional peg tube, dressing clean dry and intact. Extremities: No edema, mycotic toenails in right foot.  Skin: no rashes to exposed skin Neurological: Weakness but otherwise nonfocal  Teodoro Spray, NP

## 2019-09-28 ENCOUNTER — Emergency Department (HOSPITAL_COMMUNITY): Payer: Medicare Other

## 2019-09-28 ENCOUNTER — Encounter (HOSPITAL_COMMUNITY): Payer: Self-pay

## 2019-09-28 ENCOUNTER — Other Ambulatory Visit: Payer: Self-pay

## 2019-09-28 ENCOUNTER — Inpatient Hospital Stay (HOSPITAL_COMMUNITY)
Admission: EM | Admit: 2019-09-28 | Discharge: 2019-10-02 | DRG: 393 | Disposition: A | Discharge status: Skilled Nursing Facility | Payer: Medicare Other | Source: Skilled Nursing Facility | Attending: Internal Medicine | Admitting: Internal Medicine

## 2019-09-28 DIAGNOSIS — J189 Pneumonia, unspecified organism: Secondary | ICD-10-CM

## 2019-09-28 DIAGNOSIS — N289 Disorder of kidney and ureter, unspecified: Secondary | ICD-10-CM | POA: Diagnosis present

## 2019-09-28 DIAGNOSIS — L89021 Pressure ulcer of left elbow, stage 1: Secondary | ICD-10-CM | POA: Diagnosis present

## 2019-09-28 DIAGNOSIS — I5022 Chronic systolic (congestive) heart failure: Secondary | ICD-10-CM | POA: Diagnosis present

## 2019-09-28 DIAGNOSIS — L89893 Pressure ulcer of other site, stage 3: Secondary | ICD-10-CM | POA: Diagnosis present

## 2019-09-28 DIAGNOSIS — I482 Chronic atrial fibrillation, unspecified: Secondary | ICD-10-CM | POA: Diagnosis present

## 2019-09-28 DIAGNOSIS — R1310 Dysphagia, unspecified: Secondary | ICD-10-CM | POA: Diagnosis present

## 2019-09-28 DIAGNOSIS — K5900 Constipation, unspecified: Secondary | ICD-10-CM | POA: Diagnosis present

## 2019-09-28 DIAGNOSIS — Z87891 Personal history of nicotine dependence: Secondary | ICD-10-CM

## 2019-09-28 DIAGNOSIS — R001 Bradycardia, unspecified: Secondary | ICD-10-CM | POA: Diagnosis present

## 2019-09-28 DIAGNOSIS — J690 Pneumonitis due to inhalation of food and vomit: Secondary | ICD-10-CM | POA: Diagnosis present

## 2019-09-28 DIAGNOSIS — I130 Hypertensive heart and chronic kidney disease with heart failure and stage 1 through stage 4 chronic kidney disease, or unspecified chronic kidney disease: Secondary | ICD-10-CM | POA: Diagnosis present

## 2019-09-28 DIAGNOSIS — Z66 Do not resuscitate: Secondary | ICD-10-CM | POA: Diagnosis present

## 2019-09-28 DIAGNOSIS — N1831 Chronic kidney disease, stage 3a: Secondary | ICD-10-CM | POA: Diagnosis present

## 2019-09-28 DIAGNOSIS — E1122 Type 2 diabetes mellitus with diabetic chronic kidney disease: Secondary | ICD-10-CM | POA: Diagnosis present

## 2019-09-28 DIAGNOSIS — L89151 Pressure ulcer of sacral region, stage 1: Secondary | ICD-10-CM | POA: Diagnosis present

## 2019-09-28 DIAGNOSIS — I69320 Aphasia following cerebral infarction: Secondary | ICD-10-CM

## 2019-09-28 DIAGNOSIS — Z7982 Long term (current) use of aspirin: Secondary | ICD-10-CM

## 2019-09-28 DIAGNOSIS — L0390 Cellulitis, unspecified: Secondary | ICD-10-CM | POA: Diagnosis present

## 2019-09-28 DIAGNOSIS — I69354 Hemiplegia and hemiparesis following cerebral infarction affecting left non-dominant side: Secondary | ICD-10-CM | POA: Diagnosis not present

## 2019-09-28 DIAGNOSIS — E870 Hyperosmolality and hypernatremia: Secondary | ICD-10-CM | POA: Diagnosis not present

## 2019-09-28 DIAGNOSIS — K9423 Gastrostomy malfunction: Principal | ICD-10-CM

## 2019-09-28 DIAGNOSIS — K56609 Unspecified intestinal obstruction, unspecified as to partial versus complete obstruction: Secondary | ICD-10-CM | POA: Diagnosis present

## 2019-09-28 DIAGNOSIS — Z9189 Other specified personal risk factors, not elsewhere classified: Secondary | ICD-10-CM | POA: Diagnosis not present

## 2019-09-28 DIAGNOSIS — N179 Acute kidney failure, unspecified: Secondary | ICD-10-CM | POA: Diagnosis present

## 2019-09-28 DIAGNOSIS — E876 Hypokalemia: Secondary | ICD-10-CM | POA: Diagnosis present

## 2019-09-28 DIAGNOSIS — Z20822 Contact with and (suspected) exposure to covid-19: Secondary | ICD-10-CM | POA: Diagnosis present

## 2019-09-28 DIAGNOSIS — K56600 Partial intestinal obstruction, unspecified as to cause: Secondary | ICD-10-CM

## 2019-09-28 DIAGNOSIS — L8990 Pressure ulcer of unspecified site, unspecified stage: Secondary | ICD-10-CM | POA: Insufficient documentation

## 2019-09-28 DIAGNOSIS — L89023 Pressure ulcer of left elbow, stage 3: Secondary | ICD-10-CM | POA: Diagnosis present

## 2019-09-28 DIAGNOSIS — S72402D Unspecified fracture of lower end of left femur, subsequent encounter for closed fracture with routine healing: Secondary | ICD-10-CM

## 2019-09-28 DIAGNOSIS — R05 Cough: Secondary | ICD-10-CM | POA: Diagnosis present

## 2019-09-28 DIAGNOSIS — Z79899 Other long term (current) drug therapy: Secondary | ICD-10-CM

## 2019-09-28 DIAGNOSIS — J9811 Atelectasis: Secondary | ICD-10-CM | POA: Diagnosis present

## 2019-09-28 DIAGNOSIS — E1151 Type 2 diabetes mellitus with diabetic peripheral angiopathy without gangrene: Secondary | ICD-10-CM | POA: Diagnosis present

## 2019-09-28 DIAGNOSIS — K8020 Calculus of gallbladder without cholecystitis without obstruction: Secondary | ICD-10-CM | POA: Diagnosis present

## 2019-09-28 DIAGNOSIS — Z95828 Presence of other vascular implants and grafts: Secondary | ICD-10-CM

## 2019-09-28 LAB — CBC WITH DIFFERENTIAL/PLATELET
Abs Immature Granulocytes: 0.04 K/uL (ref 0.00–0.07)
Basophils Absolute: 0 K/uL (ref 0.0–0.1)
Basophils Relative: 0 %
Eosinophils Absolute: 0.2 K/uL (ref 0.0–0.5)
Eosinophils Relative: 2 %
HCT: 50 % (ref 39.0–52.0)
Hemoglobin: 16 g/dL (ref 13.0–17.0)
Immature Granulocytes: 0 %
Lymphocytes Relative: 21 %
Lymphs Abs: 2.4 K/uL (ref 0.7–4.0)
MCH: 25.9 pg — ABNORMAL LOW (ref 26.0–34.0)
MCHC: 32 g/dL (ref 30.0–36.0)
MCV: 81 fL (ref 80.0–100.0)
Monocytes Absolute: 0.9 K/uL (ref 0.1–1.0)
Monocytes Relative: 8 %
Neutro Abs: 7.9 K/uL — ABNORMAL HIGH (ref 1.7–7.7)
Neutrophils Relative %: 69 %
Platelets: 473 K/uL — ABNORMAL HIGH (ref 150–400)
RBC: 6.17 MIL/uL — ABNORMAL HIGH (ref 4.22–5.81)
RDW: 16.3 % — ABNORMAL HIGH (ref 11.5–15.5)
WBC: 11.4 K/uL — ABNORMAL HIGH (ref 4.0–10.5)
nRBC: 0 % (ref 0.0–0.2)

## 2019-09-28 LAB — COMPREHENSIVE METABOLIC PANEL
ALT: 24 U/L (ref 0–44)
AST: 20 U/L (ref 15–41)
Albumin: 3.3 g/dL — ABNORMAL LOW (ref 3.5–5.0)
Alkaline Phosphatase: 135 U/L — ABNORMAL HIGH (ref 38–126)
Anion gap: 11 (ref 5–15)
BUN: 28 mg/dL — ABNORMAL HIGH (ref 8–23)
CO2: 35 mmol/L — ABNORMAL HIGH (ref 22–32)
Calcium: 10.3 mg/dL (ref 8.9–10.3)
Chloride: 98 mmol/L (ref 98–111)
Creatinine, Ser: 1.78 mg/dL — ABNORMAL HIGH (ref 0.61–1.24)
GFR calc Af Amer: 43 mL/min — ABNORMAL LOW (ref >60)
GFR calc non Af Amer: 37 mL/min — ABNORMAL LOW (ref >60)
Glucose, Bld: 115 mg/dL — ABNORMAL HIGH (ref 70–99)
Potassium: 3 mmol/L — ABNORMAL LOW (ref 3.5–5.1)
Sodium: 144 mmol/L (ref 135–145)
Total Bilirubin: 1 mg/dL (ref 0.3–1.2)
Total Protein: 7.3 g/dL (ref 6.5–8.1)

## 2019-09-28 LAB — SARS CORONAVIRUS 2 BY RT PCR (HOSPITAL ORDER, PERFORMED IN ~~LOC~~ HOSPITAL LAB): SARS Coronavirus 2: NEGATIVE

## 2019-09-28 LAB — LACTIC ACID, PLASMA: Lactic Acid, Venous: 2 mmol/L (ref 0.5–1.9)

## 2019-09-28 LAB — PROCALCITONIN: Procalcitonin: <0.1 ng/mL

## 2019-09-28 LAB — LIPASE, BLOOD: Lipase: 22 U/L (ref 11–51)

## 2019-09-28 MED ORDER — SODIUM CHLORIDE 0.9 % IV SOLN
500.0000 mg | Freq: Once | INTRAVENOUS | Status: AC
  Administered 2019-09-28: 500 mg via INTRAVENOUS
  Filled 2019-09-28: qty 500

## 2019-09-28 MED ORDER — POTASSIUM CHLORIDE 10 MEQ/100ML IV SOLN
10.0000 meq | INTRAVENOUS | Status: AC
  Administered 2019-09-28 (×4): 10 meq via INTRAVENOUS
  Filled 2019-09-28 (×4): qty 100

## 2019-09-28 MED ORDER — IOHEXOL 300 MG/ML  SOLN
75.0000 mL | Freq: Once | INTRAMUSCULAR | Status: AC | PRN
  Administered 2019-09-28: 75 mL via INTRAVENOUS

## 2019-09-28 MED ORDER — SODIUM CHLORIDE 0.9 % IV SOLN
1.0000 g | INTRAVENOUS | Status: DC
  Administered 2019-09-29 – 2019-10-01 (×3): 1 g via INTRAVENOUS
  Filled 2019-09-28: qty 1
  Filled 2019-09-28: qty 10
  Filled 2019-09-28: qty 1
  Filled 2019-09-28: qty 10

## 2019-09-28 MED ORDER — SODIUM CHLORIDE 0.9 % IV SOLN
1.0000 g | Freq: Once | INTRAVENOUS | Status: AC
  Administered 2019-09-28: 1 g via INTRAVENOUS
  Filled 2019-09-28: qty 10

## 2019-09-28 MED ORDER — ENOXAPARIN SODIUM 40 MG/0.4ML ~~LOC~~ SOLN
40.0000 mg | SUBCUTANEOUS | Status: DC
  Administered 2019-09-28 – 2019-10-01 (×4): 40 mg via SUBCUTANEOUS
  Filled 2019-09-28 (×4): qty 0.4

## 2019-09-28 MED ORDER — POTASSIUM CHLORIDE 10 MEQ/100ML IV SOLN: 10.0000 meq | INTRAVENOUS | Status: DC

## 2019-09-28 MED ORDER — HYDRALAZINE HCL 20 MG/ML IJ SOLN: 5.0000 mg | Freq: Three times a day (TID) | INTRAMUSCULAR | Status: DC | PRN

## 2019-09-28 MED ORDER — SODIUM CHLORIDE 0.9 % IV SOLN
500.0000 mg | INTRAVENOUS | Status: DC
  Administered 2019-09-29 – 2019-10-01 (×3): 500 mg via INTRAVENOUS
  Filled 2019-09-28 (×4): qty 500

## 2019-09-28 MED ORDER — ONDANSETRON HCL 4 MG/2ML IJ SOLN: 4.0000 mg | Freq: Four times a day (QID) | INTRAMUSCULAR | Status: DC | PRN

## 2019-09-28 MED ORDER — SODIUM CHLORIDE 0.9% FLUSH
3.0000 mL | Freq: Two times a day (BID) | INTRAVENOUS | Status: DC
  Administered 2019-09-29 – 2019-10-01 (×4): 3 mL via INTRAVENOUS

## 2019-09-28 MED ORDER — ONDANSETRON HCL 4 MG PO TABS: 4.0000 mg | ORAL_TABLET | Freq: Four times a day (QID) | ORAL | Status: DC | PRN

## 2019-09-28 MED ORDER — METOPROLOL TARTRATE 5 MG/5ML IV SOLN
5.0000 mg | Freq: Two times a day (BID) | INTRAVENOUS | Status: DC
  Administered 2019-09-28 – 2019-10-02 (×4): 5 mg via INTRAVENOUS
  Filled 2019-09-28 (×7): qty 5

## 2019-09-28 MED ORDER — SODIUM CHLORIDE 0.9 % IV BOLUS
1000.0000 mL | Freq: Once | INTRAVENOUS | Status: AC
  Administered 2019-09-28: 1000 mL via INTRAVENOUS

## 2019-09-28 NOTE — Progress Notes (Signed)
EMS report: pt states G tube site burst 2weeks ago, leaking greenish clear fluid. From [employer]. Chest xray shows possible possible PNA with possible bowel obs. COvid neg yesterday, vomit. All day yesterday. Pain 6/10 at port site. VS 150 CBG, T 97, BP 126/72 P 65 [id number] R16. Not ambulatory, DNR.

## 2019-09-28 NOTE — ED Notes (Signed)
RN called to give report, receiving RN unable to take report at this moment. Will call back.

## 2019-09-28 NOTE — ED Notes (Signed)
PT STATED HE IS UNABLE TO GIVE URINE SAMPLE AT THIS TIME. URINAL GIVEN.

## 2019-09-28 NOTE — ED Notes (Signed)
RN called to give report, receiving RN in a room with a pt. Will call back.

## 2019-09-28 NOTE — H&P (Signed)
History and Physical        Hospital Admission Note Date: 09/28/2019  Patient name: [name] [name] Medical record number: [id number] Date of birth: November 14, 1945 Age: 74 y.o. Gender: male  PCP: Patient, No Pcp Per  Patient coming from: [facility]   Chief Complaint    Chief Complaint  Patient presents with  . leaking G tube site      HPI:   This is a 74 year old male with a past medical history of CVA with left-sided hemiplegia, left distal femur fracture without surgical intervention, HFrEF (last EF 2025% in 2019) type 2 diabetes, atrial fibrillation, prior bowel obstruction, dysphagia with chronic PEG tube, hypertension, who follows with palliative care ([facility]) who presented to the ED with multiple complaints per the ED physician including abdominal pain that is intermittent, nausea, vomiting, decreased flatus, no bowel movements as well as chronic cough with apparently recently diagnosed pneumonia yesterday but not on antibiotics in addition to PEG tube falling out about 2 weeks ago with subsequent green fluid draining from his abdomen [name] since.  Reports that over the last few days he has had severe abdominal pain intermittently.  Has not tolerated much p.o. intake over the past 48 hours.  No urinary issues.  Reported to the ED physician that he has not had any bowel movements or flatus since the onset of the pain.  However, upon my personal interview the patient states he has been having bowel movements and has been passing gas and has been tolerating p.o. intake.  ED Course: Afebrile and hemodynamically stable on room air.  Notable labs: K3.0, bicarb 35, glucose 115, BUN 28, creatinine 1.78 (previously 1.3 in 2019), lactic acid 2.0, WBC 11.4, COVID-19 negative.  CXR with overall improved appearance from 2019 with opacity at the left lung base potentially representing a  chronic scarring/atelectasis versus persistent/recurrent pleural fluid.  CT abdomen pelvis with contrast: Groundglass and consolidative opacities in the left lower lobe reflecting pneumonia or aspiration, single dilated loop of jejunum posterior to the stomach that is unchanged from priors, multiple indeterminate left renal lesions which have increased since prior study.  He was given fluids, ceftriaxone and azithromycin and general surgery was consulted for partial SBO as well as loss of PEG tube.  Vitals:   09/28/19 1517 09/28/19 1530  BP: (!) 146/65 129/60  Pulse: 96 93  Resp: 16 18  Temp:    SpO2: 95% 95%     Review of Systems:  Review of Systems  Respiratory: Negative for shortness of breath and wheezing.   All other systems reviewed and are negative.   Medical/Social/Family History   Past Medical History: Past Medical History:  Diagnosis Date  . Chronic systolic CHF (congestive heart failure) ([place])   . CVA, old, aphasia   . Diabetes ([place])   . Hemiplegia ([place])   . HTN (hypertension)   . Intestinal obstruction ([place]) 11/2015  . Peripheral vascular disease ([place])   . Pneumonia   . Stroke [facility])     Past Surgical History:  Procedure Laterality Date  . ABDOMINAL ADHESION SURGERY  11/2015  . PERIPHERAL VASCULAR CATHETERIZATION     R-EIA stent, possibly R-Subclavian stent 2017    Medications: Prior to Admission  medications   Medication Sig Start Date End Date Taking? Authorizing Provider  acetaminophen (TYLENOL) 325 MG tablet Place 650 mg into feeding tube every 6 (six) hours as needed for mild pain.   Yes [provider]  amiodarone (PACERONE) 200 MG tablet Take 200 mg by mouth daily.    Yes [provider]  aspirin 81 MG chewable tablet Chew 81 mg by mouth daily.    Yes [provider]  atorvastatin (LIPITOR) 40 MG tablet Take 40 mg by mouth at bedtime.    Yes [provider]  carvedilol (COREG) 6.25 MG tablet Take 1 tablet (6.25 mg  total) by mouth 2 (two) times daily with a meal. 03/28/17  Yes Regalado, Belkys A, MD  Cholecalciferol (VITAMIN D-3) 125 MCG (5000 UT) TABS Take 1 tablet by mouth daily.   Yes [provider]  esomeprazole (NEXIUM) 20 MG capsule Take 20 mg by mouth daily. 08/23/19  Yes [provider]  ferrous sulfate 325 (65 FE) MG EC tablet Take 325 mg by mouth in the morning and at bedtime.   Yes [provider]  furosemide (LASIX) 40 MG tablet Take 40 mg by mouth 2 (two) times daily. 09/02/19  Yes [provider]  gabapentin (NEURONTIN) 400 MG capsule Take 400 mg by mouth 2 (two) times daily.   Yes [provider]  magnesium oxide (MAG-OX) 400 MG tablet Take 400 mg by mouth 2 (two) times daily.    Yes [provider]  promethazine (PHENERGAN) 12.5 MG suppository Place 12.5 mg rectally every 6 (six) hours as needed for nausea or vomiting.  09/27/19  Yes [provider]  promethazine (PHENERGAN) 25 MG/ML injection Inject 25 mg into the muscle daily as needed for nausea or vomiting.   Yes [provider]  sacubitril-valsartan (ENTRESTO) 24-26 MG Take 1 tablet by mouth 2 (two) times daily. 03/28/17  Yes Regalado, Belkys A, MD  traMADol (ULTRAM) 50 MG tablet Take 50 mg by mouth 3 (three) times daily as needed for moderate pain or severe pain.  09/10/19  Yes [provider]  vitamin C (ASCORBIC ACID) 500 MG tablet Take 500 mg by mouth 2 (two) times daily.    Yes [provider]  cephALEXin (KEFLEX) 500 MG capsule Place 1 capsule (500 mg total) into feeding tube 2 (two) times daily. Patient not taking: Reported on 09/28/2019 01/09/19   Ezequiel Essex, MD  furosemide (LASIX) 20 MG tablet Place 2 tablets (40 mg total) into feeding tube daily. Patient not taking: Reported on 09/28/2019 03/28/17   Regalado, Jerald Kief A, MD  spironolactone (ALDACTONE) 25 MG tablet Take 0.5 tablets (12.5 mg total) by mouth daily. Patient not taking: Reported on  09/28/2019 03/28/17   Niel Hummer A, MD    Allergies:  No Known Allergies  Social History:  reports that he has quit smoking. He has never used smokeless tobacco. He reports that he does not drink alcohol and does not use drugs.  Family History: Family History  Problem Relation Age of Onset  . CAD Neg Hx      Objective   Physical Exam: Blood pressure 129/60, pulse 93, temperature 98.2 F (36.8 C), temperature source Oral, resp. rate 18, SpO2 95 %.  Physical Exam Vitals and nursing note reviewed.  Constitutional:      General: He is not in acute distress. HENT:     Head: Normocephalic.  Eyes:     Conjunctiva/sclera: Conjunctivae normal.  Cardiovascular:     Rate and  Rhythm: Normal rate and regular rhythm.  Pulmonary:     Effort: Pulmonary effort is normal.     Breath sounds: No wheezing.     Comments: Audible secretions Difficult to auscultate Abdominal:     General: There is no distension.     Tenderness: There is abdominal tenderness.     Comments: Multiple healed surgical scars Left abdominal dressing with green tinge where PEG tube was  Musculoskeletal:     Comments: LLE swelling, rotated outward, unable to move  Neurological:     Mental Status: He is alert. Mental status is at baseline.  Psychiatric:        Mood and Affect: Mood normal.        Behavior: Behavior normal.     LABS on Admission: I have personally reviewed all the labs and imaging below    Basic Metabolic Panel: Recent Labs  Lab 09/28/19 1230  NA 144  K 3.0*  CL 98  CO2 35*  GLUCOSE 115*  BUN 28*  CREATININE 1.78*  CALCIUM 10.3   Liver Function Tests: Recent Labs  Lab 09/28/19 1230  AST 20  ALT 24  ALKPHOS 135*  BILITOT 1.0  PROT 7.3  ALBUMIN 3.3*   Recent Labs  Lab 09/28/19 1230  LIPASE 22   No results for input(s): AMMONIA in the last 168 hours. CBC: Recent Labs  Lab 09/28/19 1230  WBC 11.4*  NEUTROABS 7.9*  HGB 16.0  HCT 50.0  MCV 81.0  PLT 473*    Cardiac Enzymes: No results for input(s): CKTOTAL, CKMB, CKMBINDEX, TROPONINI in the last 168 hours. BNP: Invalid input(s): POCBNP CBG: No results for input(s): GLUCAP in the last 168 hours.  Radiological Exams on Admission:  CT ABDOMEN PELVIS W CONTRAST  Result Date: 09/28/2019 CLINICAL DATA:  Vomiting and severe abdominal pain EXAM: CT ABDOMEN AND PELVIS WITH CONTRAST TECHNIQUE: Multidetector CT imaging of the abdomen and pelvis was performed using the standard protocol following bolus administration of intravenous contrast. CONTRAST:  85mL OMNIPAQUE IOHEXOL 300 MG/ML  SOLN COMPARISON:  December 09, 2015 FINDINGS: Lower chest: Heterogeneous ground-glass and consolidative opacities most predominant in the LEFT lower lobe. Mild RIGHT basilar atelectasis. Multifocal endobronchial plugging and mild bronchial wall thickening. Cardiomegaly. Hepatobiliary: The gallbladder is filled with high-density material as well as multiple gallstones, similar in comparison to prior. Portal vein is patent. Unchanged appearance of prominent intrahepatic biliary ducts. Pancreas: No pancreatic ductal dilation. Partial fatty replacement of the pancreas. No definitive peripancreatic inflammation. Spleen: Unchanged lobulated appearance of the spleen. Adrenals/Urinary Tract: Unchanged nodularity of the LEFT adrenal gland. Multiple bilateral renal cysts. Subcentimeter hypodense lesions are too small to accurately characterize. There has been increase in size of an indeterminate isodense LEFT renal lesion which measures 2.7 cm, previously 2.1 cm (series 2, image 28). Additional indeterminate lesion of the inner LEFT kidney is increased in size and measures 15 mm, previously 8 mm (series 2, image 38). A third indeterminate lesion of the inner superior kidney measures 14 mm, not previously visualized (series 2, image 30). The bladder is decompressed. There are several punctate calcifications along the LEFT posterior wall of the  bladder. Stomach/Bowel: Stomach remains in close association with the anterior abdominal wall. There is a loop of mildly dilated jejunum posterior to the stomach, unchanged in size in comparison to more remote priors. It measures up to 3.2 cm. Diverticulosis. The appendix is unremarkable. Moderate colonic stool burden most predominant in the rectum and RIGHT hemicolon. Vascular/Lymphatic: Severe atherosclerotic calcifications. IVC  filter. Bilateral iliac stents. Reproductive: Prostate is present. Other: No free fluid.  No free air. Musculoskeletal: Chronic deformity of the LEFT proximal femur. Status post LEFT femoral intramedullary rod fixation. Osteopenia. L4 hemangioma. IMPRESSION: 1. Heterogeneous ground-glass and consolidative opacities most predominant in the LEFT lower lobe. This may reflect pneumonia or aspiration. 2. There is a single dilated loop of jejunum posterior to the stomach. This is unchanged in comparison to priors. Differential consideration includes early small-bowel obstruction versus chronic dilation. 3. Multiple indeterminate left renal lesions, all of which have increased in size in comparison to prior study. These could reflect complex cysts or solid masses. Further nonemergent evaluation with renal protocol CT or MRI is recommended. 4. Punctate calcifications along the posterior wall of the bladder may reflect bladder stones. Aortic Atherosclerosis (ICD10-I70.0). Electronically Signed   By: Valentino Saxon MD   On: 09/28/2019 13:52   DG Chest Portable 1 View  Result Date: 09/28/2019 CLINICAL DATA:  74 year old male with a history of cough and abdominal pain EXAM: PORTABLE CHEST 1 VIEW COMPARISON:  03/26/2017 FINDINGS: Cardiomediastinal silhouette unchanged in size and contour. No evidence of interlobular septal thickening. Coarsened interstitial markings of the lungs, similar to the prior. Improved opacity on the right compared to the prior plain film. Persisting opacity at the left  lung base partially obscuring the left hemidiaphragm and left heart border. No pneumothorax. Vascular stent system in the right subclavian vein. IMPRESSION: Overall improved appearance of the lungs compared to the plain film of 2019, with opacity at the left lung base potentially representing chronic scarring/atelectasis versus persistent/recurrent pleural fluid. Electronically Signed   By: Corrie Mckusick D.O.   On: 09/28/2019 12:24      EKG: Not done   A & P   Principal Problem:   SBO (small bowel obstruction) (HCC) Active Problems:   Chronic systolic CHF (congestive heart failure) (HCC)   At risk for aspiration   AKI (acute kidney injury) ([place])   PEG tube malfunction (HCC)   Atrial fibrillation, chronic (HCC)   Hypokalemia   1. Nausea and vomiting concern for possible partial SBO a. Conflicting history between ED and hospitalist interaction, unsure if he is truly having BMs and passing gas b. Clinically partial SBO per ED history c. General surgery has been consulted by the ED d. Keep n.p.o. for now pending surgery recommendations  2. Concern for aspiration pneumonia versus aspiration pneumonitis a. Afebrile and hemodynamically stable on room air however concerning findings on CXR, lactic acid 2.0 and minimally elevated WBC 11.4.  Additionally has been having vomiting and has dysphagia putting him at risk for aspiration b. Will start on ceftriaxone and azithromycin c. Check procalcitonin if negative can discontinue antibiotics d. SLP eval e. Aspiration precautions  3. AKI on CKD 3a a. Likely from poor p.o. intake b. Received 1 L NS bolus in the ED c. Hold off on further fluids given his systolic heart failure history  4. Hypokalemia a. Possibly from GI losses b. Replete IV  5. Dysphagia with chronic PEG tube which has spontaneously been removed a. Appreciate general surgery recommendations  6. Compensated systolic heart failure a. On Entresto, spironolactone,  carvedilol and Lasix at home b. Holding these meds in the setting of AKI and n.p.o. status c. Hold further fluids d. Monitor daily weights and intake/output e. will put on Lopressor twice daily in place of carvedilol for now until he is tolerating p.o. intake  7. Atrial fibrillation, rate controlled a. On amiodarone and carvedilol at  home b. Holding p.o. meds, Lopressor as above  8. Hypertension, stable a. Meds as above  9. Type 2 diabetes a. Currently n.p.o. b. Consider sliding scale once he is tolerating p.o. intake  10. History of CVA with left-sided hemiplegia   DVT prophylaxis: Lovenox   Code Status: Prior  Diet: N.p.o. Family Communication: Admission, patients condition and plan of care including tests being ordered have been discussed with the patient who indicates understanding and agrees with the plan and Code Status.  Disposition Plan: The appropriate patient status for this patient is INPATIENT. Inpatient status is judged to be reasonable and necessary in order to provide the required intensity of service to ensure the patient's safety. The patient's presenting symptoms, physical exam findings, and initial radiographic and laboratory data in the context of their chronic comorbidities is felt to place them at high risk for further clinical deterioration. Furthermore, it is not anticipated that the patient will be medically stable for discharge from the hospital within 2 midnights of admission. The following factors support the patient status of inpatient.   " The patient's presenting symptoms include nausea, vomiting, PEG tube removal. " The worrisome physical exam findings include audible respiratory secretions, abdominal pain. " The initial radiographic and laboratory data are worrisome because of possible pneumonia. " The chronic co-morbidities include systolic heart failure, CVA, type 2 diabetes, PEG tube, left knee fracture without surgical intervention.   * I certify  that at the point of admission it is my clinical judgment that the patient will require inpatient hospital care spanning beyond 2 midnights from the point of admission due to high intensity of service, high risk for further deterioration and high frequency of surveillance required.*   Status is: Inpatient  Remains inpatient appropriate because:Ongoing diagnostic testing needed not appropriate for outpatient work up, IV treatments appropriate due to intensity of illness or inability to take PO and Inpatient level of care appropriate due to severity of illness   Dispo: The patient is from: ALF              Anticipated d/c is to: ALF              Anticipated d/c date is: 3 days              Patient currently is not medically stable to d/c.        The medical decision making on this patient was of high complexity and the patient is at high risk for clinical deterioration, therefore this is a level 3  admission.  Consultants  . General surgery  Procedures  . None  Time Spent on Admission: 72 minutes    Harold Hedge, DO [facility] Pager [phone] 09/28/2019, 4:25 PM

## 2019-09-28 NOTE — ED Provider Notes (Signed)
[facility] DEPT Provider Note   CSN: [id number] Arrival date & time: 09/28/19  1047     History Chief Complaint  Patient presents with  . leaking G tube site    [name] [name] is a 74 y.o. male.  The history is provided by the patient and medical records. No language interpreter was used.  Abdominal Pain Pain location:  Generalized Pain quality: aching, cramping and fullness   Pain radiates to:  Does not radiate Pain severity:  Severe Onset quality:  Gradual Duration:  2 days Timing:  Intermittent Progression:  Waxing and waning Chronicity:  New Relieved by:  Nothing Worsened by:  Nothing Ineffective treatments:  None tried Associated symptoms: constipation, cough, nausea and vomiting   Associated symptoms: no chest pain, no chills, no diarrhea, no dysuria, no fatigue, no flatus and no shortness of breath   Risk factors: multiple surgeries        Past Medical History:  Diagnosis Date  . Chronic systolic CHF (congestive heart failure) ([place])   . CVA, old, aphasia   . Diabetes ([place])   . Hemiplegia ([place])   . HTN (hypertension)   . Intestinal obstruction ([place]) 11/2015  . Peripheral vascular disease ([place])   . Pneumonia   . Stroke [facility])     Patient Active Problem List   Diagnosis Date Noted  . Sepsis ([place])   . At risk for aspiration   . Palliative care by specialist   . Goals of care, counseling/discussion   . Acute respiratory failure with hypoxia ([place]) 03/22/2017  . CVA, old, aphasia 03/22/2017  . Acute on chronic systolic CHF (congestive heart failure) ([place]) 03/22/2017  . Mild renal insufficiency 03/22/2017  . AF (paroxysmal atrial fibrillation) ([place]) 03/22/2017    Past Surgical History:  Procedure Laterality Date  . ABDOMINAL ADHESION SURGERY  11/2015  . PERIPHERAL VASCULAR CATHETERIZATION     R-EIA stent, possibly R-Subclavian stent 2017       Family History  Problem Relation Age of Onset  . CAD Neg Hx      Social History   Tobacco Use  . Smoking status: Former Research scientist (life sciences)  . Smokeless tobacco: Never Used  Vaping Use  . Vaping Use: Never used  Substance Use Topics  . Alcohol use: No  . Drug use: No    Home Medications Prior to Admission medications   Medication Sig Start Date End Date Taking? Authorizing Provider  acetaminophen (TYLENOL) 325 MG tablet Place 650 mg into feeding tube every 6 (six) hours as needed for mild pain.    [provider]  acidophilus (RISAQUAD) CAPS capsule Take 1 capsule by mouth daily.    [provider]  amiodarone (PACERONE) 200 MG tablet Place 200 mg into feeding tube daily.    [provider]  aspirin 81 MG chewable tablet Place 81 mg into feeding tube daily.    [provider]  atorvastatin (LIPITOR) 40 MG tablet Place 40 mg into feeding tube daily.    [provider]  carvedilol (COREG) 6.25 MG tablet Take 1 tablet (6.25 mg total) by mouth 2 (two) times daily with a meal. 03/28/17   Regalado, Belkys A, MD  cephALEXin (KEFLEX) 500 MG capsule Place 1 capsule (500 mg total) into feeding tube 2 (two) times daily. 01/09/19   Rancour, Annie Main, MD  Cholecalciferol (VITAMIN D) 50 MCG (2000 UT) CAPS Take 2,000 Units by mouth daily.    [provider]  famotidine (PEPCID) 20 MG tablet Place 20 mg  into feeding tube daily.    [provider]  ferrous sulfate 300 (60 Fe) MG/5ML syrup Place 300 mg into feeding tube 2 (two) times daily with a meal.    [provider]  furosemide (LASIX) 20 MG tablet Place 2 tablets (40 mg total) into feeding tube daily. 03/28/17   Regalado, Belkys A, MD  gabapentin (NEURONTIN) 400 MG capsule Take 400 mg by mouth 2 (two) times daily.    [provider]  magnesium oxide (MAG-OX) 400 MG tablet Place 400 mg into feeding tube 2 (two) times daily.    [provider]  Menthol, Topical Analgesic, (BIOFREEZE) 4 % GEL Apply topically 2 (two) times daily. To left  knee    [provider]  Multiple Vitamin (MULTIVITAMIN WITH MINERALS) TABS tablet Place 1 tablet into feeding tube daily.    [provider]  sacubitril-valsartan (ENTRESTO) 24-26 MG Take 1 tablet by mouth 2 (two) times daily. 03/28/17   Regalado, Belkys A, MD  spironolactone (ALDACTONE) 25 MG tablet Take 0.5 tablets (12.5 mg total) by mouth daily. 03/28/17   Regalado, Belkys A, MD  vitamin C (ASCORBIC ACID) 500 MG tablet Take 500 mg by mouth daily.    [provider]    Allergies    Patient has no known allergies.  Review of Systems   Review of Systems  Constitutional: Negative for chills and fatigue.  Respiratory: Positive for cough. Negative for shortness of breath.   Cardiovascular: Negative for chest pain.  Gastrointestinal: Positive for abdominal pain, constipation, nausea and vomiting. Negative for diarrhea and flatus.  Genitourinary: Negative for dysuria.  Musculoskeletal: Negative for back pain.  Skin: Negative for wound.  Neurological: Positive for weakness (at baseline). Negative for headaches.  Psychiatric/Behavioral: Negative for agitation.    Physical Exam Updated Vital Signs BP (!) 152/69 (BP Location: Left Arm)   Pulse 64   Temp 98.2 F (36.8 C) (Oral)   Resp 16   SpO2 94%   Physical Exam Vitals and nursing note reviewed.  Constitutional:      General: He is not in acute distress.    Appearance: He is well-developed. He is not ill-appearing or toxic-appearing.  HENT:     Head: Normocephalic and atraumatic.     Nose: No congestion or rhinorrhea.     Mouth/Throat:     Mouth: Mucous membranes are moist.     Pharynx: No oropharyngeal exudate or posterior oropharyngeal erythema.  Eyes:     Conjunctiva/sclera: Conjunctivae normal.     Pupils: Pupils are equal, round, and reactive to light.  Cardiovascular:     Rate and Rhythm: Normal rate and regular rhythm.     Heart sounds: No murmur heard.   Pulmonary:     Effort: Pulmonary  effort is normal. No respiratory distress.     Breath sounds: Rhonchi present. No wheezing or rales.  Chest:     Chest wall: No tenderness.  Abdominal:     General: Abdomen is flat. A surgical scar is present. Bowel sounds are normal.     Palpations: Abdomen is soft.     Tenderness: There is abdominal tenderness. There is no guarding or rebound.    Musculoskeletal:        General: No tenderness.     Cervical back: Neck supple.  Skin:    General: Skin is warm and dry.     Capillary Refill: Capillary refill takes less than 2 seconds.     Findings: No erythema.  Neurological:  Mental Status: He is alert. Mental status is at baseline.     Cranial Nerves: Facial asymmetry present. No dysarthria.     Sensory: No sensory deficit.     Motor: Weakness and abnormal muscle tone present.     Comments: Weakness in left face, left arm, and left leg.  This is at his baseline by patient report.  No sensation difficulty.  Psychiatric:        Mood and Affect: Mood normal.     ED Results / Procedures / Treatments   Labs (all labs ordered are listed, but only abnormal results are displayed) Labs Reviewed  CBC WITH DIFFERENTIAL/PLATELET - Abnormal; Notable for the following components:      Result Value   WBC 11.4 (*)    RBC 6.17 (*)    MCH 25.9 (*)    RDW 16.3 (*)    Platelets 473 (*)    Neutro Abs 7.9 (*)    All other components within normal limits  COMPREHENSIVE METABOLIC PANEL - Abnormal; Notable for the following components:   Potassium 3.0 (*)    CO2 35 (*)    Glucose, Bld 115 (*)    BUN 28 (*)    Creatinine, Ser 1.78 (*)    Albumin 3.3 (*)    Alkaline Phosphatase 135 (*)    GFR calc non Af Amer 37 (*)    GFR calc Af Amer 43 (*)    All other components within normal limits  LACTIC ACID, PLASMA - Abnormal; Notable for the following components:   Lactic Acid, Venous 2.0 (*)    All other components within normal limits  SARS CORONAVIRUS 2 BY RT PCR (HOSPITAL ORDER, [facility])  URINE CULTURE  CULTURE, BLOOD (ROUTINE X 2)  CULTURE, BLOOD (ROUTINE X 2)  LIPASE, BLOOD  LACTIC ACID, PLASMA  URINALYSIS, ROUTINE W REFLEX MICROSCOPIC    EKG None  Radiology CT ABDOMEN PELVIS W CONTRAST  Result Date: 09/28/2019 CLINICAL DATA:  Vomiting and severe abdominal pain EXAM: CT ABDOMEN AND PELVIS WITH CONTRAST TECHNIQUE: Multidetector CT imaging of the abdomen and pelvis was performed using the standard protocol following bolus administration of intravenous contrast. CONTRAST:  3mL OMNIPAQUE IOHEXOL 300 MG/ML  SOLN COMPARISON:  December 09, 2015 FINDINGS: Lower chest: Heterogeneous ground-glass and consolidative opacities most predominant in the LEFT lower lobe. Mild RIGHT basilar atelectasis. Multifocal endobronchial plugging and mild bronchial wall thickening. Cardiomegaly. Hepatobiliary: The gallbladder is filled with high-density material as well as multiple gallstones, similar in comparison to prior. Portal vein is patent. Unchanged appearance of prominent intrahepatic biliary ducts. Pancreas: No pancreatic ductal dilation. Partial fatty replacement of the pancreas. No definitive peripancreatic inflammation. Spleen: Unchanged lobulated appearance of the spleen. Adrenals/Urinary Tract: Unchanged nodularity of the LEFT adrenal gland. Multiple bilateral renal cysts. Subcentimeter hypodense lesions are too small to accurately characterize. There has been increase in size of an indeterminate isodense LEFT renal lesion which measures 2.7 cm, previously 2.1 cm (series 2, image 28). Additional indeterminate lesion of the inner LEFT kidney is increased in size and measures 15 mm, previously 8 mm (series 2, image 38). A third indeterminate lesion of the inner superior kidney measures 14 mm, not previously visualized (series 2, image 30). The bladder is decompressed. There are several punctate calcifications along the LEFT posterior wall of the bladder.  Stomach/Bowel: Stomach remains in close association with the anterior abdominal wall. There is a loop of mildly dilated jejunum posterior to the stomach, unchanged in  size in comparison to more remote priors. It measures up to 3.2 cm. Diverticulosis. The appendix is unremarkable. Moderate colonic stool burden most predominant in the rectum and RIGHT hemicolon. Vascular/Lymphatic: Severe atherosclerotic calcifications. IVC filter. Bilateral iliac stents. Reproductive: Prostate is present. Other: No free fluid.  No free air. Musculoskeletal: Chronic deformity of the LEFT proximal femur. Status post LEFT femoral intramedullary rod fixation. Osteopenia. L4 hemangioma. IMPRESSION: 1. Heterogeneous ground-glass and consolidative opacities most predominant in the LEFT lower lobe. This may reflect pneumonia or aspiration. 2. There is a single dilated loop of jejunum posterior to the stomach. This is unchanged in comparison to priors. Differential consideration includes early small-bowel obstruction versus chronic dilation. 3. Multiple indeterminate left renal lesions, all of which have increased in size in comparison to prior study. These could reflect complex cysts or solid masses. Further nonemergent evaluation with renal protocol CT or MRI is recommended. 4. Punctate calcifications along the posterior wall of the bladder may reflect bladder stones. Aortic Atherosclerosis (ICD10-I70.0). Electronically Signed   By: Valentino Saxon MD   On: 09/28/2019 13:52   DG Chest Portable 1 View  Result Date: 09/28/2019 CLINICAL DATA:  74 year old male with a history of cough and abdominal pain EXAM: PORTABLE CHEST 1 VIEW COMPARISON:  03/26/2017 FINDINGS: Cardiomediastinal silhouette unchanged in size and contour. No evidence of interlobular septal thickening. Coarsened interstitial markings of the lungs, similar to the prior. Improved opacity on the right compared to the prior plain film. Persisting opacity at the left lung  base partially obscuring the left hemidiaphragm and left heart border. No pneumothorax. Vascular stent system in the right subclavian vein. IMPRESSION: Overall improved appearance of the lungs compared to the plain film of 2019, with opacity at the left lung base potentially representing chronic scarring/atelectasis versus persistent/recurrent pleural fluid. Electronically Signed   By: Corrie Mckusick D.O.   On: 09/28/2019 12:24    Procedures Procedures (including critical care time)  CRITICAL CARE Performed by: Gwenyth Allegra Odetta Forness Total critical care time: 35 minutes Critical care time was exclusive of separately billable procedures and treating other patients. Critical care was necessary to treat or prevent imminent or life-threatening deterioration. Critical care was time spent personally by me on the following activities: development of treatment plan with patient and/or surrogate as well as nursing, discussions with consultants, evaluation of patient's response to treatment, examination of patient, obtaining history from patient or surrogate, ordering and performing treatments and interventions, ordering and review of laboratory studies, ordering and review of radiographic studies, pulse oximetry and re-evaluation of patient's condition.   Medications Ordered in ED Medications  cefTRIAXone (ROCEPHIN) 1 g in sodium chloride 0.9 % 100 mL IVPB (has no administration in time range)  azithromycin (ZITHROMAX) 500 mg in sodium chloride 0.9 % 250 mL IVPB (has no administration in time range)  sodium chloride 0.9 % bolus 1,000 mL (has no administration in time range)  iohexol (OMNIPAQUE) 300 MG/ML solution 75 mL (75 mLs Intravenous Contrast Given 09/28/19 1308)    ED Course  I have reviewed the triage vital signs and the nursing notes.  Pertinent labs & imaging results that were available during my care of the patient were reviewed by me and considered in my medical decision making (see chart for  details).    MDM Rules/Calculators/A&P                          Michah Minton is a 74 y.o. male with a past  medical history significant for prior stroke with left-sided weakness, atrial fibrillation, prior bowel obstruction, CHF, PEG tube, diabetes, and hypertension who presents for several complaints including diffuse abdominal pain that is intermittent, nausea, vomiting, decreased flatus, no bowel movements, as well as chronic cough with recent diagnosis of pneumonia yesterday not yet on antibiotics, and his PEG tube fell out 2 weeks ago.  Patient reports that 2 weeks ago, his PEG tube fell out and he has had a steady stream of a green fluid coming out of his abdomen ever since.  He reports the last few days, he has had abdominal pain that gets up to 9 out of 10 in severity and is intermittent.  He reports he is currently pain-free while resting on exam.  He describes significant nausea and vomiting has not tolerated much p.o. over the last 48 hours.  He reports he is able to swallow and eat for both medications and nutrition.  He reports no urinary symptoms.  He has not had any bowel movements or flatus since the onset of pain.  He reports he has a chronic cough that is not different than his baseline but he was told in x-ray yesterday showed concern for possible pneumonia.  He was not started antibiotics were told to come in today.  He denies other complaints.  On exam, abdomen is diffusely tender and bowel sounds were appreciated.  There are numerous surgical scars on it and he has a bandaged PEG tube hole in his left abdomen that is draining a green fluid.  Lungs have some coarseness bilaterally but no significant wheezing.  No murmur.  Patient is weakness in the left side of his face, arm, and leg which he reports is unchanged from baseline.  Normal sensation.  Exam that was unremarkable.  Clinically I am concerned about recurrent bowel obstruction given the nausea, vomiting, abdominal pain,  decreased bowel movement/flatus, and the PEG tube change.  We will get a CT scan to rule this out.  We will get a chest x-ray to look for the pneumonia which he was told he had yesterday.  We will continue to monitor his oxygen saturations as initially it sat 88 but then when he was readjusted it went into the 90s.  He will get screening labs.  Anticipate reassessment after work-up.  2:30 PM CT scan shows likely recurrent partial small bowel obstruction as well as pneumonia.  We will give antibiotics for the pneumonia.  Patient does have what appears to be an AKI and he is feeling dehydrated.  He does have a leukocytosis and his Covid test is negative.  Lactic acid is elevated 2.0.  We will give some fluids for rehydration and antibiotics.  We will call general surgery for the partial small bowel fraction recurrence as well as for the loss of the PEG tube.  He will be admitted for further management.    Final Clinical Impression(s) / ED Diagnoses Final diagnoses:  Partial small bowel obstruction ([place])  Community acquired pneumonia, unspecified laterality  AKI (acute kidney injury) ([place])  PEG tube malfunction ([place])    Rx / DC Orders ED Discharge Orders    None     Clinical Impression: 1. Partial small bowel obstruction ([place])   2. Community acquired pneumonia, unspecified laterality   3. AKI (acute kidney injury) ([place])   4. PEG tube malfunction ([place])     Disposition: Admit  This note was prepared with assistance of Dragon voice recognition software. Occasional wrong-word or sound-a-like substitutions  may have occurred due to the inherent limitations of voice recognition software.     Korion Cuevas, Gwenyth Allegra, MD 09/28/19 [phone]

## 2019-09-29 ENCOUNTER — Encounter (HOSPITAL_COMMUNITY): Payer: Self-pay | Admitting: Internal Medicine

## 2019-09-29 DIAGNOSIS — K56600 Partial intestinal obstruction, unspecified as to cause: Secondary | ICD-10-CM

## 2019-09-29 DIAGNOSIS — J189 Pneumonia, unspecified organism: Secondary | ICD-10-CM

## 2019-09-29 DIAGNOSIS — L8990 Pressure ulcer of unspecified site, unspecified stage: Secondary | ICD-10-CM | POA: Insufficient documentation

## 2019-09-29 LAB — BASIC METABOLIC PANEL
Anion gap: 11 (ref 5–15)
BUN: 25 mg/dL — ABNORMAL HIGH (ref 8–23)
CO2: 28 mmol/L (ref 22–32)
Calcium: 9.5 mg/dL (ref 8.9–10.3)
Chloride: 106 mmol/L (ref 98–111)
Creatinine, Ser: 1.5 mg/dL — ABNORMAL HIGH (ref 0.61–1.24)
GFR calc Af Amer: 53 mL/min — ABNORMAL LOW (ref >60)
GFR calc non Af Amer: 46 mL/min — ABNORMAL LOW (ref >60)
Glucose, Bld: 78 mg/dL (ref 70–99)
Potassium: 3.6 mmol/L (ref 3.5–5.1)
Sodium: 145 mmol/L (ref 135–145)

## 2019-09-29 LAB — CBC
HCT: 43.6 % (ref 39.0–52.0)
Hemoglobin: 13.9 g/dL (ref 13.0–17.0)
MCH: 25.8 pg — ABNORMAL LOW (ref 26.0–34.0)
MCHC: 31.9 g/dL (ref 30.0–36.0)
MCV: 81 fL (ref 80.0–100.0)
Platelets: 359 10*3/uL (ref 150–400)
RBC: 5.38 MIL/uL (ref 4.22–5.81)
RDW: 15.6 % — ABNORMAL HIGH (ref 11.5–15.5)
WBC: 22.7 10*3/uL — ABNORMAL HIGH (ref 4.0–10.5)
nRBC: 0 % (ref 0.0–0.2)

## 2019-09-29 MED ORDER — SODIUM CHLORIDE 0.9 % IV SOLN: INTRAVENOUS | Status: DC

## 2019-09-29 MED ORDER — DEXTROSE-NACL 5-0.9 % IV SOLN: INTRAVENOUS | Status: DC

## 2019-09-29 NOTE — Progress Notes (Addendum)
PROGRESS NOTE  [name] [name] XFG:[id number] DOB: [birth year] DOA: 09/28/2019 PCP: Patient, No Pcp Per   LOS: 1 day   Brief narrative: As per HPI,  Patient is a 74 year old male with a past medical history of CVA with left-sided hemiplegia, left distal femur fracture without surgical intervention, HFrEF (last EF 2025% in 2019) type 2 diabetes, atrial fibrillation, prior bowel obstruction, dysphagia with chronic PEG tube, hypertension, who follows with palliative care ([facility]) who presented to the ED with multiple complaints.  Patient complained of abdominal pain that is intermittent, nausea, vomiting, decreased flatus, no bowel movements as well as chronic cough with apparently recently diagnosed pneumonia.  He was however not on antibiotic.  There is also history of PEG tube falling out 2 weeks back with drainage of greenish fluid and poor oral intolerance.  There was concern that the patient was not moving his bowel.  In the ED patient was afebrile.    Notable labs: K3.0, bicarb 35, glucose 115, BUN 28, creatinine 1.78 (previously 1.3 in 2019), lactic acid 2.0, WBC 11.4, COVID-19 negative.  CXR with overall improved appearance from 2019 with opacity at the left lung base potentially representing a chronic scarring/atelectasis versus persistent/recurrent pleural fluid.  CT abdomen pelvis with contrast: Groundglass and consolidative opacities in the left lower lobe reflecting pneumonia or aspiration, single dilated loop of jejunum posterior to the stomach that is unchanged from priors, multiple indeterminate left renal lesions which have increased since prior study.  He was given fluids, ceftriaxone and azithromycin and general surgery was consulted for partial SBO as well as loss of PEG tube.  Assessment/Plan:  Principal Problem:   SBO (small bowel obstruction) (HCC) Active Problems:   Chronic systolic CHF (congestive heart failure) (HCC)   At risk for aspiration   AKI (acute kidney injury)  ([place])   PEG tube malfunction (HCC)   Atrial fibrillation, chronic (HCC)   Hypokalemia   Pressure injury of skin  Nausea and vomiting concern for possible partial SBO Currently NPO.  No nausea vomiting today.  Will try clears.  Patient not had bowel movement.  Spoke with general surgery Dr. Marlou Starks.  He recommended conservative treatment for partial SBO.  I have informed about the loss of PEG tube. Surgery to address this..   Continue IV fluids.  Lipase of 22.  Check abdominal x-ray in a.m.  PEG tube leakage.  From the history it looks like been going on for 2 weeks now.  Greenish drainage from the PEG tube site.  We will keep n.p.o. for now.  I spoke with the patient daughter on the phone.  She stated that he might have had the PEG tube for at least couple of years now.  He was able to eat food by mouth  and was basically using PEG tube for medications.  We will keep n.p.o. since he has history of  vomiting and concern for partial small bowel obstruction as well.  Aspiration pneumonia versus aspiration pneumonitis. Concern for it. Patient did have infiltrates on the chest x-ray.  Elevated WBC.  History of vomiting and dysphagia.  We will get a speech and swallow evaluation.  On Rocephin and Zithromax.  Check swallow evaluation.  Leukocytosis at 22,000.  Blood cultures pending.  Lactate was slightly elevated.  Procalcitonin less than 0.10.  COVID-19 was negative.  AKI on CKD 3a. Likely secondary to poor oral intake.  Received 1 L of normal saline in the ED.  Creatinine today at 1.5.  Will closely monitor.  Start  the patient on D5 normal saline at this time.  Check BMP in a.m.  Hypokalemia. Likely from GI loss.  Potassium of 3.6.  Replenished.  Will closely monitor.  Check BMP in a.m.  Dysphagia with chronic PEG tube which has spontaneously been removed x 2 weeks. General surgery has been consulted.  Patient will need a new PEG tube placement.  Compensated systolic heart failure.  Currently on  Entresto, spironolactone Coreg and Lasix at home.  Currently on hold due to n.p.o. status..  Continue daily intake and output charting.  On Lopressor and IV hydralazine.  Resume p.o. when okay  Atrial fibrillation, rate controlled.  On amiodarone and Coreg at home.  Currently on metoprolol.  Did need to monitor closely.  Resume p.o. when okay.  Essential hypertension.  Currently on metoprolol and hydralazine IV.Marland Kitchen  Seems to be reasonably controlled at this time.  Type 2 diabetes mellitus. Mentioned in the history.  Blood glucose within normal limits.  No medication seen in the medication list.  If hyperglycemia consider sliding scale insulin.  Currently NPO.  Check hemoglobin A1c.  History of CVA with left-sided hemiplegia Supportive care.  Await PEG tube placement.  Patient is currently at the skilled nursing facility.  Pressure injury of the elbow stage I distal tibia on the left stage III.  Present on admission.  Continue wound care.  Pressure Injury 09/28/19 Elbow Left;Posterior Stage 1 -  Intact skin with non-blanchable redness of a localized area usually over a bony prominence. (Active)  09/28/19 1830  Location: Elbow  Location Orientation: Left;Posterior  Staging: Stage 1 -  Intact skin with non-blanchable redness of a localized area usually over a bony prominence.  Wound Description (Comments):   Present on Admission: Yes     Pressure Injury Tibial Distal;Left;Posterior;Lateral Stage 3 -  Full thickness tissue loss. Subcutaneous fat may be visible but bone, tendon or muscle are NOT exposed. (Active)     Location: Tibial  Location Orientation: Distal;Left;Posterior;Lateral  Staging: Stage 3 -  Full thickness tissue loss. Subcutaneous fat may be visible but bone, tendon or muscle are NOT exposed.  Wound Description (Comments):   Present on Admission: Yes     Pressure Injury 09/28/19 Tibial Distal;Posterior;Right;Lateral Stage 3 -  Full thickness tissue loss. Subcutaneous fat may  be visible but bone, tendon or muscle are NOT exposed. (Active)  09/28/19 1832  Location: Tibial  Location Orientation: Distal;Posterior;Right;Lateral  Staging: Stage 3 -  Full thickness tissue loss. Subcutaneous fat may be visible but bone, tendon or muscle are NOT exposed.  Wound Description (Comments):   Present on Admission:     DVT prophylaxis: enoxaparin (LOVENOX) injection 40 mg Start: 09/28/19 2200  Code Status: DNR  Family Communication: I spoke with the patient's daughter on the phone and updated her about the clinical condition of the patient.   Status is: Inpatient  Remains inpatient appropriate because:IV treatments appropriate due to intensity of illness or inability to take PO and Inpatient level of care appropriate due to severity of illness   Dispo: The patient is from: SNF              Anticipated d/c is to: SNF, was active with [facility].              Anticipated d/c date is: 2 days              Patient currently is not medically stable to d/c.  Consultants:  General surgery-spoke with Dr. Marlou Starks  Procedures:  None  Antibiotics:  . Rocephin and Zithromax IV  Anti-infectives (From admission, onward)   Start     Dose/Rate Route Frequency Ordered Stop   09/29/19 1400  cefTRIAXone (ROCEPHIN) 1 g in sodium chloride 0.9 % 100 mL IVPB        1 g 200 mL/hr over 30 Minutes Intravenous Every 24 hours 09/28/19 1823 10/03/19 1359   09/29/19 1400  azithromycin (ZITHROMAX) 500 mg in sodium chloride 0.9 % 250 mL IVPB        500 mg 250 mL/hr over 60 Minutes Intravenous Every 24 hours 09/28/19 1823 10/03/19 1359   09/28/19 1445  cefTRIAXone (ROCEPHIN) 1 g in sodium chloride 0.9 % 100 mL IVPB        1 g 200 mL/hr over 30 Minutes Intravenous  Once 09/28/19 1430 09/28/19 1626   09/28/19 1445  azithromycin (ZITHROMAX) 500 mg in sodium chloride 0.9 % 250 mL IVPB        500 mg 250 mL/hr over 60 Minutes Intravenous  Once 09/28/19 1430 09/28/19 1626      Subjective:  Today, patient was seen and examined at bedside.  Able to communicate some.  Denies any pain, fever, cough or shortness of breath.  Denies any vomiting or abdominal pain.  Objective: Vitals:   09/29/19 0145 09/29/19 0634  BP: (!) 142/91 (!) 112/56  Pulse: 79 71  Resp: 18 18  Temp: 97.9 F (36.6 C) 98.2 F (36.8 C)  SpO2: 96% 97%    Intake/Output Summary (Last 24 hours) at 09/29/2019 0745 Last data filed at 09/28/2019 1626 Gross per 24 hour  Intake 1350 ml  Output --  Net 1350 ml   Filed Weights   09/28/19 1809  Weight: 84.8 kg   Body mass index is 26.07 kg/m.   Physical Exam:  GENERAL: Patient is alert awake and communicative, dysarthria noted. Not in obvious distress. HENT: No scleral pallor or icterus. Pupils equally reactive to light. Oral mucosa is moist NECK: is supple, no gross swelling noted. CHEST: Decreased breath sounds bilaterally, no obvious wheezes. CVS: S1 and S2 heard, no murmur. Regular rate and rhythm.  ABDOMEN: Multiple healed surgical scars with diffuse abdominal tenderness, abdominal wall dressing at the PEG tube site removal. EXTREMITIES: Left-sided weakness and contracture.  Left lower extremity externally rotated with gross edema of the foot. CNS: Involuntary head movements noted, expressive dysarthria, left sided weakness with contracture. SKIN: warm and dry   Data Review: I have personally reviewed the following laboratory data and studies,  CBC: Recent Labs  Lab 09/28/19 1230 09/29/19 0427  WBC 11.4* 22.7*  NEUTROABS 7.9*  --   HGB 16.0 13.9  HCT 50.0 43.6  MCV 81.0 81.0  PLT 473* 409   Basic Metabolic Panel: Recent Labs  Lab 09/28/19 1230 09/29/19 0427  NA 144 145  K 3.0* 3.6  CL 98 106  CO2 35* 28  GLUCOSE 115* 78  BUN 28* 25*  CREATININE 1.78* 1.50*  CALCIUM 10.3 9.5   Liver Function Tests: Recent Labs  Lab 09/28/19 1230  AST 20  ALT 24  ALKPHOS 135*  BILITOT 1.0  PROT 7.3  ALBUMIN 3.3*    Recent Labs  Lab 09/28/19 1230  LIPASE 22   No results for input(s): AMMONIA in the last 168 hours. Cardiac Enzymes: No results for input(s): CKTOTAL, CKMB, CKMBINDEX, TROPONINI in the last 168 hours. BNP (last 3 results) No results for input(s): BNP in the last 8760 hours.  ProBNP (last 3  results) No results for input(s): PROBNP in the last 8760 hours.  CBG: No results for input(s): GLUCAP in the last 168 hours. Recent Results (from the past 240 hour(s))  SARS Coronavirus 2 by RT PCR (hospital order, performed in [facility] hospital lab) Nasopharyngeal Nasopharyngeal Swab     Status: None   Collection Time: 09/28/19 11:55 AM   Specimen: Nasopharyngeal Swab  Result Value Ref Range Status   SARS Coronavirus 2 NEGATIVE NEGATIVE Final    Comment: (NOTE) SARS-CoV-2 target nucleic acids are NOT DETECTED.  The SARS-CoV-2 RNA is generally detectable in upper and lower respiratory specimens during the acute phase of infection. The lowest concentration of SARS-CoV-2 viral copies this assay can detect is 250 copies / mL. A negative result does not preclude SARS-CoV-2 infection and should not be used as the sole basis for treatment or other patient management decisions.  A negative result may occur with improper specimen collection / handling, submission of specimen other than nasopharyngeal swab, presence of viral mutation(s) within the areas targeted by this assay, and inadequate number of viral copies (<250 copies / mL). A negative result must be combined with clinical observations, patient history, and epidemiological information.  Fact Sheet for Patients:   StrictlyIdeas.no  Fact Sheet for Healthcare Providers: BankingDealers.co.za  This test is not yet approved or  cleared by the [place] FDA and has been authorized for detection and/or diagnosis of SARS-CoV-2 by FDA under an Emergency Use Authorization (EUA).  This EUA  will remain in effect (meaning this test can be used) for the duration of the COVID-19 declaration under Section 564(b)(1) of the Act, 21 U.S.C. section 360bbb-3(b)(1), unless the authorization is terminated or revoked sooner.  Performed at [facility], [place] [address] [place], [place] [address]   Blood culture (routine x 2)     Status: None (Preliminary result)   Collection Time: 09/28/19 12:30 PM   Specimen: Right Antecubital; Blood  Result Value Ref Range Status   Specimen Description   Final    RIGHT ANTECUBITAL BLOOD Performed at [facility], [place] [address] [place], [place] [address]    Special Requests   Final    BOTTLES DRAWN AEROBIC AND ANAEROBIC Blood Culture adequate volume Performed at [place] [address], [place], [place] [address]    Culture PENDING  Incomplete   Report Status PENDING  Incomplete     Studies: CT ABDOMEN PELVIS W CONTRAST  Result Date: 09/28/2019 CLINICAL DATA:  Vomiting and severe abdominal pain EXAM: CT ABDOMEN AND PELVIS WITH CONTRAST TECHNIQUE: Multidetector CT imaging of the abdomen and pelvis was performed using the standard protocol following bolus administration of intravenous contrast. CONTRAST:  42mL OMNIPAQUE IOHEXOL 300 MG/ML  SOLN COMPARISON:  December 09, 2015 FINDINGS: Lower chest: Heterogeneous ground-glass and consolidative opacities most predominant in the LEFT lower lobe. Mild RIGHT basilar atelectasis. Multifocal endobronchial plugging and mild bronchial wall thickening. Cardiomegaly. Hepatobiliary: The gallbladder is filled with high-density material as well as multiple gallstones, similar in comparison to prior. Portal vein is patent. Unchanged appearance of prominent intrahepatic biliary ducts. Pancreas: No pancreatic ductal dilation. Partial fatty replacement of the pancreas. No definitive peripancreatic inflammation. Spleen: Unchanged lobulated appearance of the spleen.  Adrenals/Urinary Tract: Unchanged nodularity of the LEFT adrenal gland. Multiple bilateral renal cysts. Subcentimeter hypodense lesions are too small to accurately characterize. There has been increase in size of an indeterminate isodense LEFT renal lesion which measures 2.7 cm, previously 2.1 cm (series  2, image 28). Additional indeterminate lesion of the inner LEFT kidney is increased in size and measures 15 mm, previously 8 mm (series 2, image 38). A third indeterminate lesion of the inner superior kidney measures 14 mm, not previously visualized (series 2, image 30). The bladder is decompressed. There are several punctate calcifications along the LEFT posterior wall of the bladder. Stomach/Bowel: Stomach remains in close association with the anterior abdominal wall. There is a loop of mildly dilated jejunum posterior to the stomach, unchanged in size in comparison to more remote priors. It measures up to 3.2 cm. Diverticulosis. The appendix is unremarkable. Moderate colonic stool burden most predominant in the rectum and RIGHT hemicolon. Vascular/Lymphatic: Severe atherosclerotic calcifications. IVC filter. Bilateral iliac stents. Reproductive: Prostate is present. Other: No free fluid.  No free air. Musculoskeletal: Chronic deformity of the LEFT proximal femur. Status post LEFT femoral intramedullary rod fixation. Osteopenia. L4 hemangioma. IMPRESSION: 1. Heterogeneous ground-glass and consolidative opacities most predominant in the LEFT lower lobe. This may reflect pneumonia or aspiration. 2. There is a single dilated loop of jejunum posterior to the stomach. This is unchanged in comparison to priors. Differential consideration includes early small-bowel obstruction versus chronic dilation. 3. Multiple indeterminate left renal lesions, all of which have increased in size in comparison to prior study. These could reflect complex cysts or solid masses. Further nonemergent evaluation with renal protocol CT or MRI  is recommended. 4. Punctate calcifications along the posterior wall of the bladder may reflect bladder stones. Aortic Atherosclerosis (ICD10-I70.0). Electronically Signed   By: Valentino Saxon MD   On: 09/28/2019 13:52   DG Chest Portable 1 View  Result Date: 09/28/2019 CLINICAL DATA:  74 year old male with a history of cough and abdominal pain EXAM: PORTABLE CHEST 1 VIEW COMPARISON:  03/26/2017 FINDINGS: Cardiomediastinal silhouette unchanged in size and contour. No evidence of interlobular septal thickening. Coarsened interstitial markings of the lungs, similar to the prior. Improved opacity on the right compared to the prior plain film. Persisting opacity at the left lung base partially obscuring the left hemidiaphragm and left heart border. No pneumothorax. Vascular stent system in the right subclavian vein. IMPRESSION: Overall improved appearance of the lungs compared to the plain film of 2019, with opacity at the left lung base potentially representing chronic scarring/atelectasis versus persistent/recurrent pleural fluid. Electronically Signed   By: Corrie Mckusick D.O.   On: 09/28/2019 12:24      Flora Lipps, MD  [facility] 09/29/2019

## 2019-09-29 NOTE — Progress Notes (Signed)
SLP Cancellation Note  Patient Details Name: [name] [name] MRN: [id number] DOB: 22-Sep-1945   Cancelled treatment:       Reason Eval/Treat Not Completed: Medical issues which prohibited therapy (Per notes, keep NPO pending surgery recommendations. )   Gabriel Rainwater MA, CCC-SLP    Tamberly Pomplun Meryl 09/29/2019, 10:18 AM

## 2019-09-29 NOTE — Consult Note (Signed)
Reason for Consult:vomiting Referring Physician: Dr. Tresa Garter [name] [name] is an 74 y.o. male.  HPI: The patient is a 74 year old black male who has a history of a stroke.  He has some aphasia and some muscular weakness.  He had a previous gastrostomy tube in.  This was pulled out about 1 month ago.  He apparently has been eating without it.  He had an episode of vomiting yesterday.  He underwent a CT scan that showed one small loop of dilated jejunum but the rest of the bowel looked pretty normal.  Of more significance he was found to have a pneumonia and his white count is 22,000.  Past Medical History:  Diagnosis Date  . Chronic systolic CHF (congestive heart failure) ([place])   . CVA, old, aphasia   . Diabetes ([place])   . Hemiplegia ([place])   . HTN (hypertension)   . Intestinal obstruction ([place]) 11/2015  . Peripheral vascular disease ([place])   . Pneumonia   . Stroke [facility])     Past Surgical History:  Procedure Laterality Date  . ABDOMINAL ADHESION SURGERY  11/2015  . PERIPHERAL VASCULAR CATHETERIZATION     R-EIA stent, possibly R-Subclavian stent 2017    Family History  Problem Relation Age of Onset  . CAD Neg Hx     Social History:  reports that he has quit smoking. He has never used smokeless tobacco. He reports that he does not drink alcohol and does not use drugs.  Allergies: No Known Allergies  Medications: I have reviewed the patient's current medications.  Results for orders placed or performed during the hospital encounter of 09/28/19 (from the past 48 hour(s))  SARS Coronavirus 2 by RT PCR (hospital order, performed in [facility] hospital lab) Nasopharyngeal Nasopharyngeal Swab     Status: None   Collection Time: 09/28/19 11:55 AM   Specimen: Nasopharyngeal Swab  Result Value Ref Range   SARS Coronavirus 2 NEGATIVE NEGATIVE    Comment: (NOTE) SARS-CoV-2 target nucleic acids are NOT DETECTED.  The SARS-CoV-2 RNA is generally detectable in upper and  lower respiratory specimens during the acute phase of infection. The lowest concentration of SARS-CoV-2 viral copies this assay can detect is 250 copies / mL. A negative result does not preclude SARS-CoV-2 infection and should not be used as the sole basis for treatment or other patient management decisions.  A negative result may occur with improper specimen collection / handling, submission of specimen other than nasopharyngeal swab, presence of viral mutation(s) within the areas targeted by this assay, and inadequate number of viral copies (<250 copies / mL). A negative result must be combined with clinical observations, patient history, and epidemiological information.  Fact Sheet for Patients:   StrictlyIdeas.no  Fact Sheet for Healthcare Providers: BankingDealers.co.za  This test is not yet approved or  cleared by the [place] FDA and has been authorized for detection and/or diagnosis of SARS-CoV-2 by FDA under an Emergency Use Authorization (EUA).  This EUA will remain in effect (meaning this test can be used) for the duration of the COVID-19 declaration under Section 564(b)(1) of the Act, 21 U.S.C. section 360bbb-3(b)(1), unless the authorization is terminated or revoked sooner.  Performed at [facility], [place] [address], [place], [place] [address]   CBC with Differential     Status: Abnormal   Collection Time: 09/28/19 12:30 PM  Result Value Ref Range   WBC 11.4 (H) 4.0 - 10.5 K/uL   RBC 6.17 (H) 4.22 - 5.81 MIL/uL  Hemoglobin 16.0 13.0 - 17.0 g/dL   HCT 50.0 39 - 52 %   MCV 81.0 80.0 - 100.0 fL   MCH 25.9 (L) 26.0 - 34.0 pg   MCHC 32.0 30.0 - 36.0 g/dL   RDW 16.3 (H) 11.5 - 15.5 %   Platelets 473 (H) 150 - 400 K/uL   nRBC 0.0 0.0 - 0.2 %   Neutrophils Relative % 69 %   Neutro Abs 7.9 (H) 1.7 - 7.7 K/uL   Lymphocytes Relative 21 %   Lymphs Abs 2.4 0.7 - 4.0 K/uL   Monocytes Relative 8 %    Monocytes Absolute 0.9 0 - 1 K/uL   Eosinophils Relative 2 %   Eosinophils Absolute 0.2 0 - 0 K/uL   Basophils Relative 0 %   Basophils Absolute 0.0 0 - 0 K/uL   Immature Granulocytes 0 %   Abs Immature Granulocytes 0.04 0.00 - 0.07 K/uL    Comment: Performed at [facility], [place] [address], [place], [place] [address]  Comprehensive metabolic panel     Status: Abnormal   Collection Time: 09/28/19 12:30 PM  Result Value Ref Range   Sodium 144 135 - 145 mmol/L   Potassium 3.0 (L) 3.5 - 5.1 mmol/L   Chloride 98 98 - 111 mmol/L   CO2 35 (H) 22 - 32 mmol/L   Glucose, Bld 115 (H) 70 - 99 mg/dL    Comment: Glucose reference range applies only to samples taken after fasting for at least 8 hours.   BUN 28 (H) 8 - 23 mg/dL   Creatinine, Ser 1.78 (H) 0.61 - 1.24 mg/dL   Calcium 10.3 8.9 - 10.3 mg/dL   Total Protein 7.3 6.5 - 8.1 g/dL   Albumin 3.3 (L) 3.5 - 5.0 g/dL   AST 20 15 - 41 U/L   ALT 24 0 - 44 U/L   Alkaline Phosphatase 135 (H) 38 - 126 U/L   Total Bilirubin 1.0 0.3 - 1.2 mg/dL   GFR calc non Af Amer 37 (L) >60 mL/min   GFR calc Af Amer 43 (L) >60 mL/min   Anion gap 11 5 - 15    Comment: Performed at [facility], [place] [address] [place], [place] [address]  Lactic acid, plasma     Status: Abnormal   Collection Time: 09/28/19 12:30 PM  Result Value Ref Range   Lactic Acid, Venous 2.0 (HH) 0.5 - 1.9 mmol/L    Comment: CRITICAL RESULT CALLED TO, READ BACK BY AND VERIFIED WITH: Timothy Lasso RN AT 5465 09/28/19 MULLINS,T Performed at [facility], [place] [address] [place], [place] [address]   Lipase, blood     Status: None   Collection Time: 09/28/19 12:30 PM  Result Value Ref Range   Lipase 22 11 - 51 U/L    Comment: Performed at [facility], [place] [address] [place], [place] [address]  Blood culture (routine x 2)     Status: None (Preliminary result)   Collection Time: 09/28/19 12:30 PM   Specimen: BLOOD   Result Value Ref Range   Specimen Description      BLOOD RIGHT ANTECUBITAL Performed at [facility], [place] [address] [place], [place] [address]    Special Requests      BOTTLES DRAWN AEROBIC AND ANAEROBIC Blood Culture adequate volume Performed at [place] [address] [place], [place] [address]    Culture      NO GROWTH <  24 HOURS Performed at [facility], [place] [address], [place], [place] [address]    Report Status PENDING   Procalcitonin - Baseline     Status: None   Collection Time: 09/28/19 12:30 PM  Result Value Ref Range   Procalcitonin <0.10 ng/mL    Comment:        Interpretation: PCT (Procalcitonin) <= 0.5 ng/mL: Systemic infection (sepsis) is not likely. Local bacterial infection is possible. (NOTE)       Sepsis PCT Algorithm           Lower Respiratory Tract                                      Infection PCT Algorithm    ----------------------------     ----------------------------         PCT < 0.25 ng/mL                PCT < 0.10 ng/mL          Strongly encourage             Strongly discourage   discontinuation of antibiotics    initiation of antibiotics    ----------------------------     -----------------------------       PCT 0.25 - 0.50 ng/mL            PCT 0.10 - 0.25 ng/mL               OR       >80% decrease in PCT            Discourage initiation of                                            antibiotics      Encourage discontinuation           of antibiotics    ----------------------------     -----------------------------         PCT >= 0.50 ng/mL              PCT 0.26 - 0.50 ng/mL               AND        <80% decrease in PCT             Encourage initiation of                                             antibiotics       Encourage continuation           of antibiotics    ----------------------------     -----------------------------        PCT >= 0.50 ng/mL                  PCT > 0.50 ng/mL                AND         increase in PCT                  Strongly encourage  initiation of antibiotics    Strongly encourage escalation           of antibiotics                                     -----------------------------                                           PCT <= 0.25 ng/mL                                                 OR                                        > 80% decrease in PCT                                      Discontinue / Do not initiate                                             antibiotics  Performed at [place] [address], [place], [place] [address]   Basic metabolic panel     Status: Abnormal   Collection Time: 09/29/19  4:27 AM  Result Value Ref Range   Sodium 145 135 - 145 mmol/L   Potassium 3.6 3.5 - 5.1 mmol/L   Chloride 106 98 - 111 mmol/L   CO2 28 22 - 32 mmol/L   Glucose, Bld 78 70 - 99 mg/dL    Comment: Glucose reference range applies only to samples taken after fasting for at least 8 hours.   BUN 25 (H) 8 - 23 mg/dL   Creatinine, Ser 1.50 (H) 0.61 - 1.24 mg/dL   Calcium 9.5 8.9 - 10.3 mg/dL   GFR calc non Af Amer 46 (L) >60 mL/min   GFR calc Af Amer 53 (L) >60 mL/min   Anion gap 11 5 - 15    Comment: Performed at [facility], [place] [address] [place], [place] [address]  CBC     Status: Abnormal   Collection Time: 09/29/19  4:27 AM  Result Value Ref Range   WBC 22.7 (H) 4.0 - 10.5 K/uL   RBC 5.38 4.22 - 5.81 MIL/uL   Hemoglobin 13.9 13.0 - 17.0 g/dL   HCT 43.6 39 - 52 %   MCV 81.0 80.0 - 100.0 fL   MCH 25.8 (L) 26.0 - 34.0 pg   MCHC 31.9 30.0 - 36.0 g/dL   RDW 15.6 (H) 11.5 - 15.5 %   Platelets 359 150 - 400 K/uL   nRBC 0.0 0.0 - 0.2 %    Comment: Performed at [facility], [place] [address], [place], [place] [address]    CT ABDOMEN PELVIS W CONTRAST  Result Date: 09/28/2019 CLINICAL DATA:  Vomiting and severe abdominal pain EXAM: CT ABDOMEN  AND PELVIS WITH CONTRAST TECHNIQUE: Multidetector CT imaging of the abdomen and pelvis was performed using the standard protocol following bolus administration of intravenous contrast. CONTRAST:  1mL OMNIPAQUE IOHEXOL 300 MG/ML  SOLN COMPARISON:  December 09, 2015 FINDINGS: Lower chest: Heterogeneous ground-glass and consolidative opacities most predominant in the LEFT lower lobe. Mild RIGHT basilar atelectasis. Multifocal endobronchial plugging and mild bronchial wall thickening. Cardiomegaly. Hepatobiliary: The gallbladder is filled with high-density material as well as multiple gallstones, similar in comparison to prior. Portal vein is patent. Unchanged appearance of prominent intrahepatic biliary ducts. Pancreas: No pancreatic ductal dilation. Partial fatty replacement of the pancreas. No definitive peripancreatic inflammation. Spleen: Unchanged lobulated appearance of the spleen. Adrenals/Urinary Tract: Unchanged nodularity of the LEFT adrenal gland. Multiple bilateral renal cysts. Subcentimeter hypodense lesions are too small to accurately characterize. There has been increase in size of an indeterminate isodense LEFT renal lesion which measures 2.7 cm, previously 2.1 cm (series 2, image 28). Additional indeterminate lesion of the inner LEFT kidney is increased in size and measures 15 mm, previously 8 mm (series 2, image 38). A third indeterminate lesion of the inner superior kidney measures 14 mm, not previously visualized (series 2, image 30). The bladder is decompressed. There are several punctate calcifications along the LEFT posterior wall of the bladder. Stomach/Bowel: Stomach remains in close association with the anterior abdominal wall. There is a loop of mildly dilated jejunum posterior to the stomach, unchanged in size in comparison to more remote priors. It measures up to 3.2 cm. Diverticulosis. The appendix is unremarkable. Moderate colonic stool burden most predominant in the rectum and RIGHT  hemicolon. Vascular/Lymphatic: Severe atherosclerotic calcifications. IVC filter. Bilateral iliac stents. Reproductive: Prostate is present. Other: No free fluid.  No free air. Musculoskeletal: Chronic deformity of the LEFT proximal femur. Status post LEFT femoral intramedullary rod fixation. Osteopenia. L4 hemangioma. IMPRESSION: 1. Heterogeneous ground-glass and consolidative opacities most predominant in the LEFT lower lobe. This may reflect pneumonia or aspiration. 2. There is a single dilated loop of jejunum posterior to the stomach. This is unchanged in comparison to priors. Differential consideration includes early small-bowel obstruction versus chronic dilation. 3. Multiple indeterminate left renal lesions, all of which have increased in size in comparison to prior study. These could reflect complex cysts or solid masses. Further nonemergent evaluation with renal protocol CT or MRI is recommended. 4. Punctate calcifications along the posterior wall of the bladder may reflect bladder stones. Aortic Atherosclerosis (ICD10-I70.0). Electronically Signed   By: Valentino Saxon MD   On: 09/28/2019 13:52   DG Chest Portable 1 View  Result Date: 09/28/2019 CLINICAL DATA:  75 year old male with a history of cough and abdominal pain EXAM: PORTABLE CHEST 1 VIEW COMPARISON:  03/26/2017 FINDINGS: Cardiomediastinal silhouette unchanged in size and contour. No evidence of interlobular septal thickening. Coarsened interstitial markings of the lungs, similar to the prior. Improved opacity on the right compared to the prior plain film. Persisting opacity at the left lung base partially obscuring the left hemidiaphragm and left heart border. No pneumothorax. Vascular stent system in the right subclavian vein. IMPRESSION: Overall improved appearance of the lungs compared to the plain film of 2019, with opacity at the left lung base potentially representing chronic scarring/atelectasis versus persistent/recurrent pleural  fluid. Electronically Signed   By: Corrie Mckusick D.O.   On: 09/28/2019 12:24    Review of Systems  Constitutional: Negative.   HENT: Negative.   Eyes: Negative.   Respiratory: Negative.   Cardiovascular: Negative.   Gastrointestinal:  Positive for nausea and vomiting. Negative for abdominal pain.  Endocrine: Negative.   Genitourinary: Negative.   Musculoskeletal: Negative.   Skin: Negative.   Allergic/Immunologic: Negative.   Neurological: Positive for weakness.  Hematological: Negative.   Psychiatric/Behavioral: Negative.    Blood pressure 135/76, pulse 73, temperature 98 F (36.7 C), temperature source Oral, resp. rate 14, height 5\' 11"  (1.803 m), weight 84.8 kg, SpO2 97 %. Physical Exam Constitutional:      Appearance: Normal appearance. He is not ill-appearing.  HENT:     Head: Normocephalic and atraumatic.     Right Ear: External ear normal.     Left Ear: External ear normal.     Nose: Nose normal.     Mouth/Throat:     Mouth: Mucous membranes are moist.  Eyes:     General: No scleral icterus.    Extraocular Movements: Extraocular movements intact.     Conjunctiva/sclera: Conjunctivae normal.     Pupils: Pupils are equal, round, and reactive to light.  Cardiovascular:     Rate and Rhythm: Normal rate and regular rhythm.     Pulses: Normal pulses.     Heart sounds: Normal heart sounds.     Comments: No pitting edema lower extr Pulmonary:     Effort: Pulmonary effort is normal. No respiratory distress.     Breath sounds: Normal breath sounds.  Abdominal:     General: Abdomen is flat.     Palpations: Abdomen is soft.     Tenderness: There is no abdominal tenderness.  Musculoskeletal:        General: No deformity.     Cervical back: Normal range of motion and neck supple.     Comments: There is some muscular weakness from prior stroke  Lymphadenopathy:     Comments: No groin or cervical lymphadenopathy  Skin:    General: Skin is warm and dry.     Coloration:  Skin is not jaundiced.     Findings: No rash.  Neurological:     Mental Status: He is alert and oriented to person, place, and time.  Psychiatric:        Mood and Affect: Mood normal.        Behavior: Behavior normal.     Assessment/Plan: The patient had 1 small loop of dilated jejunum on his recent CT scan.  The significance of this is unclear.  I suspect that the majority of his current problems come from the pneumonia which would go along with his elevated white count.  I suspect that if you treat his pneumonia his bowel function will improve.  He will likely need a swallow study to make sure he is not aspirating given his recent history of stroke.  We will follow along with you.  If he needs another gastrostomy tube we will be happy to help with that.  [name] [name] 09/29/2019, 6:06 PM

## 2019-09-30 ENCOUNTER — Inpatient Hospital Stay (HOSPITAL_COMMUNITY): Payer: Medicare Other

## 2019-09-30 LAB — COMPREHENSIVE METABOLIC PANEL
ALT: 16 U/L (ref 0–44)
AST: 14 U/L — ABNORMAL LOW (ref 15–41)
Albumin: 2.5 g/dL — ABNORMAL LOW (ref 3.5–5.0)
Alkaline Phosphatase: 86 U/L (ref 38–126)
Anion gap: 10 (ref 5–15)
BUN: 20 mg/dL (ref 8–23)
CO2: 28 mmol/L (ref 22–32)
Calcium: 10 mg/dL (ref 8.9–10.3)
Chloride: 111 mmol/L (ref 98–111)
Creatinine, Ser: 1.3 mg/dL — ABNORMAL HIGH (ref 0.61–1.24)
GFR calc Af Amer: >60 mL/min (ref >60)
GFR calc non Af Amer: 54 mL/min — ABNORMAL LOW (ref >60)
Glucose, Bld: 101 mg/dL — ABNORMAL HIGH (ref 70–99)
Potassium: 3.2 mmol/L — ABNORMAL LOW (ref 3.5–5.1)
Sodium: 149 mmol/L — ABNORMAL HIGH (ref 135–145)
Total Bilirubin: 0.8 mg/dL (ref 0.3–1.2)
Total Protein: 5.6 g/dL — ABNORMAL LOW (ref 6.5–8.1)

## 2019-09-30 LAB — CBC
HCT: 39.3 % (ref 39.0–52.0)
Hemoglobin: 12.5 g/dL — ABNORMAL LOW (ref 13.0–17.0)
MCH: 25.6 pg — ABNORMAL LOW (ref 26.0–34.0)
MCHC: 31.8 g/dL (ref 30.0–36.0)
MCV: 80.5 fL (ref 80.0–100.0)
Platelets: 340 10*3/uL (ref 150–400)
RBC: 4.88 MIL/uL (ref 4.22–5.81)
RDW: 15.3 % (ref 11.5–15.5)
WBC: 8.7 10*3/uL (ref 4.0–10.5)
nRBC: 0 % (ref 0.0–0.2)

## 2019-09-30 LAB — MAGNESIUM: Magnesium: 2 mg/dL (ref 1.7–2.4)

## 2019-09-30 MED ORDER — MUPIROCIN 2 % EX OINT
TOPICAL_OINTMENT | Freq: Every day | CUTANEOUS | Status: DC
  Filled 2019-09-30 (×2): qty 22

## 2019-09-30 NOTE — TOC Progression Note (Signed)
Transition of Care [facility]) - Progression Note    Patient Details  Name: Oshua Mcconaha MRN: [id number] Date of Birth: [birth year]  Transition of Care [facility]) CM/SW Contact  Purcell Mouton, RN Phone Number: 09/30/2019, 2:06 PM  Clinical Narrative:    Pt is from [facility].  Pt received first COVID vaccination on September 05, 2019 [facility]).  Pt will return to [facility].     Expected Discharge Plan and Services                                                 Social Determinants of Health (SDOH) Interventions    Readmission Risk Interventions No flowsheet data found.

## 2019-09-30 NOTE — Evaluation (Signed)
Clinical/Bedside Swallow Evaluation Patient Details  Name: [name] [name] MRN: [id number] Date of Birth: 24-Jul-1945  Today's Date: 09/30/2019 Time: SLP Start Time (ACUTE ONLY): 1119 SLP Stop Time (ACUTE ONLY): 1201 SLP Time Calculation (min) (ACUTE ONLY): 42 min  Past Medical History:  Past Medical History:  Diagnosis Date  . Chronic systolic CHF (congestive heart failure) ([place])   . CVA, old, aphasia   . Diabetes ([place])   . Hemiplegia ([place])   . HTN (hypertension)   . Intestinal obstruction ([place]) 11/2015  . Peripheral vascular disease ([place])   . Pneumonia   . Stroke [facility])    Past Surgical History:  Past Surgical History:  Procedure Laterality Date  . ABDOMINAL ADHESION SURGERY  11/2015  . PERIPHERAL VASCULAR CATHETERIZATION     R-EIA stent, possibly R-Subclavian stent 2017   HPI:  74 yo male with h/o CVA 2017 with aphasia, dysphagia requiring PEG, adm with n/v, constipation - concern for bowel obstruction.  Pt's PEG fell out a few weeks ago and is now being seen for wound care at abdomen.  Pt is on a regular/thin diet at [employer].  Concern is present for pna.  Pt denies choking on emesis..   CXR 9/19 Overall improved appearance of the lungs compared to the plain film of 2019, with opacity at the left lung base potentially representing chronic scarring/atelectasis versus persistent/recurrent pleural fluid.   Assessment / Plan / Recommendation Clinical Impression  Pt presents with clinical indication of oral more than pharyngeal dysphagia from his prior CVA.  He did pass 3 ounce Yale screen however demonstrated suspected aspiration with thin when "washing" down food. Use of icecream helpful to clear oral retention without s/s of aspiration.  Pt's voice is weaker than normal and his cough is Nonproductive.  Facial, trigeminal nerve deficits apparent.  Pt denies pnas and states he has been consuming a regular/thin diet for several months PTA.  Skilled intervention included  determining compensation strategies to mitigate risk but facilitate oral clearance. Recommend dys3/thin vs clear diet. Medicine with icecream.  Will follow up briefly to assure tolerance, using teach back, pt educated to plan and agreeable. SLP Visit Diagnosis: Dysphagia, oropharyngeal phase (R13.12)    Aspiration Risk  Moderate aspiration risk    Diet Recommendation Dysphagia 3 (Mech soft);Thin liquid   Liquid Administration via: Cup;Straw Medication Administration: Whole meds with puree (icecream) Supervision: Comment (set up assist) Postural Changes: Seated upright at 90 degrees;Remain upright for at least 30 minutes after po intake    Other  Recommendations Oral Care Recommendations: Oral care BID   Follow up Recommendations   n/a     Frequency and Duration min 1 x/week  1 week       Prognosis Prognosis for Safe Diet Advancement: Good      Swallow Study   General Date of Onset: 09/30/19 HPI: 74 yo male with h/o CVA 2017 with aphasia, dysphagia requiring PEG, adm with n/v, constipation - concern for bowel obstruction.  Pt's PEG fell out a few weeks ago and is now being seen for wound care at abdomen.  Pt is on a regular/thin diet at [employer].  Concern is present for pna.  Pt denies choking on emesis..   CXR 9/19 Overall improved appearance of the lungs compared to the plain film of 2019, with opacity at the left lung base potentially representing chronic scarring/atelectasis versus persistent/recurrent pleural fluid. Diet Prior to this Study: NPO Temperature Spikes Noted: No Respiratory Status: Room air History of Recent Intubation:  No Behavior/Cognition: Alert;Cooperative;Pleasant mood Oral Cavity Assessment: Within Functional Limits Oral Care Completed by SLP: Yes Oral Cavity - Dentition: Adequate natural dentition Vision: Functional for self-feeding Self-Feeding Abilities: Other (Comment) (pt with decreased ability to use left arm/hand) Patient Positioning:  Upright in bed Baseline Vocal Quality: Low vocal intensity;Other (comment) (voice is not as strong as baseline per pt) Volitional Cough: Weak Volitional Swallow: Able to elicit    Oral/Motor/Sensory Function Overall Oral Motor/Sensory Function: Mild impairment Facial Symmetry: Suspected CN VII (facial) dysfunction;Abnormal symmetry left Facial Strength: Suspected CN VII (facial) dysfunction;Reduced left Facial Sensation: Suspected CN V (Trigeminal) dysfunction;Reduced left Lingual ROM: Within Functional Limits Lingual Symmetry: Within Functional Limits Lingual Strength: Within Functional Limits Velum: Within Functional Limits Mandible: Other (Comment) (dnt)   Ice Chips Ice chips: Within functional limits   Thin Liquid Thin Liquid: Impaired Presentation: Cup;Straw Pharyngeal  Phase Impairments: Cough - Immediate Other Comments: Pt passed 3 ounce Yale water test without coughing within one minute of time.  However immediatley post swallow of liquid to help clear solid retention he overtly coughed x4, concerning for aspiration of liquids.  Use of ice cream to transit masticated solids tolerated without coughing,    Nectar Thick Nectar Thick Liquid: Not tested   Honey Thick Honey Thick Liquid: Not tested   Puree Puree: Within functional limits Presentation: Self Fed;Spoon   Solid     Solid: Impaired Presentation: Self Fed Oral Phase Functional Implications: Left anterior spillage;Left lateral sulci pocketing Other Comments: oral retention in left buccal region and lower labia without pt awareness      Macario Golds 09/30/2019,12:47 PM [name] Lime, MS [place] [facility] [phone]

## 2019-09-30 NOTE — NC FL2 (Signed)
[place] LEVEL OF CARE SCREENING TOOL     IDENTIFICATION  Patient Name: [name] [name] Birthdate: 1945-01-28 Sex: male Admission Date (Current Location): 09/28/2019  [facility] and [place] Number:  Herbalist and Address:  [facility],  [place], [place]      Provider Number: [id number]  Attending Physician Name and Address:  Georgette Shell, MD  Relative Name and Phone Number:  [name] [phone]    Current Level of Care: Hospital Recommended Level of Care: [place] Prior Approval Number:    Date Approved/Denied:   PASRR Number: [id number]  Discharge Plan: SNF    Current Diagnoses: Patient Active Problem List   Diagnosis Date Noted  . Pressure injury of skin 09/29/2019  . SBO (small bowel obstruction) ([place]) 09/28/2019  . AKI (acute kidney injury) ([place]) 09/28/2019  . PEG tube malfunction ([place]) 09/28/2019  . Atrial fibrillation, chronic ([place]) 09/28/2019  . Hypokalemia 09/28/2019  . Sepsis ([place])   . At risk for aspiration   . Palliative care by specialist   . Goals of care, counseling/discussion   . Acute respiratory failure with hypoxia ([place]) 03/22/2017  . CVA, old, aphasia 03/22/2017  . Chronic systolic CHF (congestive heart failure) ([place]) 03/22/2017  . Mild renal insufficiency 03/22/2017  . AF (paroxysmal atrial fibrillation) ([place]) 03/22/2017    Orientation RESPIRATION BLADDER Height & Weight     Self, Time, Situation, Place  O2 Incontinent, External catheter Weight: 84.8 kg Height:  5\' 11"  (180.3 cm)  BEHAVIORAL SYMPTOMS/MOOD NEUROLOGICAL BOWEL NUTRITION STATUS      Incontinent Diet (Dysphagia III)  AMBULATORY STATUS COMMUNICATION OF NEEDS Skin   Extensive Assist Verbally Other (Comment) (Pressure inury to Elbow left posterior stage1, tibial Distal left, posterior, lateral Stage 3, Coccyx Mid, right, left stage1,)                       Personal Care Assistance Level  of Assistance  Bathing, Feeding, Dressing Bathing Assistance: Maximum assistance Feeding assistance: Independent Dressing Assistance: Maximum assistance     Functional Limitations Info  Speech, Sight, Hearing Sight Info: Adequate Hearing Info: Adequate Speech Info: Impaired    SPECIAL CARE FACTORS FREQUENCY  PT (By licensed PT), OT (By licensed OT)     PT Frequency: Eval and Treat OT Frequency: Eval and Treat            Contractures Contractures Info: Not present    Additional Factors Info  Code Status, Allergies Code Status Info: DNR Allergies Info: No Known Allergies           Current Medications (09/30/2019):  This is the current hospital active medication list Current Facility-Administered Medications  Medication Dose Route Frequency Provider Last Rate Last Admin  . azithromycin (ZITHROMAX) 500 mg in sodium chloride 0.9 % 250 mL IVPB  500 mg Intravenous Q24H Harold Hedge, MD 250 mL/hr at 09/29/19 1328 500 mg at 09/29/19 1328  . cefTRIAXone (ROCEPHIN) 1 g in sodium chloride 0.9 % 100 mL IVPB  1 g Intravenous Q24H Harold Hedge, MD 200 mL/hr at 09/30/19 1226 1 g at 09/30/19 1226  . dextrose 5 %-0.9 % sodium chloride infusion   Intravenous Continuous Pokhrel, Laxman, MD 100 mL/hr at 09/30/19 1224 New Bag at 09/30/19 1224  . enoxaparin (LOVENOX) injection 40 mg  40 mg Subcutaneous Q24H Harold Hedge, MD   40 mg at 09/29/19 2147  . hydrALAZINE (APRESOLINE) injection 5 mg  5  mg Intravenous Q8H PRN Harold Hedge, MD      . metoprolol tartrate (LOPRESSOR) injection 5 mg  5 mg Intravenous Q12H Harold Hedge, MD   5 mg at 09/29/19 2147  . mupirocin ointment (BACTROBAN) 2 %   Topical Daily Georgette Shell, MD      . ondansetron [facility]) tablet 4 mg  4 mg Oral Q6H PRN Harold Hedge, MD       Or  . ondansetron [facility]) injection 4 mg  4 mg Intravenous Q6H PRN Harold Hedge, MD      . sodium chloride flush (NS) 0.9 % injection 3 mL  3 mL Intravenous Q12H Harold Hedge, MD   3 mL at 09/29/19 2150     Discharge Medications: Please see discharge summary for a list of discharge medications.  Relevant Imaging Results:  Relevant Lab Results:   Additional Information [id number]  Purcell M[place]on, RN

## 2019-09-30 NOTE — Progress Notes (Signed)
PROGRESS NOTE    Jerrian Mells  YKD:[id number] DOB: [birth year] DOA: 09/28/2019 PCP: Patient, No Pcp Per    Brief Narrative:74 year old male with a past medical history of CVA with left-sided hemiplegia,left distal femur fracture without surgical intervention,HFrEF (last EF 2025% in 2019) type 2 diabetes, atrial fibrillation, prior bowel obstruction, dysphagia with chronic PEG tube, hypertension, who follows with palliative care([facility])who presented to the ED with multiple complaints.  Patient complained of abdominal pain that is intermittent, nausea, vomiting, decreased flatus, no bowel movements as well as chronic cough with apparentlyrecently diagnosed pneumonia.  He was however not on antibiotic.  There is also history of PEG tube falling out 2 weeks back with drainage of greenish fluid and poor oral intolerance.  There was concern that the patient was not moving his bowel.  In the ED patient was afebrile.   Notable labs: K3.0, bicarb 35, glucose 115, BUN 28, creatinine 1.78 (previously 1.3 in 2019), lactic acid 2.0, WBC 11.4, COVID-19 negative. CXR with overall improved appearance from 2019 with opacity at the left lung base potentially representing a chronic scarring/atelectasis versus persistent/recurrent pleural fluid. CT abdomen pelvis with contrast: Groundglass and consolidative opacities in the left lower lobe reflecting pneumonia or aspiration, single dilated loop of jejunum posterior to the stomach that is unchanged from priors, multiple indeterminate left renal lesions which have increased since prior study.He was given fluids, ceftriaxone and azithromycin and general surgery was consulted for partial SBO as well as loss of PEG tube.  Assessment & Plan:   Principal Problem:   SBO (small bowel obstruction) (HCC) Active Problems:   Chronic systolic CHF (congestive heart failure) (HCC)   At risk for aspiration   AKI (acute kidney injury) ([place])   PEG tube malfunction (HCC)    Atrial fibrillation, chronic (HCC)   Hypokalemia   Pressure injury of skin   Nausea and vomiting concern for possible partial SBO-started dysphagia 3 diet today.  Surgery recommending conservative treatment for partial SBO.  Continue slow IV fluids.  PEG tube leakage with concern for partial SBO continue clear liquids for now.  General surgery following.  Ordered swallow evaluation.  Patient has had PEG tube in the past and was receiving only medications through the PEG tube.   He was able to eat food by mouth  and was basically using PEG tube for medications.    Aspiration pneumonia versus aspiration pneumonitis. Concern for it. Patient did have infiltrates on the chest x-ray.  WBC 8.7 down from 22.7. On Rocephin and azithromycin.  Covid negative.  AKI on CKD 3a. Likely secondary to poor oral intake.  Received 1 L of normal saline in the ED.  Creatinine today at 1.3.   Hypokalemia.  Potassium 3.2 replete.  Recheck labs in a.m.  Magnesium 2.0.   Dysphagia with chronic PEG tube which has spontaneously been removed x 2 weeks Patient remains at high risk for aspiration.  Seen by speech today.  Compensated systolic heart failure.  Currently on Entresto, spironolactone Coreg and Lasix at home.  Currently on hold due to n.p.o. status..  Continue daily intake and output charting.  On Lopressor and IV hydralazine.  Resume p.o. when okay  Chronic atrial fibrillation, rate controlled.  On amiodarone and Coreg at home.  Currently on metoprolol.  Did need to monitor closely.  Resume p.o. when okay.  Essential hypertension.  Currently on metoprolol and hydralazine IV.Marland Kitchen  Seems to be reasonably controlled at this time.  Blood pressure 126/82.  Type 2 diabetes mellitus-CBG (last  3)  No results for input(s): GLUCAP in the last 72 hours.  History of CVA with left-sided hemiplegia Supportive care.  Await PEG tube placement.  Patient is currently at the skilled nursing facility.  Pressure  injury of the elbow stage I distal tibia on the left stage III.  Present on admission.  Continue wound care.  Pressure Injury 09/28/19 Elbow Left;Posterior Stage 1 -  Intact skin with non-blanchable redness of a localized area usually over a bony prominence. (Active)  09/28/19 1830  Location: Elbow  Location Orientation: Left;Posterior  Staging: Stage 1 -  Intact skin with non-blanchable redness of a localized area usually over a bony prominence.  Wound Description (Comments):   Present on Admission: Yes     Pressure Injury Tibial Distal;Left;Posterior;Lateral Stage 3 -  Full thickness tissue loss. Subcutaneous fat may be visible but bone, tendon or muscle are NOT exposed. (Active)     Location: Tibial  Location Orientation: Distal;Left;Posterior;Lateral  Staging: Stage 3 -  Full thickness tissue loss. Subcutaneous fat may be visible but bone, tendon or muscle are NOT exposed.  Wound Description (Comments):   Present on Admission: Yes     Pressure Injury 09/28/19 Tibial Distal;Posterior;Right;Lateral Stage 3 -  Full thickness tissue loss. Subcutaneous fat may be visible but bone, tendon or muscle are NOT exposed. (Active)  09/28/19 1832  Location: Tibial  Location Orientation: Distal;Posterior;Right;Lateral  Staging: Stage 3 -  Full thickness tissue loss. Subcutaneous fat may be visible but bone, tendon or muscle are NOT exposed.  Wound Description (Comments):   Present on Admission:      Pressure Injury 09/29/19 Coccyx Mid;Right;Left Stage 1 -  Intact skin with non-blanchable redness of a localized area usually over a bony prominence. Stage 1, with healed ulcers noted (Active)  09/29/19 0800  Location: Coccyx  Location Orientation: Mid;Right;Left  Staging: Stage 1 -  Intact skin with non-blanchable redness of a localized area usually over a bony prominence.  Wound Description (Comments): Stage 1, with healed ulcers noted  Present on Admission: Yes       Estimated body mass  index is 26.07 kg/m as calculated from the following:   Height as of this encounter: 5\' 11"  (1.803 m).   Weight as of this encounter: 84.8 kg.  DVT prophylaxis: Lovenox Code Status: DNR Family Communication: None at bedside Disposition Plan:  Status is: Inpatient  Dispo: The patient is from: SNF              Anticipated d/c is to: SNF              Anticipated d/c date is: > 3 days              Patient currently is not medically stable to d/c.  Patient still n.p.o. trying to work up in placing a PEG tube, surgery following.  Patient with multiple strokes on IV antibiotics for aspiration pneumonia  consultants: General surgery  Procedures none Antimicrobials: Rocephin and azithromycin  Subjective:  Patient resting in bed he is able to communicate his needs.  Denies any pain today asking when he can eat.  He has left-sided weakness from previous stroke. Objective: Vitals:   09/29/19 1347 09/29/19 1957 09/30/19 0908 09/30/19 1326  BP: 135/76 (!) 157/69 (!) 141/56 126/82  Pulse: 73 67 (!) 45 62  Resp: 14 18  15   Temp: 98 F (36.7 C) 97.8 F (36.6 C) 97.6 F (36.4 C) 98.9 F (37.2 C)  TempSrc: Oral Oral Oral Oral  SpO2: 97% 96% 95% 100%  Weight:      Height:        Intake/Output Summary (Last 24 hours) at 09/30/2019 1451 Last data filed at 09/29/2019 1950 Gross per 24 hour  Intake 350 ml  Output 1000 ml  Net -650 ml   Filed Weights   09/28/19 1809  Weight: 84.8 kg    Examination:  General exam: Appears calm and comfortable  Respiratory system: Clear to auscultation. Respiratory effort normal. Cardiovascular system: S1 & S2 heard, RRR. No JVD, murmurs, rubs, gallops or clicks. No pedal edema. Gastrointestinal system: Abdomen is nondistended, soft and nontender. No organomegaly or masses felt. Normal bowel sounds heard. Central nervous system: Alert and oriented.  Left upper extremity and left lower extremity weakness  extremities 1+ edema left more than right. Skin:  No rashes, lesions or ulcers Psychiatry: Judgement and insight appear normal. Mood & affect appropriate.     Data Reviewed: I have personally reviewed following labs and imaging studies  CBC: Recent Labs  Lab 09/28/19 1230 09/29/19 0427 09/30/19 0609  WBC 11.4* 22.7* 8.7  NEUTROABS 7.9*  --   --   HGB 16.0 13.9 12.5*  HCT 50.0 43.6 39.3  MCV 81.0 81.0 80.5  PLT 473* 359 366   Basic Metabolic Panel: Recent Labs  Lab 09/28/19 1230 09/29/19 0427 09/30/19 0609  NA 144 145 149*  K 3.0* 3.6 3.2*  CL 98 106 111  CO2 35* 28 28  GLUCOSE 115* 78 101*  BUN 28* 25* 20  CREATININE 1.78* 1.50* 1.30*  CALCIUM 10.3 9.5 10.0  MG  --   --  2.0   GFR: Estimated Creatinine Clearance: 53.9 mL/min (A) (by C-G formula based on SCr of 1.3 mg/dL (H)). Liver Function Tests: Recent Labs  Lab 09/28/19 1230 09/30/19 0609  AST 20 14*  ALT 24 16  ALKPHOS 135* 86  BILITOT 1.0 0.8  PROT 7.3 5.6*  ALBUMIN 3.3* 2.5*   Recent Labs  Lab 09/28/19 1230  LIPASE 22   No results for input(s): AMMONIA in the last 168 hours. Coagulation Profile: No results for input(s): INR, PROTIME in the last 168 hours. Cardiac Enzymes: No results for input(s): CKTOTAL, CKMB, CKMBINDEX, TROPONINI in the last 168 hours. BNP (last 3 results) No results for input(s): PROBNP in the last 8760 hours. HbA1C: No results for input(s): HGBA1C in the last 72 hours. CBG: No results for input(s): GLUCAP in the last 168 hours. Lipid Profile: No results for input(s): CHOL, HDL, LDLCALC, TRIG, CHOLHDL, LDLDIRECT in the last 72 hours. Thyroid Function Tests: No results for input(s): TSH, T4TOTAL, FREET4, T3FREE, THYROIDAB in the last 72 hours. Anemia Panel: No results for input(s): VITAMINB12, FOLATE, FERRITIN, TIBC, IRON, RETICCTPCT in the last 72 hours. Sepsis Labs: Recent Labs  Lab 09/28/19 1230  PROCALCITON <0.10  LATICACIDVEN 2.0*    Recent Results (from the past 240 hour(s))  SARS Coronavirus 2 by RT PCR  (hospital order, performed in [facility] hospital lab) Nasopharyngeal Nasopharyngeal Swab     Status: None   Collection Time: 09/28/19 11:55 AM   Specimen: Nasopharyngeal Swab  Result Value Ref Range Status   SARS Coronavirus 2 NEGATIVE NEGATIVE Final    Comment: (NOTE) SARS-CoV-2 target nucleic acids are NOT DETECTED.  The SARS-CoV-2 RNA is generally detectable in upper and lower respiratory specimens during the acute phase of infection. The lowest concentration of SARS-CoV-2 viral copies this assay can detect is 250 copies / mL. A negative result does not  preclude SARS-CoV-2 infection and should not be used as the sole basis for treatment or other patient management decisions.  A negative result may occur with improper specimen collection / handling, submission of specimen other than nasopharyngeal swab, presence of viral mutation(s) within the areas targeted by this assay, and inadequate number of viral copies (<250 copies / mL). A negative result must be combined with clinical observations, patient history, and epidemiological information.  Fact Sheet for Patients:   StrictlyIdeas.no  Fact Sheet for Healthcare Providers: BankingDealers.co.za  This test is not yet approved or  cleared by the [place] FDA and has been authorized for detection and/or diagnosis of SARS-CoV-2 by FDA under an Emergency Use Authorization (EUA).  This EUA will remain in effect (meaning this test can be used) for the duration of the COVID-19 declaration under Section 564(b)(1) of the Act, 21 U.S.C. section 360bbb-3(b)(1), unless the authorization is terminated or revoked sooner.  Performed at [facility], [place] [address], [place], [place] [address]   Blood culture (routine x 2)     Status: None (Preliminary result)   Collection Time: 09/28/19 12:30 PM   Specimen: BLOOD  Result Value Ref Range Status   Specimen Description    Final    BLOOD RIGHT ANTECUBITAL Performed at [facility], [place] [address] [place], [place] [address]    Special Requests   Final    BOTTLES DRAWN AEROBIC AND ANAEROBIC Blood Culture adequate volume Performed at [facility] [address] [place], [place] [address]    Culture   Final    NO GROWTH < 24 HOURS Performed at [facility] [address] [place], [place] [address]    Report Status PENDING  Incomplete         Radiology Studies: DG Abd 1 View  Result Date: 09/30/2019 CLINICAL DATA:  Partial small bowel obstruction EXAM: ABDOMEN - 1 VIEW COMPARISON:  01/09/2019 FINDINGS: Extreme right flank is excluded from view. Normal abdominal gas pattern. No gross free intraperitoneal gas. TrapEase inferior vena cava filter and bilateral iliac stents are noted. Left hip ORIF with significant left hip heterotopic ossification noted. IMPRESSION: Normal abdominal gas pattern. Electronically Signed   By: Fidela Salisbury MD   On: 09/30/2019 04:48        Scheduled Meds: . enoxaparin (LOVENOX) injection  40 mg Subcutaneous Q24H  . metoprolol tartrate  5 mg Intravenous Q12H  . mupirocin ointment   Topical Daily  . sodium chloride flush  3 mL Intravenous Q12H   Continuous Infusions: . azithromycin 500 mg (09/29/19 1328)  . cefTRIAXone (ROCEPHIN)  IV 1 g (09/30/19 1226)  . dextrose 5 % and 0.9% NaCl 100 mL/hr at 09/30/19 1224     LOS: 2 days     Georgette Shell, MD 09/30/2019, 2:51 PM

## 2019-09-30 NOTE — Consult Note (Signed)
[facility] Nurse Consult Note: Reason for Consult:LLQ old PEG tube site that continues to have chronic leakage.   Chronic nonhealing pressure injuries to bilateral posterior lower legs.  Present on admission.  Wound type:surgical (abdomen) and pressure (legs)  Pressure Injury POA: Yes Measurement: 3 cm x 3 cm scar to LLQ abdomen with pin point hole in center.  Left posterior leg:  3 cm x 2.4 cm x 0.1 cm with 1 cm circumferential scarring to perimeter- Right posterior/lateral leg:  2 cm x 1 cm x 0.2 cm with 2 cm scar to perimeter.  Wound NOM:VEHM and moist, scarring Drainage (amount, consistency, odor) minimal serosanguinous  No odor.  Periwound:scarring to all.  Dressing procedure/placement/frequency: Cleanse wounds to bilateral lower legs with NS and pat dry. Apply mupirocin ointment and cover with silicone foam.  Reapply daily and change foam every three days.   Will not follow at this time.  Please re-consult if needed.  Domenic Moras MSN, RN, FNP-BC CWON Wound, Ostomy, Continence Nurse Pager [phone]

## 2019-09-30 NOTE — Progress Notes (Addendum)
CC:  Leaking G tube site  Subjective:  He does not complain this AM of abdominal pain. He says he is here for pneumonia.  I took the dressing down, and not much drainage on the dressing.  He also has a stitch sticking out of the midline.  It's a non absorbable stitch so I think it's a fascial stitch.     He says he has not had any issues with swallowing.   Objective: Vital signs in last 24 hours: Temp:  [97.6 F (36.4 C)-98 F (36.7 C)] 97.6 F (36.4 C) (09/20 0908) Pulse Rate:  [45-73] 45 (09/20 0908) Resp:  [14-18] 18 (09/19 1957) BP: (135-157)/(56-76) 141/56 (09/20 0908) SpO2:  [95 %-97 %] 95 % (09/20 0908) Last BM Date: 09/26/19 353 IV recorded No p.o. intake recorded. 1000 urine recorded No BM recorded Afebrile heart rate showing bradycardia since 11 PM last evening.  Blood pressure stable NA 149, potassium 3.2, glucose 101, creatinine 1.3, LFTs are normal. WBC 8.7 H/H 12.5/39.3 Platelets 340,000 CT abdomen 9/18: Heterogeneous groundglass consolidative opacities predominantly in the left lower lobe; pneumonia vs aspiration.  Single loop of dilated jejunum posterior to the stomach similar to prior studies.  Multiple indeterminate left renal lesions that have increased in size.  Punctate calcification along the posterior wall of bladder -possible bladder stones Single view abdominal film this a.m. the extreme right flank view was not present.  Normal abdominal gas pattern no gross free intraperitoneal gas inferior vena cava filter and bilateral iliac stents, left ORIF   Intake/Output from previous day: 09/19 0701 - 09/20 0700 In: 353 [I.V.:3; IV Piggyback:350] Out: 1000 [Urine:1000] Intake/Output this shift: No intake/output data recorded.  General appearance: alert, cooperative and no distress  Appears to have multiple surgeries, Midline suture with some drainage around suture.  Some cellulitis around gastrotomy site, but minimal drainage.    Lab Results:   Recent Labs    09/29/19 0427 09/30/19 0609  WBC 22.7* 8.7  HGB 13.9 12.5*  HCT 43.6 39.3  PLT 359 340    BMET Recent Labs    09/29/19 0427 09/30/19 0609  NA 145 149*  K 3.6 3.2*  CL 106 111  CO2 28 28  GLUCOSE 78 101*  BUN 25* 20  CREATININE 1.50* 1.30*  CALCIUM 9.5 10.0   PT/INR No results for input(s): LABPROT, INR in the last 72 hours.  Recent Labs  Lab 09/28/19 1230 09/30/19 0609  AST 20 14*  ALT 24 16  ALKPHOS 135* 86  BILITOT 1.0 0.8  PROT 7.3 5.6*  ALBUMIN 3.3* 2.5*     Lipase     Component Value Date/Time   LIPASE 22 09/28/2019 1230     Medications: . enoxaparin (LOVENOX) injection  40 mg Subcutaneous Q24H  . metoprolol tartrate  5 mg Intravenous Q12H  . sodium chloride flush  3 mL Intravenous Q12H   . azithromycin 500 mg (09/29/19 1328)  . cefTRIAXone (ROCEPHIN)  IV Stopped (09/29/19 2110)  . dextrose 5 % and 0.9% NaCl 100 mL/hr at 09/30/19 0254    Assessment/Plan Hospitalized 11/15-12/10/2015 with multiple embolic CVAs, SBO, exploratory laparotomy/lysis of adhesions, severe dysphagia, home on TNA Hx CVA with left-sided hemiplegia Dysphagia with PEG tube recently removed Aspiration pneumonia versus aspiration pneumonitis AKI Hx compensated systolic heart failure/CAD Hypertension Type 2 diabetes Hx cholelithiasis Hx right subclavian stent  Nausea/vomiting -possible PSBO PEG tube leakage  FEN: N.p.o./IV fluids ID: Ceftriaxone x1 9/18 azithromycin 9/18 >> day 3 DVT: Lovenox Follow-up:  TBD  Plan:  I will review with Dr. Lucia Gaskins.  I think we need to get the swallow study recommended by Dr. Marlou Starks yesterday; it has been ordered.    I would just do local wound care at the 2 abdominal sites for now and we will follow with you.   Starting clears after the Speech eva, we will follow.      LOS: 2 days   JENNINGS,WILLARD 09/30/2019 Please see Amion  Agree with above.  Alphonsa Overall, MD, [facility] Surgery Office phone:   [phone]

## 2019-10-01 LAB — SARS CORONAVIRUS 2 BY RT PCR (HOSPITAL ORDER, PERFORMED IN ~~LOC~~ HOSPITAL LAB): SARS Coronavirus 2: NEGATIVE

## 2019-10-01 NOTE — Progress Notes (Signed)
  Speech Language Pathology Treatment: Dysphagia  Patient Details Name: [name] [name] MRN: [id number] DOB: 02/25/1945 Today's Date: 10/01/2019 Time: 4034-7425 SLP Time Calculation (min) (ACUTE ONLY): 30 min  Assessment / Plan / Recommendation Clinical Impression  Today pt seen to dysphagia diagnostic/treatment to determine readiness for dietary advancement, indication for instrumental evaluation.  No indication of aspiration today but pt continues to take very large boluses with anterior spillage on left.   Cues to consume icecream to facilitate oral clearance helpful.  Pt reports his swallowing abilty to be at baseline.  He reports he feels ready to dc home- advised per MD note, pt needed to be able to consume adequate po.  Pt able to verbalize needs today adequately.    Recommend continue dys3/thin at this time due to oral clearance issues.  Reinforced recommendation for pt to use icecream, mashed potatoes, purees, etc to clear food particles into pharynx that are halted in oral cavity. Pt demonstrated this tactic with use of icecream and angel food cake.  Will follow up one more time to assure managing intake well and reinforcements of strategies indicated/helpful.   Advised RN to recommendations for compensation strategies.    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  Pt's PEG fell out a few weeks ago and is now being seen for wound care at abdomen.  Pt is on a regular/thin diet at [employer].  Concern is present for pna.  Pt denies choking on emesis..   CXR 9/19 Overall improved appearance of the lungs compared to the plain film of 2019, with opacity at the left lung base potentially representing chronic scarring/atelectasis versus persistent/recurrent pleural fluid.  Pt was placed on clear liquid diet yesterday and it was advanced today.  SLP follow up to assure po tolerance given concerns for  asp pna.      SLP Plan  Continue with current plan of care       Recommendations  Diet recommendations: Dysphagia 3 (mechanical soft);Thin liquid Liquids provided via: Cup;Straw Medication Administration:  (whole with icecream) Supervision: Patient able to self feed Compensations: Slow rate;Small sips/bites;Lingual sweep for clearance of pocketing Postural Changes and/or Swallow Maneuvers: Seated upright 90 degrees;Upright 30-60 min after meal                Oral Care Recommendations: Oral care BID SLP Visit Diagnosis: Dysphagia, oropharyngeal phase (R13.12) Plan: Continue with current plan of care       GO                Macario Golds 10/01/2019, 4:55 PM  Kathleen Lime, MS [place] Office [phone]

## 2019-10-01 NOTE — Progress Notes (Signed)
PROGRESS NOTE    [name] [name]  ZDG:[id number] DOB: 28-Jul-1945 DOA: 09/28/2019 PCP: Patient, No Pcp Per    Brief Narrative:74 year old male with a past medical history of CVA with left-sided hemiplegia,left distal femur fracture without surgical intervention,HFrEF (last EF 2025% in 2019) type 2 diabetes, atrial fibrillation, prior bowel obstruction, dysphagia with chronic PEG tube, hypertension, who follows with palliative care([facility])who presented to the ED with multiple complaints.  Patient complained of abdominal pain that is intermittent, nausea, vomiting, decreased flatus, no bowel movements as well as chronic cough with apparentlyrecently diagnosed pneumonia.  He was however not on antibiotic.  There is also history of PEG tube falling out 2 weeks back with drainage of greenish fluid and poor oral intolerance.  There was concern that the patient was not moving his bowel.  In the ED patient was afebrile.   Notable labs: K3.0, bicarb 35, glucose 115, BUN 28, creatinine 1.78 (previously 1.3 in 2019), lactic acid 2.0, WBC 11.4, COVID-19 negative. CXR with overall improved appearance from 2019 with opacity at the left lung base potentially representing a chronic scarring/atelectasis versus persistent/recurrent pleural fluid. CT abdomen pelvis with contrast: Groundglass and consolidative opacities in the left lower lobe reflecting pneumonia or aspiration, single dilated loop of jejunum posterior to the stomach that is unchanged from priors, multiple indeterminate left renal lesions which have increased since prior study.He was given fluids, ceftriaxone and azithromycin and general surgery was consulted for partial SBO as well as loss of PEG tube.  Assessment & Plan:   Principal Problem:   SBO (small bowel obstruction) (HCC) Active Problems:   Chronic systolic CHF (congestive heart failure) (HCC)   At risk for aspiration   AKI (acute kidney injury) ([place])   PEG tube malfunction (HCC)    Atrial fibrillation, chronic (HCC)   Hypokalemia   Pressure injury of skin   Nausea and vomiting concern for possible partial SBO-resolved.start dysphagia 3 diet today. Watch for 24 hrs to see if he has adequate intake.Continue slow IV fluids.  PEG tube leakage with concern for partial SBO-started on dysphagia 3 diet on 10/01/2019.  If he does well with p.o. intake will plan for discharge back to ALF tomorrow.  Covid ordered.  Surgery not planning on placing a PEG tube this admission.  Aspiration pneumonia versus aspiration pneumonitis- Concern for it. Patient did have infiltrates on the chest x-ray.  WBC 8.7 down from 22.7. On Rocephin and azithromycin.  Covid negative.  AKI on CKD 3a. Likely secondary to poor oral intake.  Received 1 L of normal saline in the ED.  Creatinine today at 1.3.   Hypokalemia.  Potassium 3.2 replete.  Recheck labs in a.m.  Magnesium 2.0.   Dysphagia with chronic PEG tube which has spontaneously been removed x 2 weeks Patient remains at high risk for aspiration.  Seen by speech today.  Compensated systolic heart failure.  Currently on Entresto, spironolactone Coreg and Lasix at home.  Currently on hold due to n.p.o. status..  Continue daily intake and output charting.  On Lopressor and IV hydralazine.  Resume p.o. when okay  Chronic atrial fibrillation, rate controlled.  On amiodarone and Coreg at home.  Currently on metoprolol.  Did need to monitor closely.  Resume p.o. when okay.  Essential hypertension.  Currently on metoprolol and hydralazine IV.Marland Kitchen  Seems to be reasonably controlled at this time.  Blood pressure 126/82.  Type 2 diabetes mellitus-CBG (last 3)  No results for input(s): GLUCAP in the last 72 hours.  History of  CVA with left-sided hemiplegia Supportive care.  Await PEG tube placement.  Patient is currently at the skilled nursing facility.  Pressure injury of the elbow stage I distal tibia on the left stage III.  Present on  admission.  Continue wound care.  Pressure Injury 09/28/19 Elbow Left;Posterior Stage 1 -  Intact skin with non-blanchable redness of a localized area usually over a bony prominence. (Active)  09/28/19 1830  Location: Elbow  Location Orientation: Left;Posterior  Staging: Stage 1 -  Intact skin with non-blanchable redness of a localized area usually over a bony prominence.  Wound Description (Comments):   Present on Admission: Yes     Pressure Injury Tibial Distal;Left;Posterior;Lateral Stage 3 -  Full thickness tissue loss. Subcutaneous fat may be visible but bone, tendon or muscle are NOT exposed. (Active)     Location: Tibial  Location Orientation: Distal;Left;Posterior;Lateral  Staging: Stage 3 -  Full thickness tissue loss. Subcutaneous fat may be visible but bone, tendon or muscle are NOT exposed.  Wound Description (Comments):   Present on Admission: Yes     Pressure Injury 09/28/19 Tibial Distal;Posterior;Right;Lateral Stage 3 -  Full thickness tissue loss. Subcutaneous fat may be visible but bone, tendon or muscle are NOT exposed. (Active)  09/28/19 1832  Location: Tibial  Location Orientation: Distal;Posterior;Right;Lateral  Staging: Stage 3 -  Full thickness tissue loss. Subcutaneous fat may be visible but bone, tendon or muscle are NOT exposed.  Wound Description (Comments):   Present on Admission:      Pressure Injury 09/29/19 Coccyx Mid;Right;Left Stage 1 -  Intact skin with non-blanchable redness of a localized area usually over a bony prominence. Stage 1, with healed ulcers noted (Active)  09/29/19 0800  Location: Coccyx  Location Orientation: Mid;Right;Left  Staging: Stage 1 -  Intact skin with non-blanchable redness of a localized area usually over a bony prominence.  Wound Description (Comments): Stage 1, with healed ulcers noted  Present on Admission: Yes       Estimated body mass index is 26.07 kg/m as calculated from the following:   Height as of this  encounter: 5\' 11"  (1.803 m).   Weight as of this encounter: 84.8 kg.  DVT prophylaxis: Lovenox Code Status: DNR Family Communication: None at bedside Disposition Plan:  Status is: Inpatient  Dispo: The patient is from: SNF              Anticipated d/c is to: SNF              Anticipated d/c date is: 9/22              Patient currently is not medically stable to d/c.  Patient has been n.p.o. on IV fluids starting dysphagia 3 diet today.    consultants: General surgery  Procedures none Antimicrobials: Rocephin and azithromycin  Subjective:  He is awake and alert  Anxious to eat something.   Objective: Vitals:   10/01/19 0200 10/01/19 0300 10/01/19 0500 10/01/19 0527  BP:    (!) 155/71  Pulse:    (!) 50  Resp: 18 (!) 21 13 14   Temp:    97.7 F (36.5 C)  TempSrc:    Oral  SpO2:    91%  Weight:      Height:        Intake/Output Summary (Last 24 hours) at 10/01/2019 1313 Last data filed at 10/01/2019 1000 Gross per 24 hour  Intake 4478 ml  Output 900 ml  Net 3578 ml   [employer]  09/28/19 1809  Weight: 84.8 kg    Examination:  General exam: Appears calm and comfortable  Respiratory system: Scattered rhonchi to auscultation. Respiratory effort normal. Cardiovascular system: S1 & S2 heard, RRR. No JVD, murmurs, rubs, gallops or clicks. No pedal edema. Gastrointestinal system: Abdomen is nondistended, soft and nontender. No organomegaly or masses felt. Normal bowel sounds heard. Central nervous system: Alert and oriented.  Left upper extremity and left lower extremity weakness  extremities 1+ edema left more than right. Skin: No rashes, lesions or ulcers Psychiatry: Judgement and insight appear normal. Mood & affect appropriate.     Data Reviewed: I have personally reviewed following labs and imaging studies  CBC: Recent Labs  Lab 09/28/19 1230 09/29/19 0427 09/30/19 0609  WBC 11.4* 22.7* 8.7  NEUTROABS 7.9*  --   --   HGB 16.0 13.9 12.5*  HCT 50.0  43.6 39.3  MCV 81.0 81.0 80.5  PLT 473* 359 923   Basic Metabolic Panel: Recent Labs  Lab 09/28/19 1230 09/29/19 0427 09/30/19 0609  NA 144 145 149*  K 3.0* 3.6 3.2*  CL 98 106 111  CO2 35* 28 28  GLUCOSE 115* 78 101*  BUN 28* 25* 20  CREATININE 1.78* 1.50* 1.30*  CALCIUM 10.3 9.5 10.0  MG  --   --  2.0   GFR: Estimated Creatinine Clearance: 53.9 mL/min (A) (by C-G formula based on SCr of 1.3 mg/dL (H)). Liver Function Tests: Recent Labs  Lab 09/28/19 1230 09/30/19 0609  AST 20 14*  ALT 24 16  ALKPHOS 135* 86  BILITOT 1.0 0.8  PROT 7.3 5.6*  ALBUMIN 3.3* 2.5*   Recent Labs  Lab 09/28/19 1230  LIPASE 22   No results for input(s): AMMONIA in the last 168 hours. Coagulation Profile: No results for input(s): INR, PROTIME in the last 168 hours. Cardiac Enzymes: No results for input(s): CKTOTAL, CKMB, CKMBINDEX, TROPONINI in the last 168 hours. BNP (last 3 results) No results for input(s): PROBNP in the last 8760 hours. HbA1C: No results for input(s): HGBA1C in the last 72 hours. CBG: No results for input(s): GLUCAP in the last 168 hours. Lipid Profile: No results for input(s): CHOL, HDL, LDLCALC, TRIG, CHOLHDL, LDLDIRECT in the last 72 hours. Thyroid Function Tests: No results for input(s): TSH, T4TOTAL, FREET4, T3FREE, THYROIDAB in the last 72 hours. Anemia Panel: No results for input(s): VITAMINB12, FOLATE, FERRITIN, TIBC, IRON, RETICCTPCT in the last 72 hours. Sepsis Labs: Recent Labs  Lab 09/28/19 1230  PROCALCITON <0.10  LATICACIDVEN 2.0*    Recent Results (from the past 240 hour(s))  SARS Coronavirus 2 by RT PCR (hospital order, performed in [facility] hospital lab) Nasopharyngeal Nasopharyngeal Swab     Status: None   Collection Time: 09/28/19 11:55 AM   Specimen: Nasopharyngeal Swab  Result Value Ref Range Status   SARS Coronavirus 2 NEGATIVE NEGATIVE Final    Comment: (NOTE) SARS-CoV-2 target nucleic acids are NOT DETECTED.  The SARS-CoV-2  RNA is generally detectable in upper and lower respiratory specimens during the acute phase of infection. The lowest concentration of SARS-CoV-2 viral copies this assay can detect is 250 copies / mL. A negative result does not preclude SARS-CoV-2 infection and should not be used as the sole basis for treatment or other patient management decisions.  A negative result may occur with improper specimen collection / handling, submission of specimen other than nasopharyngeal swab, presence of viral mutation(s) within the areas targeted by this assay, and inadequate number of viral copies (<  250 copies / mL). A negative result must be combined with clinical observations, patient history, and epidemiological information.  Fact Sheet for Patients:   StrictlyIdeas.no  Fact Sheet for Healthcare Providers: BankingDealers.co.za  This test is not yet approved or  cleared by the [place] FDA and has been authorized for detection and/or diagnosis of SARS-CoV-2 by FDA under an Emergency Use Authorization (EUA).  This EUA will remain in effect (meaning this test can be used) for the duration of the COVID-19 declaration under Section 564(b)(1) of the Act, 21 U.S.C. section 360bbb-3(b)(1), unless the authorization is terminated or revoked sooner.  Performed at [facility], [place] [address] [place], [place] [address]   Blood culture (routine x 2)     Status: None (Preliminary result)   Collection Time: 09/28/19 12:30 PM   Specimen: BLOOD  Result Value Ref Range Status   Specimen Description   Final    BLOOD RIGHT ANTECUBITAL Performed at [facility], [place] [address], [place], [place] [address]    Special Requests   Final    BOTTLES DRAWN AEROBIC AND ANAEROBIC Blood Culture adequate volume Performed at [place] [address] [place], [place] [address]    Culture   Final    NO GROWTH 3  DAYS Performed at [facility], [place] [address] [place], [place] [address]    Report Status PENDING  Incomplete         Radiology Studies: DG Abd 1 View  Result Date: 09/30/2019 CLINICAL DATA:  Partial small bowel obstruction EXAM: ABDOMEN - 1 VIEW COMPARISON:  01/09/2019 FINDINGS: Extreme right flank is excluded from view. Normal abdominal gas pattern. No gross free intraperitoneal gas. TrapEase inferior vena cava filter and bilateral iliac stents are noted. Left hip ORIF with significant left hip heterotopic ossification noted. IMPRESSION: Normal abdominal gas pattern. Electronically Signed   By: Fidela Salisbury MD   On: 09/30/2019 04:48        Scheduled Meds:  enoxaparin (LOVENOX) injection  40 mg Subcutaneous Q24H   metoprolol tartrate  5 mg Intravenous Q12H   mupirocin ointment   Topical Daily   sodium chloride flush  3 mL Intravenous Q12H   Continuous Infusions:  azithromycin Stopped (10/01/19 0038)   cefTRIAXone (ROCEPHIN)  IV 1 g (09/30/19 1226)   dextrose 5 % and 0.9% NaCl 100 mL/hr at 10/01/19 1014     LOS: 3 days     Georgette Shell, MD 10/01/2019, 1:13 PM

## 2019-10-01 NOTE — Progress Notes (Signed)
Patient stated that he wanted to go back home to [place] where his wife lives. Wife called and stated that she wants to fly down to pick him up. Wife is Shaquon Gropp and her number is [phone] and she would like to be called before her husband is placed in a facility or discharged home.

## 2019-10-01 NOTE — Care Management Important Message (Signed)
Important Message  Patient Details IM Letter given to the Patient Name: [name] [name] MRN: [id number] Date of Birth: [birth year]   Medicare Important Message Given:  Yes     [name] 10/01/2019, 10:09 AM

## 2019-10-01 NOTE — Progress Notes (Signed)
PT Cancellation Note  Patient Details Name: [name] [name] MRN: [id number] DOB: May 14, 1945   Cancelled Treatment:    Reason Eval/Treat Not Completed: PT screened, no needs identified, will sign off. Pt is LTC SNF resident that is dependent for mobility (requires hoyer lift). Pt is not appropriate for acute PT services. Recommend return to SNF and defer PT eval to SNF if warranted.    [facility]  Office: [phone] Pager: [phone]

## 2019-10-01 NOTE — Progress Notes (Addendum)
    CC: Leaking G-tube site  Subjective:  There seems to be some confusion over his diet.  He has clear liquids ordered, recommendation is for D3.  Minimal drainage from his G-tube site.    We removed the suture from his midline that was sticking through the skin.  Objective: Vital signs in last 24 hours: Temp:  [97.6 F (36.4 C)-98.9 F (37.2 C)] 97.7 F (36.5 C) (09/21 0527) Pulse Rate:  [45-62] 50 (09/21 0527) Resp:  [13-23] 14 (09/21 0527) BP: (126-155)/(56-82) 155/71 (09/21 0527) SpO2:  [91 %-100 %] 91 % (09/21 0527) Last BM Date: 10/01/19 Nothing p.o. recorded 3826 IV recorded Urine 900 BM x1 Afebrile vital signs are stable; heart rate 40s to 50s range NA 149, potassium 3.2 creatinine 1.30, LFTs normal WBC 8.7 H/H 12.5/39.3 Platelets 340,000 Plan abdominal film yesterday shows normal abdominal gas pattern.  Intake/Output from previous day: 09/20 0701 - 09/21 0700 In: 3826.3 [I.V.:3476.3; IV Piggyback:350] Out: 900 [Urine:900] Intake/Output this shift: No intake/output data recorded.  General appearance: alert, cooperative and no distress GI: soft, non-tender; bowel sounds normal; no masses,  no organomegaly and G-tube site with minimal drainage, midline suture removed  Lab Results:  Recent Labs    09/29/19 0427 09/30/19 0609  WBC 22.7* 8.7  HGB 13.9 12.5*  HCT 43.6 39.3  PLT 359 340    BMET Recent Labs    09/29/19 0427 09/30/19 0609  NA 145 149*  K 3.6 3.2*  CL 106 111  CO2 28 28  GLUCOSE 78 101*  BUN 25* 20  CREATININE 1.50* 1.30*  CALCIUM 9.5 10.0   PT/INR No results for input(s): LABPROT, INR in the last 72 hours.  Recent Labs  Lab 09/28/19 1230 09/30/19 0609  AST 20 14*  ALT 24 16  ALKPHOS 135* 86  BILITOT 1.0 0.8  PROT 7.3 5.6*  ALBUMIN 3.3* 2.5*     Lipase     Component Value Date/Time   LIPASE 22 09/28/2019 1230     Medications: . enoxaparin (LOVENOX) injection  40 mg Subcutaneous Q24H  . metoprolol tartrate  5 mg  Intravenous Q12H  . mupirocin ointment   Topical Daily  . sodium chloride flush  3 mL Intravenous Q12H    Assessment/Plan Hospitalized 11/15-12/10/2015 with multiple embolic CVAs, SBO, exploratory laparotomy/lysis of adhesions, severe dysphagia, home on TNA Hx CVA with left-sided hemiplegia Dysphagia with PEG tube recently removed Aspiration pneumonia versus aspiration pneumonitis AKI Hx compensated systolic heart failure/CAD Hypertension Type 2 diabetes Hx cholelithiasis Hx right subclavian stent Hypokalemia Hypernatremia  Nausea/vomiting -possible PSBO PEG tube leakage  - correct electrolytes, K+ 4.0 range  FEN: Clear liquids/IV fluids ID: Ceftriaxone 9/18 >> day 4 azithromycin 9/18 >> day 4 DVT: Lovenox Follow-up: TBD  Plan: There is no clinical evidence for PSBO currently.  Defer diet to Dr. Zigmund [name] [name].    Continue local wound care.  If his swallowing allows adequate diet there is no need for replacing the G-tube.  Continue local wound care to the G-tube site and previous suture site.    He had a bowel movement yesterday which was recorded.   LOS: 3 days   [name] [name],[name] 10/01/2019 Please see Amion  Agree with above. He's eating regular food without difficulty.  No complaints.  [name] Overall, MD, [facility] Office phone:  [phone]

## 2019-10-02 LAB — COMPREHENSIVE METABOLIC PANEL
ALT: 14 U/L (ref 0–44)
AST: 15 U/L (ref 15–41)
Albumin: 2.4 g/dL — ABNORMAL LOW (ref 3.5–5.0)
Alkaline Phosphatase: 75 U/L (ref 38–126)
Anion gap: 7 (ref 5–15)
BUN: 12 mg/dL (ref 8–23)
CO2: 23 mmol/L (ref 22–32)
Calcium: 9.4 mg/dL (ref 8.9–10.3)
Chloride: 115 mmol/L — ABNORMAL HIGH (ref 98–111)
Creatinine, Ser: 1.13 mg/dL (ref 0.61–1.24)
GFR calc Af Amer: >60 mL/min (ref >60)
GFR calc non Af Amer: >60 mL/min (ref >60)
Glucose, Bld: 84 mg/dL (ref 70–99)
Potassium: 3.4 mmol/L — ABNORMAL LOW (ref 3.5–5.1)
Sodium: 145 mmol/L (ref 135–145)
Total Bilirubin: 0.6 mg/dL (ref 0.3–1.2)
Total Protein: 5.3 g/dL — ABNORMAL LOW (ref 6.5–8.1)

## 2019-10-02 LAB — CBC
HCT: 40.1 % (ref 39.0–52.0)
Hemoglobin: 12.7 g/dL — ABNORMAL LOW (ref 13.0–17.0)
MCH: 25.9 pg — ABNORMAL LOW (ref 26.0–34.0)
MCHC: 31.7 g/dL (ref 30.0–36.0)
MCV: 81.7 fL (ref 80.0–100.0)
Platelets: 309 10*3/uL (ref 150–400)
RBC: 4.91 MIL/uL (ref 4.22–5.81)
RDW: 15 % (ref 11.5–15.5)
WBC: 6.3 10*3/uL (ref 4.0–10.5)
nRBC: 0 % (ref 0.0–0.2)

## 2019-10-02 NOTE — TOC Progression Note (Signed)
Transition of Care [facility]) - Progression Note    Patient Details  Name: [name] [name] MRN: [id number] Date of Birth: Dec 13, 1945  [facility] CM/SW Contact  Purcell Mouton, RN Phone Number: 10/02/2019, 10:45 AM  Clinical Narrative:    Spoke with pt's wife concerning pt being transferred back to [facility]. Mrs. Condie was okay with pt returning to [facility].         Expected Discharge Plan and Services           Expected Discharge Date: 10/02/19                                     Social Determinants of Health (SDOH) Interventions    Readmission Risk Interventions No flowsheet data found.

## 2019-10-02 NOTE — TOC Progression Note (Signed)
Transition of Care [facility]) - Progression Note    Patient Details  Name: [name] [name] MRN: [id number] Date of Birth: Apr 30, 1945  Transition of Care [facility]) CM/SW Contact  Purcell Mouton, RN Phone Number: 10/02/2019, 11:11 AM  Clinical Narrative:    [facility] was called.  RN is aware.         Expected Discharge Plan and Services           Expected Discharge Date: 10/02/19                                     Social Determinants of Health (SDOH) Interventions    Readmission Risk Interventions No flowsheet data found.

## 2019-10-02 NOTE — Discharge Summary (Signed)
Physician Discharge Summary  Chivas Notz TSV:[id number] DOB: September 10, 1945 DOA: 09/28/2019  PCP: Patient, No Pcp Per  Admit date: 09/28/2019 Discharge date: 10/02/2019  Admitted From: Nursing home Disposition: Nursing home Recommendations for Outpatient Follow-up:  1. Follow up with PCP in 1-2 weeks 2. Please obtain BMP/CBC in one week 3. Please check BMP 10/03/2019 his potassium was 3.4 on discharge.   Home Health: None Equipment/Devices: None Discharge Condition: Stable CODE STATUS: DNR Diet recommendation: Cardiac Brief/Interim Summary:74 year old male with a past medical history of CVA with left-sided hemiplegia,left distal femur fracture without surgical intervention,HFrEF (last EF 2025% in 2019) type 2 diabetes, atrial fibrillation, prior bowel obstruction, dysphagia with chronic PEG tube, hypertension, who follows with palliative care([facility])who presented to the ED with multiple complaints.Patient complained of abdominal pain thatisintermittent, nausea, vomiting, decreased flatus, no bowel movements as well as chronic cough with apparentlyrecently diagnosed pneumonia.He was however not on antibiotic. There is also history of PEG tube falling out 2 weeks back with drainage of greenish fluid and poor oral intolerance. There was concern that the patient was not moving his bowel.In the ED patient was afebrile. Notable labs: K3.0, bicarb 35, glucose 115, BUN 28, creatinine 1.78 (previously 1.3 in 2019), lactic acid 2.0, WBC 11.4, COVID-19 negative. CXR with overall improved appearance from 2019 with opacity at the left lung base potentially representing a chronic scarring/atelectasis versus persistent/recurrent pleural fluid. CT abdomen pelvis with contrast: Groundglass and consolidative opacities in the left lower lobe reflecting pneumonia or aspiration, single dilated loop of jejunum posterior to the stomach that is unchanged from priors, multiple indeterminate left renal  lesions which have increased since prior study.He was given fluids, ceftriaxone and azithromycin and general surgery was consulted for partial SBO as well as loss of PEG tube.   Discharge Diagnoses:  Principal Problem:   SBO (small bowel obstruction) (HCC) Active Problems:   Chronic systolic CHF (congestive heart failure) (HCC)   At risk for aspiration   AKI (acute kidney injury) ([place])   PEG tube malfunction (HCC)   Atrial fibrillation, chronic (HCC)   Hypokalemia   Pressure injury of skin  Nausea and vomiting concern for possible partial SBO-resolved.patient was seen by speech therapy and was started on dysphagia 3 diet which he did well.  He was also treated with IV fluids.    PEG tube leakage with concern for partial SBO-continue dysphagia 3 diet.  Patient was seen in consultation by general surgery.  There was 1 suture left behind which was taken off during this admission.  Continue to monitor the PICC site area for any infection.  Since he tolerated p.o. intake PEG tube was not replaced.   Aspiration pneumonia versus aspiration pneumonitis-he was treated with IV antibiotics during the hospital stay.  He will not be discharged  on any antibiotics. .  AKI on CKD 3a. Creatinine on the day of discharge is 1.13.   Hypokalemia.  Potassium 3.2 repleted  Dysphagia with chronic PEG tubewhich has spontaneously been removed x 2 weeks  Compensated systolic heart failure. Continue home meds.  Entresto spironolactone  and Lasix.  Chronic atrial fibrillation, rate controlled. Continue amiodarone.  Coreg has been stopped due to bradycardia..  Essential hypertension.  Continue home meds.    History of CVA with left-sided hemiplegia-supportive treatment  Pressure injury of the elbow stage I distal tibia on the left stage III.Present on admission. Continue wound care.   Pressure Injury 09/28/19 Elbow Left;Posterior Stage 1 -  Intact skin with non-blanchable redness of a  localized  area usually over a bony prominence. (Active)  09/28/19 1830  Location: Elbow  Location Orientation: Left;Posterior  Staging: Stage 1 -  Intact skin with non-blanchable redness of a localized area usually over a bony prominence.  Wound Description (Comments):   Present on Admission: Yes     Pressure Injury Tibial Distal;Left;Posterior;Lateral Stage 3 -  Full thickness tissue loss. Subcutaneous fat may be visible but bone, tendon or muscle are NOT exposed. (Active)     Location: Tibial  Location Orientation: Distal;Left;Posterior;Lateral  Staging: Stage 3 -  Full thickness tissue loss. Subcutaneous fat may be visible but bone, tendon or muscle are NOT exposed.  Wound Description (Comments):   Present on Admission: Yes     Pressure Injury 09/28/19 Tibial Distal;Posterior;Right;Lateral Stage 3 -  Full thickness tissue loss. Subcutaneous fat may be visible but bone, tendon or muscle are NOT exposed. (Active)  09/28/19 1832  Location: Tibial  Location Orientation: Distal;Posterior;Right;Lateral  Staging: Stage 3 -  Full thickness tissue loss. Subcutaneous fat may be visible but bone, tendon or muscle are NOT exposed.  Wound Description (Comments):   Present on Admission:      Pressure Injury 09/29/19 Coccyx Mid;Right;Left Stage 1 -  Intact skin with non-blanchable redness of a localized area usually over a bony prominence. Stage 1, with healed ulcers noted (Active)  09/29/19 0800  Location: Coccyx  Location Orientation: Mid;Right;Left  Staging: Stage 1 -  Intact skin with non-blanchable redness of a localized area usually over a bony prominence.  Wound Description (Comments): Stage 1, with healed ulcers noted  Present on Admission: Yes    Estimated body mass index is 26.07 kg/m as calculated from the following:   Height as of this encounter: 5\' 11"  (1.803 m).   Weight as of this encounter: 84.8 kg.  Discharge Instructions  Discharge Instructions    Diet - low sodium  heart healthy   Complete by: As directed    Discharge wound care:   Complete by: As directed    Wound care  Daily      Comments: Cleanse wounds to bilateral lower legs with NS and pat dry. Apply mupirocin ointment and cover with silicone foam.  Reapply daily and change foam every three days.  09/30/19 1228    09/30/19 1100   Wound care  Every shift      Comments: Clean gastrostomy site and suture site with soap and water BID, redress with dry dressing, and antibiotic ointment over each site.   Increase activity slowly   Complete by: As directed      Allergies as of 10/02/2019   No Known Allergies     Medication List    STOP taking these medications   carvedilol 6.25 MG tablet Commonly known as: COREG   cephALEXin 500 MG capsule Commonly known as: KEFLEX   spironolactone 25 MG tablet Commonly known as: ALDACTONE     TAKE these medications   acetaminophen 325 MG tablet Commonly known as: TYLENOL Place 650 mg into feeding tube every 6 (six) hours as needed for mild pain.   amiodarone 200 MG tablet Commonly known as: PACERONE Take 200 mg by mouth daily.   aspirin 81 MG chewable tablet Chew 81 mg by mouth daily.   atorvastatin 40 MG tablet Commonly known as: LIPITOR Take 40 mg by mouth at bedtime.   esomeprazole 20 MG capsule Commonly known as: NEXIUM Take 20 mg by mouth daily.   ferrous sulfate 325 (65 FE) MG EC tablet Take 325 mg  by mouth in the morning and at bedtime.   furosemide 20 MG tablet Commonly known as: LASIX Place 2 tablets (40 mg total) into feeding tube daily. What changed: Another medication with the same name was removed. Continue taking this medication, and follow the directions you see here.   gabapentin 400 MG capsule Commonly known as: NEURONTIN Take 400 mg by mouth 2 (two) times daily.   magnesium oxide 400 MG tablet Commonly known as: MAG-OX Take 400 mg by mouth 2 (two) times daily.   Phenergan 25 MG/ML injection Generic drug:  promethazine Inject 25 mg into the muscle daily as needed for nausea or vomiting.   promethazine 12.5 MG suppository Commonly known as: PHENERGAN Place 12.5 mg rectally every 6 (six) hours as needed for nausea or vomiting.   sacubitril-valsartan 24-26 MG Commonly known as: ENTRESTO Take 1 tablet by mouth 2 (two) times daily.   traMADol 50 MG tablet Commonly known as: ULTRAM Take 50 mg by mouth 3 (three) times daily as needed for moderate pain or severe pain.   vitamin C 500 MG tablet Commonly known as: ASCORBIC ACID Take 500 mg by mouth 2 (two) times daily.   Vitamin D-3 125 MCG (5000 UT) Tabs Take 1 tablet by mouth daily.            Discharge Care Instructions  (From admission, onward)         Start     Ordered   10/02/19 0000  Discharge wound care:       Comments: Wound care  Daily      Comments: Cleanse wounds to bilateral lower legs with NS and pat dry. Apply mupirocin ointment and cover with silicone foam.  Reapply daily and change foam every three days.  09/30/19 1228    09/30/19 1100   Wound care  Every shift      Comments: Clean gastrostomy site and suture site with soap and water BID, redress with dry dressing, and antibiotic ointment over each site.   10/02/19 1035          Contact information for after-discharge care    Destination    [facility] .   Service: Skilled Nursing Contact information: [address] [place] [phone]                 No Known Allergies  Consultations:  General surgery   Procedures/Studies: DG Abd 1 View  Result Date: 09/30/2019 CLINICAL DATA:  Partial small bowel obstruction EXAM: ABDOMEN - 1 VIEW COMPARISON:  01/09/2019 FINDINGS: Extreme right flank is excluded from view. Normal abdominal gas pattern. No gross free intraperitoneal gas. TrapEase inferior vena cava filter and bilateral iliac stents are noted. Left hip ORIF with significant left hip  heterotopic ossification noted. IMPRESSION: Normal abdominal gas pattern. Electronically Signed   By: Fidela Salisbury MD   On: 09/30/2019 04:48   CT ABDOMEN PELVIS W CONTRAST  Result Date: 09/28/2019 CLINICAL DATA:  Vomiting and severe abdominal pain EXAM: CT ABDOMEN AND PELVIS WITH CONTRAST TECHNIQUE: Multidetector CT imaging of the abdomen and pelvis was performed using the standard protocol following bolus administration of intravenous contrast. CONTRAST:  36mL OMNIPAQUE IOHEXOL 300 MG/ML  SOLN COMPARISON:  December 09, 2015 FINDINGS: Lower chest: Heterogeneous ground-glass and consolidative opacities most predominant in the LEFT lower lobe. Mild RIGHT basilar atelectasis. Multifocal endobronchial plugging and mild bronchial wall thickening. Cardiomegaly. Hepatobiliary: The gallbladder is filled with high-density material as well as multiple gallstones, similar  in comparison to prior. Portal vein is patent. Unchanged appearance of prominent intrahepatic biliary ducts. Pancreas: No pancreatic ductal dilation. Partial fatty replacement of the pancreas. No definitive peripancreatic inflammation. Spleen: Unchanged lobulated appearance of the spleen. Adrenals/Urinary Tract: Unchanged nodularity of the LEFT adrenal gland. Multiple bilateral renal cysts. Subcentimeter hypodense lesions are too small to accurately characterize. There has been increase in size of an indeterminate isodense LEFT renal lesion which measures 2.7 cm, previously 2.1 cm (series 2, image 28). Additional indeterminate lesion of the inner LEFT kidney is increased in size and measures 15 mm, previously 8 mm (series 2, image 38). A third indeterminate lesion of the inner superior kidney measures 14 mm, not previously visualized (series 2, image 30). The bladder is decompressed. There are several punctate calcifications along the LEFT posterior wall of the bladder. Stomach/Bowel: Stomach remains in close association with the anterior abdominal  wall. There is a loop of mildly dilated jejunum posterior to the stomach, unchanged in size in comparison to more remote priors. It measures up to 3.2 cm. Diverticulosis. The appendix is unremarkable. Moderate colonic stool burden most predominant in the rectum and RIGHT hemicolon. Vascular/Lymphatic: Severe atherosclerotic calcifications. IVC filter. Bilateral iliac stents. Reproductive: Prostate is present. Other: No free fluid.  No free air. Musculoskeletal: Chronic deformity of the LEFT proximal femur. Status post LEFT femoral intramedullary rod fixation. Osteopenia. L4 hemangioma. IMPRESSION: 1. Heterogeneous ground-glass and consolidative opacities most predominant in the LEFT lower lobe. This may reflect pneumonia or aspiration. 2. There is a single dilated loop of jejunum posterior to the stomach. This is unchanged in comparison to priors. Differential consideration includes early small-bowel obstruction versus chronic dilation. 3. Multiple indeterminate left renal lesions, all of which have increased in size in comparison to prior study. These could reflect complex cysts or solid masses. Further nonemergent evaluation with renal protocol CT or MRI is recommended. 4. Punctate calcifications along the posterior wall of the bladder may reflect bladder stones. Aortic Atherosclerosis (ICD10-I70.0). Electronically Signed   By: Valentino Saxon MD   On: 09/28/2019 13:52   DG Chest Portable 1 View  Result Date: 09/28/2019 CLINICAL DATA:  74 year old male with a history of cough and abdominal pain EXAM: PORTABLE CHEST 1 VIEW COMPARISON:  03/26/2017 FINDINGS: Cardiomediastinal silhouette unchanged in size and contour. No evidence of interlobular septal thickening. Coarsened interstitial markings of the lungs, similar to the prior. Improved opacity on the right compared to the prior plain film. Persisting opacity at the left lung base partially obscuring the left hemidiaphragm and left heart border. No  pneumothorax. Vascular stent system in the right subclavian vein. IMPRESSION: Overall improved appearance of the lungs compared to the plain film of 2019, with opacity at the left lung base potentially representing chronic scarring/atelectasis versus persistent/recurrent pleural fluid. Electronically Signed   By: Corrie Mckusick D.O.   On: 09/28/2019 12:24    (Echo, Carotid, EGD, Colonoscopy, ERCP)    Subjective:  He is awake and alert and he is anxious to get back to the nursing home Discharge Exam: Vitals:   10/02/19 0439 10/02/19 1021  BP: (!) 164/62 (!) 145/70  Pulse: (!) 57 63  Resp: 20   Temp:    SpO2: 95%    Vitals:   10/01/19 2003 10/01/19 2154 10/02/19 0439 10/02/19 1021  BP: (!) 158/61  (!) 164/62 (!) 145/70  Pulse: (!) 55  (!) 57 63  Resp:  16 20   Temp: 98.4 F (36.9 C)     TempSrc: Oral  SpO2: 95%  95%   Weight:      Height:        General: Pt is alert, awake, not in acute distress Cardiovascular: RRR, S1/S2 +, no rubs, no gallops Respiratory: CTA bilaterally, no wheezing, no rhonchi Abdominal: Soft, NT, ND, bowel sounds + Extremities: no edema, no cyanosis    The results of significant diagnostics from this hospitalization (including imaging, microbiology, ancillary and laboratory) are listed below for reference.     Microbiology: Recent Results (from the past 240 hour(s))  SARS Coronavirus 2 by RT PCR (hospital order, performed in [facility] hospital lab) Nasopharyngeal Nasopharyngeal Swab     Status: None   Collection Time: 09/28/19 11:55 AM   Specimen: Nasopharyngeal Swab  Result Value Ref Range Status   SARS Coronavirus 2 NEGATIVE NEGATIVE Final    Comment: (NOTE) SARS-CoV-2 target nucleic acids are NOT DETECTED.  The SARS-CoV-2 RNA is generally detectable in upper and lower respiratory specimens during the acute phase of infection. The lowest concentration of SARS-CoV-2 viral copies this assay can detect is 250 copies / mL. A negative result  does not preclude SARS-CoV-2 infection and should not be used as the sole basis for treatment or other patient management decisions.  A negative result may occur with improper specimen collection / handling, submission of specimen other than nasopharyngeal swab, presence of viral mutation(s) within the areas targeted by this assay, and inadequate number of viral copies (<250 copies / mL). A negative result must be combined with clinical observations, patient history, and epidemiological information.  Fact Sheet for Patients:   StrictlyIdeas.no  Fact Sheet for Healthcare Providers: BankingDealers.co.za  This test is not yet approved or  cleared by the [place] FDA and has been authorized for detection and/or diagnosis of SARS-CoV-2 by FDA under an Emergency Use Authorization (EUA).  This EUA will remain in effect (meaning this test can be used) for the duration of the COVID-19 declaration under Section 564(b)(1) of the Act, 21 U.S.C. section 360bbb-3(b)(1), unless the authorization is terminated or revoked sooner.  Performed at [facility], [place] [address], [place], [place] [address]   Blood culture (routine x 2)     Status: None (Preliminary result)   Collection Time: 09/28/19 12:30 PM   Specimen: BLOOD  Result Value Ref Range Status   Specimen Description   Final    BLOOD RIGHT ANTECUBITAL Performed at [facility], [place] [address] [place], [place] [address]    Special Requests   Final    BOTTLES DRAWN AEROBIC AND ANAEROBIC Blood Culture adequate volume Performed at [place] [name], [place], [place] [address]    Culture   Final    NO GROWTH 3 DAYS Performed at [facility], [place] [address] [place], [place] [address]    Report Status PENDING  Incomplete  SARS Coronavirus 2 by RT PCR (hospital order, performed in [facility] hospital lab) Nasopharyngeal  Nasopharyngeal Swab     Status: None   Collection Time: 10/01/19  4:00 PM   Specimen: Nasopharyngeal Swab  Result Value Ref Range Status   SARS Coronavirus 2 NEGATIVE NEGATIVE Final    Comment: (NOTE) SARS-CoV-2 target nucleic acids are NOT DETECTED.  The SARS-CoV-2 RNA is generally detectable in upper and lower respiratory specimens during the acute phase of infection. The lowest concentration of SARS-CoV-2 viral copies this assay can detect is 250 copies / mL. A negative result does not preclude SARS-CoV-2 infection and  should not be used as the sole basis for treatment or other patient management decisions.  A negative result may occur with improper specimen collection / handling, submission of specimen other than nasopharyngeal swab, presence of viral mutation(s) within the areas targeted by this assay, and inadequate number of viral copies (<250 copies / mL). A negative result must be combined with clinical observations, patient history, and epidemiological information.  Fact Sheet for Patients:   StrictlyIdeas.no  Fact Sheet for Healthcare Providers: BankingDealers.co.za  This test is not yet approved or  cleared by the [place] FDA and has been authorized for detection and/or diagnosis of SARS-CoV-2 by FDA under an Emergency Use Authorization (EUA).  This EUA will remain in effect (meaning this test can be used) for the duration of the COVID-19 declaration under Section 564(b)(1) of the Act, 21 U.S.C. section 360bbb-3(b)(1), unless the authorization is terminated or revoked sooner.  Performed at [facility], [place] [address], [place], [place] [address]      Labs: BNP (last 3 results) No results for input(s): BNP in the last 8760 hours. Basic Metabolic Panel: Recent Labs  Lab 09/28/19 1230 09/29/19 0427 09/30/19 0609 10/02/19 0549  NA 144 145 149* 145  K 3.0* 3.6 3.2* 3.4*  CL 98 106 111 115*   CO2 35* 28 28 23   GLUCOSE 115* 78 101* 84  BUN 28* 25* 20 12  CREATININE 1.78* 1.50* 1.30* 1.13  CALCIUM 10.3 9.5 10.0 9.4  MG  --   --  2.0  --    Liver Function Tests: Recent Labs  Lab 09/28/19 1230 09/30/19 0609 10/02/19 0549  AST 20 14* 15  ALT 24 16 14   ALKPHOS 135* 86 75  BILITOT 1.0 0.8 0.6  PROT 7.3 5.6* 5.3*  ALBUMIN 3.3* 2.5* 2.4*   Recent Labs  Lab 09/28/19 1230  LIPASE 22   No results for input(s): AMMONIA in the last 168 hours. CBC: Recent Labs  Lab 09/28/19 1230 09/29/19 0427 09/30/19 0609 10/02/19 0549  WBC 11.4* 22.7* 8.7 6.3  NEUTROABS 7.9*  --   --   --   HGB 16.0 13.9 12.5* 12.7*  HCT 50.0 43.6 39.3 40.1  MCV 81.0 81.0 80.5 81.7  PLT 473* 359 340 309   Cardiac Enzymes: No results for input(s): CKTOTAL, CKMB, CKMBINDEX, TROPONINI in the last 168 hours. BNP: Invalid input(s): POCBNP CBG: No results for input(s): GLUCAP in the last 168 hours. D-Dimer No results for input(s): DDIMER in the last 72 hours. Hgb A1c No results for input(s): HGBA1C in the last 72 hours. Lipid Profile No results for input(s): CHOL, HDL, LDLCALC, TRIG, CHOLHDL, LDLDIRECT in the last 72 hours. Thyroid function studies No results for input(s): TSH, T4TOTAL, T3FREE, THYROIDAB in the last 72 hours.  Invalid input(s): FREET3 Anemia work up No results for input(s): VITAMINB12, FOLATE, FERRITIN, TIBC, IRON, RETICCTPCT in the last 72 hours. Urinalysis    Component Value Date/Time   COLORURINE YELLOW 03/22/2017 0410   APPEARANCEUR CLEAR 03/22/2017 0410   LABSPEC 1.014 03/22/2017 0410   PHURINE 6.0 03/22/2017 0410   GLUCOSEU NEGATIVE 03/22/2017 0410   HGBUR NEGATIVE 03/22/2017 0410   BILIRUBINUR NEGATIVE 03/22/2017 0410   KETONESUR 5 (A) 03/22/2017 0410   PROTEINUR 100 (A) 03/22/2017 0410   NITRITE NEGATIVE 03/22/2017 0410   LEUKOCYTESUR NEGATIVE 03/22/2017 0410   Sepsis Labs Invalid input(s): PROCALCITONIN,  WBC,  LACTICIDVEN Microbiology Recent Results  (from the past 240 hour(s))  SARS Coronavirus 2 by RT PCR (  hospital order, performed in [facility] hospital lab) Nasopharyngeal Nasopharyngeal Swab     Status: None   Collection Time: 09/28/19 11:55 AM   Specimen: Nasopharyngeal Swab  Result Value Ref Range Status   SARS Coronavirus 2 NEGATIVE NEGATIVE Final    Comment: (NOTE) SARS-CoV-2 target nucleic acids are NOT DETECTED.  The SARS-CoV-2 RNA is generally detectable in upper and lower respiratory specimens during the acute phase of infection. The lowest concentration of SARS-CoV-2 viral copies this assay can detect is 250 copies / mL. A negative result does not preclude SARS-CoV-2 infection and should not be used as the sole basis for treatment or other patient management decisions.  A negative result may occur with improper specimen collection / handling, submission of specimen other than nasopharyngeal swab, presence of viral mutation(s) within the areas targeted by this assay, and inadequate number of viral copies (<250 copies / mL). A negative result must be combined with clinical observations, patient history, and epidemiological information.  Fact Sheet for Patients:   StrictlyIdeas.no  Fact Sheet for Healthcare Providers: BankingDealers.co.za  This test is not yet approved or  cleared by the [place] FDA and has been authorized for detection and/or diagnosis of SARS-CoV-2 by FDA under an Emergency Use Authorization (EUA).  This EUA will remain in effect (meaning this test can be used) for the duration of the COVID-19 declaration under Section 564(b)(1) of the Act, 21 U.S.C. section 360bbb-3(b)(1), unless the authorization is terminated or revoked sooner.  Performed at [facility], [place] [address] [place], [place] [address]   Blood culture (routine x 2)     Status: None (Preliminary result)   Collection Time: 09/28/19 12:30 PM   Specimen: BLOOD   Result Value Ref Range Status   Specimen Description   Final    BLOOD RIGHT ANTECUBITAL Performed at [facility], [place] [address], [place], [place] [address]    Special Requests   Final    BOTTLES DRAWN AEROBIC AND ANAEROBIC Blood Culture adequate volume Performed at [place] [address], [place], [place] [address]    Culture   Final    NO GROWTH 3 DAYS Performed at [facility], [place] [address] [place], [place] [address]    Report Status PENDING  Incomplete  SARS Coronavirus 2 by RT PCR (hospital order, performed in [facility] hospital lab) Nasopharyngeal Nasopharyngeal Swab     Status: None   Collection Time: 10/01/19  4:00 PM   Specimen: Nasopharyngeal Swab  Result Value Ref Range Status   SARS Coronavirus 2 NEGATIVE NEGATIVE Final    Comment: (NOTE) SARS-CoV-2 target nucleic acids are NOT DETECTED.  The SARS-CoV-2 RNA is generally detectable in upper and lower respiratory specimens during the acute phase of infection. The lowest concentration of SARS-CoV-2 viral copies this assay can detect is 250 copies / mL. A negative result does not preclude SARS-CoV-2 infection and should not be used as the sole basis for treatment or other patient management decisions.  A negative result may occur with improper specimen collection / handling, submission of specimen other than nasopharyngeal swab, presence of viral mutation(s) within the areas targeted by this assay, and inadequate number of viral copies (<250 copies / mL). A negative result must be combined with clinical observations, patient history, and epidemiological information.  Fact Sheet for Patients:   StrictlyIdeas.no  Fact Sheet for Healthcare Providers: BankingDealers.co.za  This test is not yet approved or  cleared by the [place]  FDA and has been authorized for detection and/or diagnosis of SARS-CoV-2 by FDA under an Emergency  Use Authorization (EUA).  This EUA will remain in effect (meaning this test can be used) for the duration of the COVID-19 declaration under Section 564(b)(1) of the Act, 21 U.S.C. section 360bbb-3(b)(1), unless the authorization is terminated or revoked sooner.  Performed at [facility], [place] [address], [place], [place] [address]      Time coordinating discharge:  39 minutes  SIGNED:   Georgette Shell, MD  [facility] 10/02/2019, 10:35 AM

## 2019-10-03 LAB — CULTURE, BLOOD (ROUTINE X 2)
Culture: NO GROWTH
Special Requests: ADEQUATE

## 2019-11-09 IMAGING — DX DG CHEST 1V PORT
1 series · 1 of 1 positions shown · non-contrast
Comparison: March 23, 2017

CLINICAL DATA: Heart failure.  Shortness of breath.

EXAM:
PORTABLE CHEST 1 VIEW

[chest]
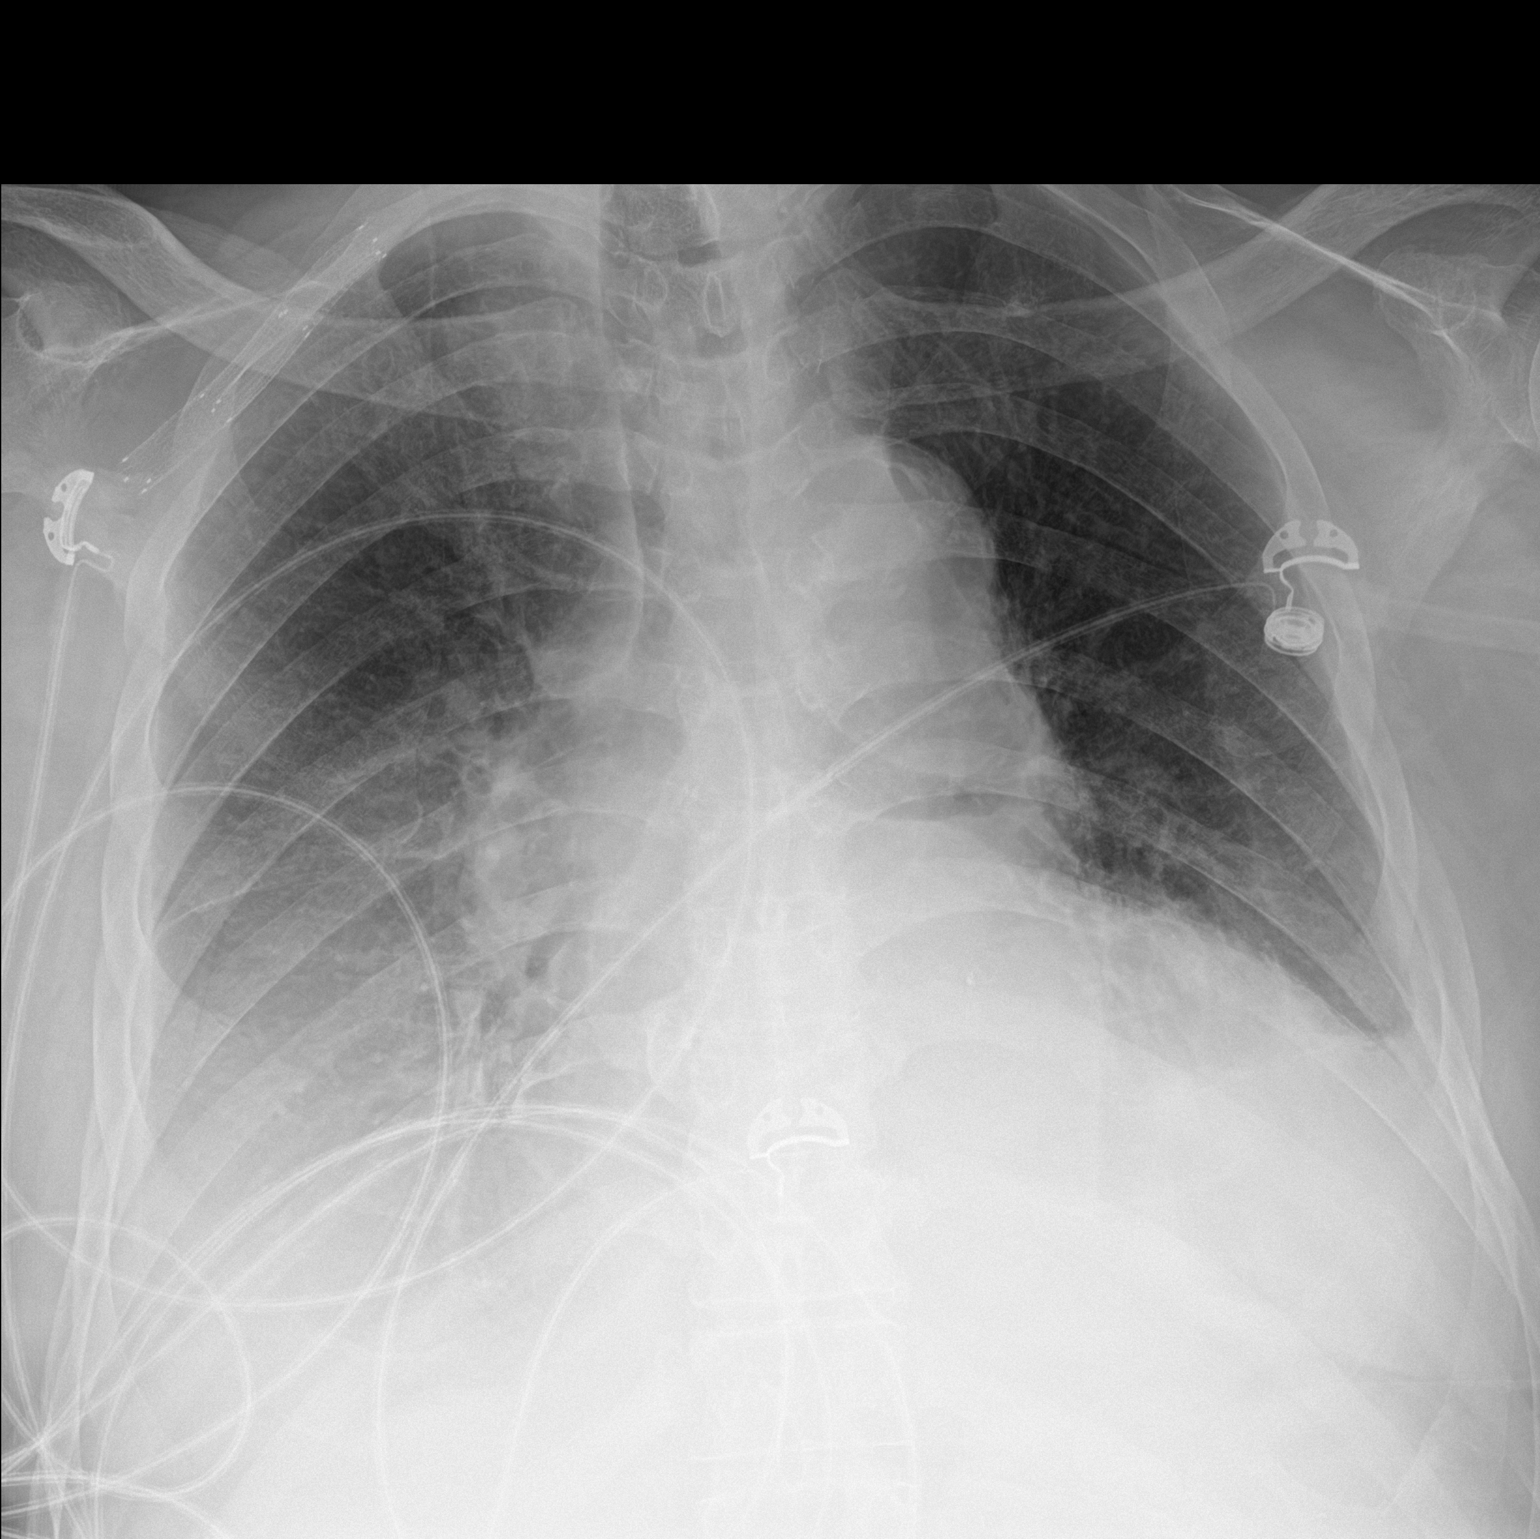

[1 of 1 positions shown; findings below may reference images not displayed]

FINDINGS: There is a possible 8 mm nodule in the left apex not seen in
December 2015. No other nodules. Bilateral effusions, right greater
than left, with underlying opacities. Suspected pulmonary venous
congestion. Stable cardiomegaly. The hila and mediastinum are
unchanged.
IMPRESSION: 1. Possible 8 mm nodule in the left apex, not seen in 6460.
Recommend attention to this region on a short-term follow-up after
the patient's symptoms resolved. If the finding persists, a CT scan
could further evaluate.
2. Right greater than left pleural effusions with underlying
atelectasis. Probable pulmonary venous congestion.

## 2019-11-18 ENCOUNTER — Non-Acute Institutional Stay: Payer: Medicare Other | Admitting: Hospice

## 2019-11-18 ENCOUNTER — Other Ambulatory Visit: Payer: Self-pay

## 2019-11-18 DIAGNOSIS — G8190 Hemiplegia, unspecified affecting unspecified side: Secondary | ICD-10-CM

## 2019-11-18 DIAGNOSIS — Z515 Encounter for palliative care: Secondary | ICD-10-CM

## 2019-11-18 DIAGNOSIS — I5022 Chronic systolic (congestive) heart failure: Secondary | ICD-10-CM

## 2019-11-18 NOTE — Progress Notes (Signed)
[facility] Consult Note Telephone: [phone]  Fax: [phone]  PATIENT NAME: [name] [name] DOB: September 30, 1945 MRN: [id number]  PRIMARY CARE PROVIDER:Amin, Marlene Lard, MD  [place], Marlene Lard, MD [place], [place] [address] [place] PARTY:Self Extended Emergency Contact Information Primary Emergency Contact: [name],[name] Address: [address] [place], [place] [address] [name] of [place] Phone: [phone] Relation: Daughter Spouse Axten Pascucci - out of town, [phone]    RECOMMENDATIONS/PLAN:   Visit consisted of building trust and discussions on Palliative Medicine as specialized medical care for people living with serious illness, aimed at facilitating better quality of life through symptoms relief, assisting with advance care plan and establishing goals of care.  Visit consisted of counseling and education dealing with the complex and emotionally intense issues of symptom management and palliative care in the setting of serious and potentially life-threatening illness. Palliative care team will continue to support patient, patient's family, and medical team.  Advance Care Planning/Code Status: Patient is a DO NOT RESUSCITATE  Goals of Care: Goals of care include to maximize quality of life and symptom management.  Follow up: Palliative care will continue to follow patient for goals of care clarification and symptom management. Follow up in 2 months/prn  Functional status/Symptom management:  Patient hospitalized 9/18-9/22/2021 for partial small bowel obstruction. Denies abdominal pain today, no nausea/vomiting; no report of constipation. He is in no distress.   Intake peg tube not replaced while in the hospital.  Patient continues on Regular diet, Level 6 - Soft & Bite Sized texture. No shortness of breath, no wheezing. He is compliant with his medications. Nursing  with no concerns. Palliative will continue to monitor for symptom management/decline and make recommendations as needed.  Family /Caregiver/Community Supports: Patient in SNF for ongoing care.  I spent  46 minutes providing this consultation; time includes time spent with patient/family, chart review, provider coordination,  and documentation. More than 50% of the time in this consultation was spent on coordinating communication  HISTORY OF PRESENT ILLNESS:[name] Jamesis a 74 y.o.year oldmalewith multiple medical problems including CVA,Dysphagia,CHF, DMT2. Palliative Care was asked to help address goals of care CODE STATUS:DNR  PPS:40%  HOSPICE ELIGIBILITY/DIAGNOSIS: TBD  PAST MEDICAL HISTORY:  Past Medical History:  Diagnosis Date  . Chronic systolic CHF (congestive heart failure) ([place])   . CVA, old, aphasia   . Diabetes ([place])   . Hemiplegia ([place])   . HTN (hypertension)   . Intestinal obstruction ([place]) 11/2015  . Peripheral vascular disease ([place])   . Pneumonia   . Stroke [facility])     SOCIAL HX:  Social History   Tobacco Use  . Smoking status: Former Research scientist (life sciences)  . Smokeless tobacco: Never Used  Substance Use Topics  . Alcohol use: No    ALLERGIES: No Known Allergies   PERTINENT MEDICATIONS:  Outpatient Encounter Medications as of 73/08/2019  Medication Sig  . acetaminophen (TYLENOL) 325 MG tablet Place 650 mg into feeding tube every 6 (six) hours as needed for mild pain.  Marland Kitchen amiodarone (PACERONE) 200 MG tablet Take 200 mg by mouth daily.   Marland Kitchen aspirin 81 MG chewable tablet Chew 81 mg by mouth daily.   Marland Kitchen atorvastatin (LIPITOR) 40 MG tablet Take 40 mg by mouth at bedtime.   . Cholecalciferol (VITAMIN D-3) 125 MCG (5000 UT) TABS Take 1 tablet by mouth daily.  Marland Kitchen esomeprazole (NEXIUM) 20 MG capsule Take 20 mg by mouth daily.  . ferrous sulfate 325 (  65 FE) MG EC tablet Take 325 mg by mouth in the morning and at bedtime.  . furosemide (LASIX) 20 MG tablet Place 2 tablets (40  mg total) into feeding tube daily. (Patient not taking: Reported on 09/28/2019)  . gabapentin (NEURONTIN) 400 MG capsule Take 400 mg by mouth 2 (two) times daily.  . magnesium oxide (MAG-OX) 400 MG tablet Take 400 mg by mouth 2 (two) times daily.   . promethazine (PHENERGAN) 12.5 MG suppository Place 12.5 mg rectally every 6 (six) hours as needed for nausea or vomiting.   . promethazine (PHENERGAN) 25 MG/ML injection Inject 25 mg into the muscle daily as needed for nausea or vomiting.  . sacubitril-valsartan (ENTRESTO) 24-26 MG Take 1 tablet by mouth 2 (two) times daily.  . traMADol (ULTRAM) 50 MG tablet Take 50 mg by mouth 3 (three) times daily as needed for moderate pain or severe pain.   . vitamin C (ASCORBIC ACID) 500 MG tablet Take 500 mg by mouth 2 (two) times daily.    No facility-administered encounter medications on file as of 73/08/2019.    PHYSICAL EXAM/ROS:   General: NAD,cooperative Cardiovascular: regular rate and rhythm; denies chest pain Pulmonary: clear ant fields, normal respiratory effort Abdomen: soft, nontender, + bowel sounds; no constipation GU: no suprapubic tenderness Skin: no rashesto exposed skin; mycotic toe nails, hair loss Neurological: Weakness but otherwise nonfocal Teodoro Spray, NP

## 2020-01-03 IMAGING — DX DG ABDOMEN 1V
1 series · 1 of 1 positions shown · non-contrast
Comparison: December 19, 2015

CLINICAL DATA: Evaluate PEG tube location

EXAM:
ABDOMEN - 1 VIEW

[abdomen kub]
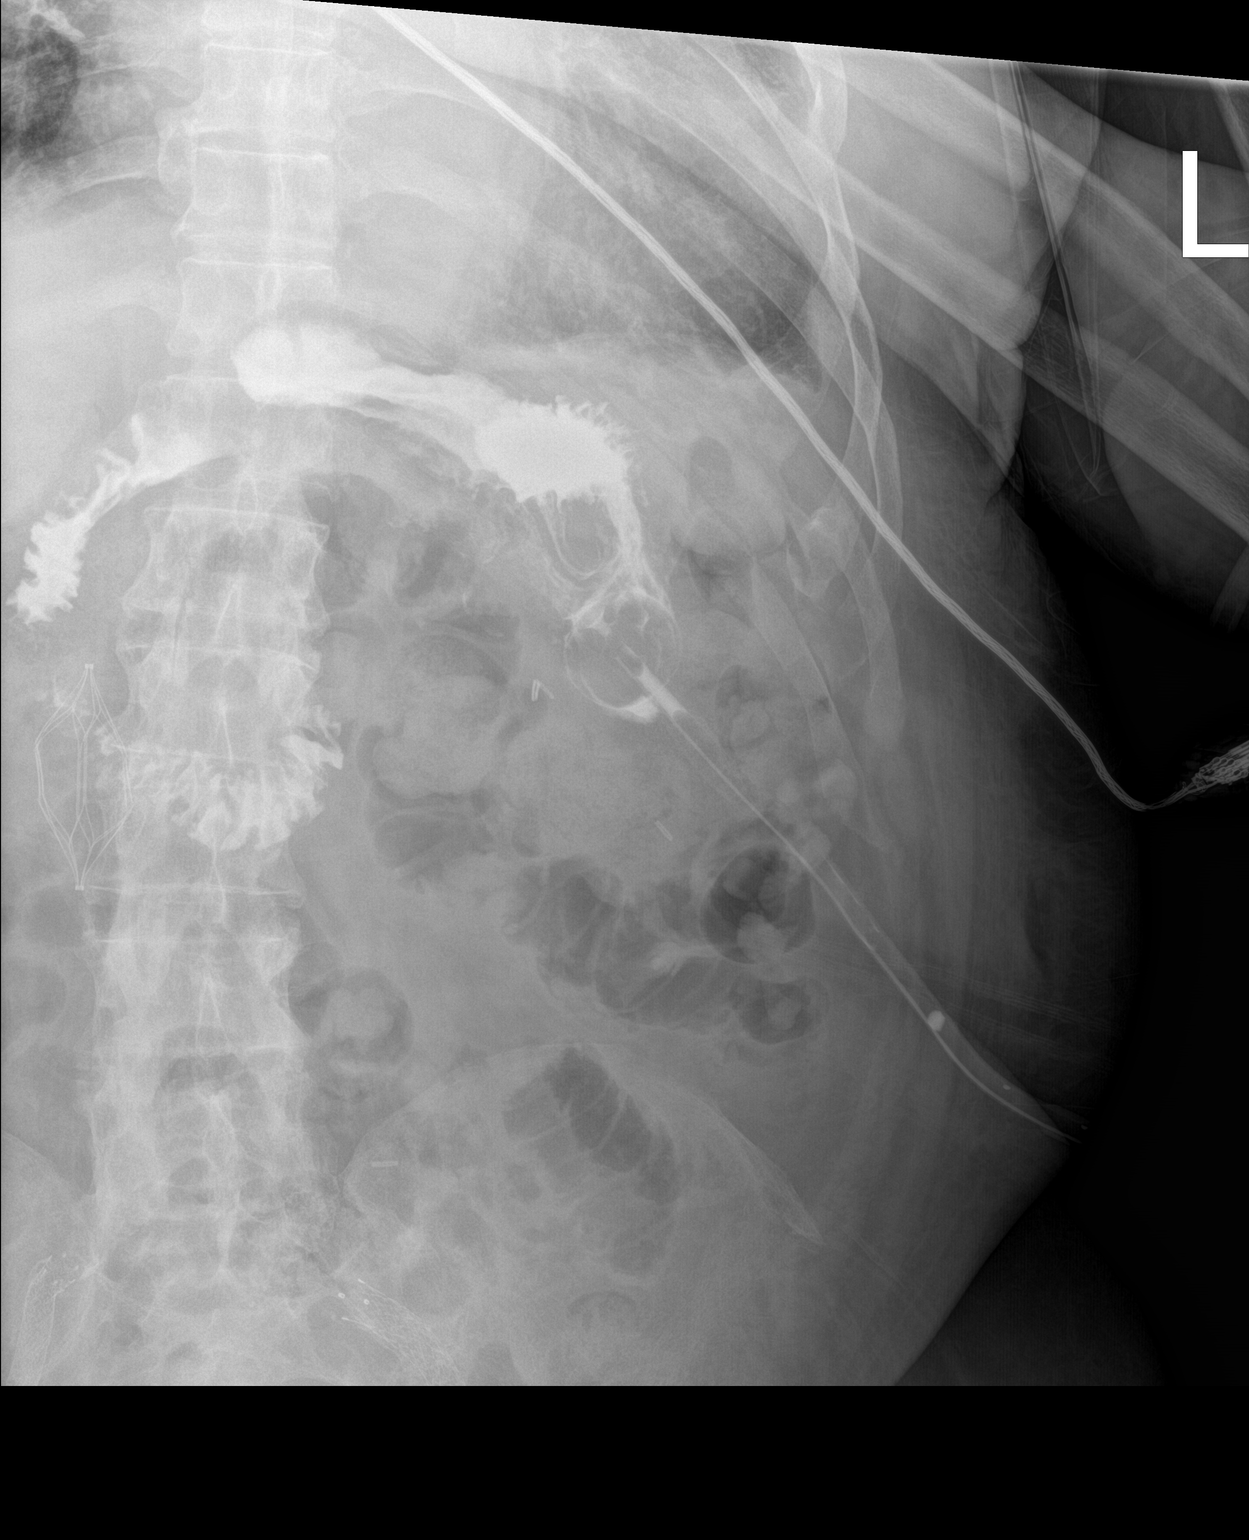

[1 of 1 positions shown; findings below may reference images not displayed]

FINDINGS: A PEG tube is identified in the left upper quadrant. Contrast
injected through the PEG tube appears to fill the stomach and
emptying into the duodenum with no evidence of leakage on this
study. Vascular stents in the iliac vessels. IVC filter identified.
No other acute abnormalities.
IMPRESSION: 1. Peg tube injection with filling of the stomach. No evidence of
leakage.

## 2020-02-21 ENCOUNTER — Non-Acute Institutional Stay: Payer: Medicare Other | Admitting: Hospice

## 2020-02-21 ENCOUNTER — Other Ambulatory Visit: Payer: Self-pay

## 2020-02-21 DIAGNOSIS — I63039 Cerebral infarction due to thrombosis of unspecified carotid artery: Secondary | ICD-10-CM

## 2020-02-21 DIAGNOSIS — Z515 Encounter for palliative care: Secondary | ICD-10-CM

## 2020-02-21 DIAGNOSIS — R1310 Dysphagia, unspecified: Secondary | ICD-10-CM

## 2020-02-21 NOTE — Progress Notes (Signed)
[facility] Consult Note Telephone: [phone]  Fax: [phone]  PATIENT NAME: [name] [name] DOB: 1945/07/31 MRN: [id number]  PRIMARY CARE PROVIDER:Amin, [name] Lard, [name]  [name] [name], [name] Lard, [name] [name] [name] [name] PARTY:Self Extended Emergency Contact Information Primary Emergency Contact: [name] [name] Address: [address] [name] [name], [name] [address] [name] [name] of [place] Phone: [phone] Relation: Daughter Spouse [name] [name] - out of town, [phone]   Chief Complain:Dyshagia  RECOMMENDATIONS/PLAN:  Visit consisted of building trust and discussions on Palliative Medicine as specialized medical care for people living with serious illness, aimed at facilitating better quality of life through symptoms relief, assisting with advance care plan and establishing goals of care.  Visit consisted of counseling and education dealing with the complex and emotionally intense issues of symptom management and palliative care in the setting of serious and potentially life-threatening illness. Palliative care team will continue to support patient, patient's family, and medical team.  Advance Care Planning/Code Status: Code status reviewed today, Patient is a DO NOT RESUSCITATE  Goals of Care: Goals of care include to maximize quality of life and symptom management. I spent 20 minutes providing this palliative consult.  More than 50% of the time was spent in counseling and coordinating communication. ------------------------------------------------------------------------------------------------------------------------------ Symptom management/plan:  Dysphagia: Continue regular diet, Level 6 - Soft & Bite Sized texture.  Aspiration precautions.  Monitor closely for signs of aspiration and downgrade diet as needed/ST consult. Palliative will continue to monitor for  symptom management/decline and make recommendations as needed.  Return in 6 weeks/as needed.  Encouraged to notify provider with any concerns.  Family /Caregiver/Community Supports:Patient in SNF for ongoing care.   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  Dysphagia is chronic, worse when patient tries to eat in the reclining position or distracted during swallowing.  Sitting up is helpful; small bites with sips of fluid is also helpful.  History ofCHF, type 2 diabetes mellitus. Palliative Care was asked to help address goals of care. History obtained from review of EMR, discussion with patient/family. Review and summarization of Epic records shows history from other than patient. Rest of 10 point ROS asked and negative.  Palliative Care was asked to follow this patient by consultation request of No ref. provider found to help address complex decision making in the context of goals of care.Marland Kitchen   CODE STATUS: DNR/DNI  PPS: 40%  HOSPICE ELIGIBILITY/DIAGNOSIS: TBD  PAST MEDICAL HISTORY:  Past Medical History:  Diagnosis Date  . Chronic systolic CHF (congestive heart failure) ([place])   . CVA, old, aphasia   . Diabetes ([place])   . Hemiplegia ([place])   . HTN (hypertension)   . Intestinal obstruction ([place]) 11/2015  . Peripheral vascular disease ([place])   . Pneumonia   . Stroke [facility])     SOCIAL HX: Patient in SN for ongoing care   FAMILY HX:  Family History  Problem Relation Age of Onset  . CAD Neg Hx     Review lab tests/diagnostics No results for input(s): WBC, HGB, HCT, PLT, MCV in the last 168 hours. No results for input(s): NA, K, CL, CO2, BUN, CREATININE, GLUCOSE in the last 168 hours. Latest GFR by Cockcroft Gault (not valid in AKI or ESRD) CrCl cannot be calculated (Patient's most recent lab result is older than the maximum 21 days allowed.). No results for  input(s): AST,  ALT, ALKPHOS, GGT in the last 168 hours.  Invalid input(s): TBILI, CONJBILI, ALB, TOTALPROTEIN No components found for: ALB No results for input(s): APTT, INR in the last 168 hours.  Invalid input(s): PTPATIENT No results for input(s): BNP, PROBNP in the last 168 hours.  ALLERGIES: No Known Allergies    PERTINENT MEDICATIONS:  Outpatient Encounter Medications as of 02/21/2020  Medication Sig  . acetaminophen (TYLENOL) 325 MG tablet Place 650 mg into feeding tube every 6 (six) hours as needed for mild pain.  Marland Kitchen amiodarone (PACERONE) 200 MG tablet Take 200 mg by mouth daily.   Marland Kitchen aspirin 81 MG chewable tablet Chew 81 mg by mouth daily.   Marland Kitchen atorvastatin (LIPITOR) 40 MG tablet Take 40 mg by mouth at bedtime.   . Cholecalciferol (VITAMIN D-3) 125 MCG (5000 UT) TABS Take 1 tablet by mouth daily.  Marland Kitchen esomeprazole (NEXIUM) 20 MG capsule Take 20 mg by mouth daily.  . ferrous sulfate 325 (65 FE) MG EC tablet Take 325 mg by mouth in the morning and at bedtime.  . furosemide (LASIX) 20 MG tablet Place 2 tablets (40 mg total) into feeding tube daily. (Patient not taking: Reported on 09/28/2019)  . gabapentin (NEURONTIN) 400 MG capsule Take 400 mg by mouth 2 (two) times daily.  . magnesium oxide (MAG-OX) 400 MG tablet Take 400 mg by mouth 2 (two) times daily.   . promethazine (PHENERGAN) 12.5 MG suppository Place 12.5 mg rectally every 6 (six) hours as needed for nausea or vomiting.   . promethazine (PHENERGAN) 25 MG/ML injection Inject 25 mg into the muscle daily as needed for nausea or vomiting.  . sacubitril-valsartan (ENTRESTO) 24-26 MG Take 1 tablet by mouth 2 (two) times daily.  . traMADol (ULTRAM) 50 MG tablet Take 50 mg by mouth 3 (three) times daily as needed for moderate pain or severe pain.   . vitamin C (ASCORBIC ACID) 500 MG tablet Take 500 mg by mouth 2 (two) times daily.    No facility-administered encounter medications on file as of 02/21/2020.    ROS  General: NAD EYES:  denies vision changes ENMT: Endorses difficulty swallowing when food is not soft enough, no xerostomia Cardiovascular: denies chest pain Pulmonary: denies  cough, denies SOB  Abdomen: endorses fair appetite, no constipation or diarrhea GU: denies dysuria or urinary frquency MSK:  endorses ROM limitations, no falls reported Skin: denies rashes or wounds Neurological: endorses weakness, denies pain, denies insomnia Psych: Endorses positive mood Heme/lymph/immuno: denies bruises, abnormal bleeding   PHYSICAL EXAM  General: In no acute distress,  Cardiovascular: regular rate and rhythm Pulmonary: no cough, no increased work of breathing, normal respiratory effort Abdomen: soft, non tender, positive bowel sounds in all quadrants GU:  no suprapubic tenderness Eyes: Normal lids, no discharge, sclera anicteric ENMT: Moist mucous membranes Musculoskeletal:  no edema in BLE Skin: no rash to visible skin, warm without cyanosis Psych: non-anxious affect Neurological: Weakness but otherwise non focal Heme/lymph/immuno: no bruises, no bleeding  Thank you for the opportunity to participate in the care of Elvie Maines Please call our office at [phone] if we can be of additional assistance.  Note: Portions of this note were generated with [employer]. Dictation errors may occur despite best attempts at proofreading.  Teodoro Spray, NP

## 2020-04-26 ENCOUNTER — Emergency Department (HOSPITAL_COMMUNITY): Payer: Medicare Other

## 2020-04-26 ENCOUNTER — Emergency Department (HOSPITAL_COMMUNITY)
Admission: EM | Admit: 2020-04-26 | Discharge: 2020-04-26 | Disposition: A | Discharge status: Home / Self Care | Payer: Medicare Other | Attending: Emergency Medicine | Admitting: Emergency Medicine

## 2020-04-26 ENCOUNTER — Other Ambulatory Visit: Payer: Self-pay

## 2020-04-26 ENCOUNTER — Encounter (HOSPITAL_COMMUNITY): Payer: Self-pay

## 2020-04-26 DIAGNOSIS — Z7982 Long term (current) use of aspirin: Secondary | ICD-10-CM | POA: Insufficient documentation

## 2020-04-26 DIAGNOSIS — K8020 Calculus of gallbladder without cholecystitis without obstruction: Secondary | ICD-10-CM | POA: Diagnosis not present

## 2020-04-26 DIAGNOSIS — R9089 Other abnormal findings on diagnostic imaging of central nervous system: Secondary | ICD-10-CM

## 2020-04-26 DIAGNOSIS — R112 Nausea with vomiting, unspecified: Secondary | ICD-10-CM | POA: Diagnosis not present

## 2020-04-26 DIAGNOSIS — H538 Other visual disturbances: Secondary | ICD-10-CM | POA: Insufficient documentation

## 2020-04-26 DIAGNOSIS — E1151 Type 2 diabetes mellitus with diabetic peripheral angiopathy without gangrene: Secondary | ICD-10-CM | POA: Insufficient documentation

## 2020-04-26 DIAGNOSIS — M7989 Other specified soft tissue disorders: Secondary | ICD-10-CM | POA: Diagnosis not present

## 2020-04-26 DIAGNOSIS — I110 Hypertensive heart disease with heart failure: Secondary | ICD-10-CM | POA: Insufficient documentation

## 2020-04-26 DIAGNOSIS — Z87891 Personal history of nicotine dependence: Secondary | ICD-10-CM | POA: Insufficient documentation

## 2020-04-26 DIAGNOSIS — K5730 Diverticulosis of large intestine without perforation or abscess without bleeding: Secondary | ICD-10-CM | POA: Insufficient documentation

## 2020-04-26 DIAGNOSIS — R9402 Abnormal brain scan: Secondary | ICD-10-CM | POA: Diagnosis not present

## 2020-04-26 DIAGNOSIS — R42 Dizziness and giddiness: Secondary | ICD-10-CM

## 2020-04-26 DIAGNOSIS — Z79899 Other long term (current) drug therapy: Secondary | ICD-10-CM | POA: Insufficient documentation

## 2020-04-26 DIAGNOSIS — I5022 Chronic systolic (congestive) heart failure: Secondary | ICD-10-CM | POA: Diagnosis not present

## 2020-04-26 DIAGNOSIS — I700 Atherosclerosis of aorta: Secondary | ICD-10-CM | POA: Insufficient documentation

## 2020-04-26 DIAGNOSIS — E119 Type 2 diabetes mellitus without complications: Secondary | ICD-10-CM | POA: Insufficient documentation

## 2020-04-26 LAB — COMPREHENSIVE METABOLIC PANEL
ALT: 13 U/L (ref 0–44)
AST: 14 U/L — ABNORMAL LOW (ref 15–41)
Albumin: 2.9 g/dL — ABNORMAL LOW (ref 3.5–5.0)
Alkaline Phosphatase: 104 U/L (ref 38–126)
Anion gap: 8 (ref 5–15)
BUN: 19 mg/dL (ref 8–23)
CO2: 25 mmol/L (ref 22–32)
Calcium: 10.2 mg/dL (ref 8.9–10.3)
Chloride: 108 mmol/L (ref 98–111)
Creatinine, Ser: 1.44 mg/dL — ABNORMAL HIGH (ref 0.61–1.24)
GFR, Estimated: 51 mL/min — ABNORMAL LOW (ref >60)
Glucose, Bld: 121 mg/dL — ABNORMAL HIGH (ref 70–99)
Potassium: 4.2 mmol/L (ref 3.5–5.1)
Sodium: 141 mmol/L (ref 135–145)
Total Bilirubin: 0.6 mg/dL (ref 0.3–1.2)
Total Protein: 6.6 g/dL (ref 6.5–8.1)

## 2020-04-26 LAB — CBC WITH DIFFERENTIAL/PLATELET
Abs Immature Granulocytes: 0.02 10*3/uL (ref 0.00–0.07)
Basophils Absolute: 0 10*3/uL (ref 0.0–0.1)
Basophils Relative: 0 %
Eosinophils Absolute: 0 10*3/uL (ref 0.0–0.5)
Eosinophils Relative: 0 %
HCT: 47.9 % (ref 39.0–52.0)
Hemoglobin: 15 g/dL (ref 13.0–17.0)
Immature Granulocytes: 0 %
Lymphocytes Relative: 23 %
Lymphs Abs: 1.6 10*3/uL (ref 0.7–4.0)
MCH: 26.1 pg (ref 26.0–34.0)
MCHC: 31.3 g/dL (ref 30.0–36.0)
MCV: 83.3 fL (ref 80.0–100.0)
Monocytes Absolute: 0.5 10*3/uL (ref 0.1–1.0)
Monocytes Relative: 7 %
Neutro Abs: 5 10*3/uL (ref 1.7–7.7)
Neutrophils Relative %: 70 %
Platelets: 260 10*3/uL (ref 150–400)
RBC: 5.75 MIL/uL (ref 4.22–5.81)
RDW: 16.7 % — ABNORMAL HIGH (ref 11.5–15.5)
WBC: 7.2 10*3/uL (ref 4.0–10.5)
nRBC: 0 % (ref 0.0–0.2)

## 2020-04-26 LAB — URINALYSIS, ROUTINE W REFLEX MICROSCOPIC
Bilirubin Urine: NEGATIVE
Glucose, UA: NEGATIVE mg/dL
Hgb urine dipstick: NEGATIVE
Ketones, ur: NEGATIVE mg/dL
Leukocytes,Ua: NEGATIVE
Nitrite: NEGATIVE
Protein, ur: NEGATIVE mg/dL
Specific Gravity, Urine: 1.013 (ref 1.005–1.030)
pH: 5 (ref 5.0–8.0)

## 2020-04-26 MED ORDER — IOHEXOL 300 MG/ML  SOLN
100.0000 mL | Freq: Once | INTRAMUSCULAR | Status: AC | PRN
  Administered 2020-04-26: 100 mL via INTRAVENOUS

## 2020-04-26 NOTE — ED Notes (Signed)
imaging

## 2020-04-26 NOTE — ED Provider Notes (Addendum)
[facility] EMERGENCY DEPARTMENT Provider Note   CSN: [id number] Arrival date & time: 04/26/20  1110     History Chief Complaint  Patient presents with  . Nausea    [name] [name] is a 75 y.o. male.  The history is provided by the patient and medical records.   [name] [name] is a 75 y.o. male who presents to the Emergency Department complaining of vomiting. He presents the emergency department by EMS from [facility] for evaluation of nausea and vomiting at that started this morning. He had two episodes of what he describes as significant emesis. No color description is available. He states that he started hiccuping prior to the emesis. At time of ED arrival he is asymptomatic. He denies any recent fevers, abdominal pain, chest pain, shortness of breath, dysuria, diarrhea. He has a history of CVA with left hemiplegia, hypertension, CHF, CKD    Past Medical History:  Diagnosis Date  . Chronic systolic CHF (congestive heart failure) ([place])   . CVA, old, aphasia   . Diabetes ([place])   . Hemiplegia ([place])   . HTN (hypertension)   . Intestinal obstruction ([place]) 11/2015  . Peripheral vascular disease ([place])   . Pneumonia   . Stroke [facility])     Patient Active Problem List   Diagnosis Date Noted  . Pressure injury of skin 09/29/2019  . SBO (small bowel obstruction) ([place]) 09/28/2019  . AKI (acute kidney injury) ([place]) 09/28/2019  . PEG tube malfunction ([place]) 09/28/2019  . Atrial fibrillation, chronic ([place]) 09/28/2019  . Hypokalemia 09/28/2019  . Sepsis ([place])   . At risk for aspiration   . Palliative care by specialist   . Goals of care, counseling/discussion   . Acute respiratory failure with hypoxia ([place]) 03/22/2017  . CVA, old, aphasia 03/22/2017  . Chronic systolic CHF (congestive heart failure) ([place]) 03/22/2017  . Mild renal insufficiency 03/22/2017  . AF (paroxysmal atrial fibrillation) ([place]) 03/22/2017    Past Surgical History:  Procedure Laterality  Date  . ABDOMINAL ADHESION SURGERY  11/2015  . PERIPHERAL VASCULAR CATHETERIZATION     R-EIA stent, possibly R-Subclavian stent 2017       Family History  Problem Relation Age of Onset  . CAD Neg Hx     Social History   Tobacco Use  . Smoking status: Former Research scientist (life sciences)  . Smokeless tobacco: Never Used  Vaping Use  . Vaping Use: Never used  Substance Use Topics  . Alcohol use: No  . Drug use: No    Home Medications Prior to Admission medications   Medication Sig Start Date End Date Taking? Authorizing Provider  acetaminophen (TYLENOL) 325 MG tablet Place 650 mg into feeding tube every 6 (six) hours as needed for mild pain.    [provider]  amiodarone (PACERONE) 200 MG tablet Take 200 mg by mouth daily.     [provider]  aspirin 81 MG chewable tablet Chew 81 mg by mouth daily.     [provider]  atorvastatin (LIPITOR) 40 MG tablet Take 40 mg by mouth at bedtime.     [provider]  Cholecalciferol (VITAMIN D-3) 125 MCG (5000 UT) TABS Take 1 tablet by mouth daily.    [provider]  esomeprazole (NEXIUM) 20 MG capsule Take 20 mg by mouth daily. 08/23/19   [provider]  ferrous sulfate 325 (65 FE) MG EC tablet Take 325 mg by mouth in the morning and at bedtime.    [provider]  furosemide (LASIX) 20 MG tablet Place 2 tablets (40 mg total) into feeding tube daily. Patient not taking: Reported on 09/28/2019 03/28/17   Regalado, Jerald Kief A, MD  gabapentin (NEURONTIN) 400 MG capsule Take 400 mg by mouth 2 (two) times daily.    [provider]  magnesium oxide (MAG-OX) 400 MG tablet Take 400 mg by mouth 2 (two) times daily.     [provider]  promethazine (PHENERGAN) 12.5 MG suppository Place 12.5 mg rectally every 6 (six) hours as needed for nausea or vomiting.  09/27/19   [provider]  promethazine (PHENERGAN) 25 MG/ML injection Inject 25 mg into the muscle daily as needed for nausea  or vomiting.    [provider]  sacubitril-valsartan (ENTRESTO) 24-26 MG Take 1 tablet by mouth 2 (two) times daily. 03/28/17   Regalado, Belkys A, MD  traMADol (ULTRAM) 50 MG tablet Take 50 mg by mouth 3 (three) times daily as needed for moderate pain or severe pain.  09/10/19   [provider]  vitamin C (ASCORBIC ACID) 500 MG tablet Take 500 mg by mouth 2 (two) times daily.     [provider]    Allergies    Patient has no known allergies.  Review of Systems   Review of Systems  All other systems reviewed and are negative.   Physical Exam Updated Vital Signs BP (!) 155/85   Pulse 66   Temp (!) 97.5 F (36.4 C) (Oral)   Resp 13   SpO2 94%   Physical Exam Vitals and nursing note reviewed.  Constitutional:      Appearance: He is well-developed.  HENT:     Head: Normocephalic and atraumatic.  Cardiovascular:     Rate and Rhythm: Normal rate and regular rhythm.     Heart sounds: No murmur heard.   Pulmonary:     Effort: Pulmonary effort is normal. No respiratory distress.     Breath sounds: Normal breath sounds.  Abdominal:     Palpations: Abdomen is soft.     Tenderness: There is no abdominal tenderness. There is no guarding or rebound.  Musculoskeletal:        General: No tenderness.     Comments: There is soft tissue swelling to the left foot without significant erythema. 2+ DP pulses bilaterally. The left lower extremity is contracted and atrophic. There is atrophied to the left hand.  Skin:    General: Skin is warm and dry.  Neurological:     Mental Status: He is alert and oriented to person, place, and time.     Comments: Pupils equal round and reactive to light, EOM I. There is left upper extremity weakness, which appears chronic in nature. There is left lower extremity paralysis. There is 3/5 strength to the right lower extremity, 4+ out of five strength to the right upper extremity. Visual fields are grossly intact.  Psychiatric:         Behavior: Behavior normal.     ED Results / Procedures / Treatments   Labs (all labs ordered are listed, but only abnormal results are displayed) Labs Reviewed  COMPREHENSIVE METABOLIC PANEL - Abnormal; Notable for the following components:      Result Value   Glucose, Bld 121 (*)    Creatinine, Ser 1.44 (*)    Albumin 2.9 (*)    AST 14 (*)    GFR, Estimated 51 (*)    All other components within normal limits  CBC WITH DIFFERENTIAL/PLATELET - Abnormal; Notable  for the following components:   RDW 16.7 (*)    All other components within normal limits  URINE CULTURE  URINALYSIS, ROUTINE W REFLEX MICROSCOPIC    EKG EKG Interpretation  Date/Time:  Sunday April 26 2020 11:58:47 EDT Ventricular Rate:  63 PR Interval:    QRS Duration: 127 QT Interval:  506 QTC Calculation: 518 R Axis:   62 Text Interpretation: Sinus rhythm Left bundle branch block Artifact Baseline wander Confirmed by Quintella Reichert [phone]) on 04/26/2020 12:19:30 PM   Radiology DG Skull 1-3 Views  Result Date: 04/26/2020 CLINICAL DATA:  MRI clearance EXAM: SKULL - 1-3 VIEW COMPARISON:  None. FINDINGS: Two view radiograph the calvarium demonstrates no retained metallic foreign body aside from dental fillings within the left maxillary and mandibular dentition. The paranasal sinuses are clear. IMPRESSION: No unexpected metallic intra-ocular or intracranial foreign body. Electronically Signed   By: Fidela Salisbury MD   On: 04/26/2020 19:51   DG Chest 1 View  Result Date: 04/26/2020 CLINICAL DATA:  Vomiting, dizziness EXAM: CHEST  1 VIEW COMPARISON:  04/26/2020 FINDINGS: Mild elevation of the right hemidiaphragm. Mild bilateral perihilar and lower lung zone interstitial infiltrate has developed since prior examination most in keeping with trace perihilar pulmonary edema, possibly cardiogenic in nature. No pneumothorax. Tiny left pleural effusion. Mild cardiomegaly is stable. Vascular stent noted within the right  axilla. No acute bone abnormality. IMPRESSION: Interval development of mild perihilar interstitial edema, possibly cardiogenic in nature. Stable cardiomegaly Electronically Signed   By: Fidela Salisbury MD   On: 04/26/2020 19:50   MR BRAIN WO CONTRAST  Result Date: 04/26/2020 CLINICAL DATA:  Dizziness, nonspecific. Additional history provided: Nausea and vomiting witnessed by family member. EXAM: MRI HEAD WITHOUT CONTRAST TECHNIQUE: Multiplanar, multiecho pulse sequences of the brain and surrounding structures were obtained without intravenous contrast. COMPARISON:  Prior head CT examinations 12/19/2015 and earlier. Prior brain MRI/MRA 12/15/2015. FINDINGS: Brain: Mild cerebral and cerebellar atrophy. Redemonstrated large chronic cortical/subcortical infarct within the paramedian right frontoparietal lobes (ACA vascular territory). Ex vacuo dilatation of the right lateral ventricle. Additional scattered small chronic cortically based infarcts within the bilateral frontal lobes, parietal bilateral lobes, right occipital lobe and right temporal lobe. Some of these infarcts were better appreciated on the prior brain MRI of 12/15/2015 (acute at that time). Chronic lacunar infarct within the right basal ganglia, also better appreciated on the prior brain MRI (acute at that time). Background mild multifocal T2/FLAIR hyperintensity within the cerebral white matter, nonspecific but compatible with chronic small vessel ischemic disease. Redemonstrated chronic lacunar infarct within the right pons (series 10, image 8) Redemonstrated chronic infarcts within the bilateral cerebellar hemispheres (large on the right). Chronic hemosiderin deposition associated with some of the chronic supratentorial and infratentorial infarcts. There is no acute infarct. No evidence of intracranial mass. No extra-axial fluid collection. No midline shift. Vascular: No definite loss of the expected proximal large arterial flow voids. Skull and  upper cervical spine: No focal marrow lesion. Incompletely assessed upper cervical spondylosis. A C3-C4 posterior disc osteophyte complex contributes to at least moderate spinal canal stenosis, contacting and mildly flattening the spinal cord at this level. Sinuses/Orbits: Visualized orbits show no acute finding. Small right maxillary sinus mucous retention cyst. IMPRESSION: No evidence of acute intracranial abnormality. Extensive chronic ischemia with multiple chronic infarcts in the supratentorial and infratentorial brain, as detailed within the body of the report. Many of these infarcts were acute at time of the prior brain MRI on 12/15/2015. Background mild generalized parenchymal  atrophy and cerebral white matter chronic small vessel ischemic disease. Incompletely assessed upper cervical spondylosis. Notably, a C3-C4 posterior disc osteophyte contributes to at least moderate spinal canal stenosis, contacting and mildly flattening the spinal cord at this level. Electronically Signed   By: Kellie Simmering DO   On: 04/26/2020 20:34   CT Abdomen Pelvis W Contrast  Result Date: 04/26/2020 CLINICAL DATA:  Abdominal distension, nausea, vomiting EXAM: CT ABDOMEN AND PELVIS WITH CONTRAST TECHNIQUE: Multidetector CT imaging of the abdomen and pelvis was performed using the standard protocol following bolus administration of intravenous contrast. CONTRAST:  156mL OMNIPAQUE IOHEXOL 300 MG/ML  SOLN COMPARISON:  09/28/2019 FINDINGS: Lower chest: Mild bibasilar scarring or atelectasis. Trace right pleural effusion. Mild cardiomegaly is stable. Advanced vascular calcifications are seen within the right coronary artery. No pericardial effusion. Hepatobiliary: Cholelithiasis without pericholecystic inflammatory change. Mild intra and extrahepatic biliary ductal dilation is stable, nonspecific. Liver is otherwise unremarkable. Pancreas: Unremarkable Spleen: Unremarkable Adrenals/Urinary Tract: The kidneys are normal in position.  There is mild bilateral renal cortical atrophy. 2 mm nonobstructing calculus is seen within the lower pole of the left kidney. Numerous cortical cystic lesions are identified bilaterally. While several of these are compatible with simple cysts, several of the larger lesions are relatively hyperdense, particularly within the left kidney and, while unchanged from prior examination, are indeterminate, possibly representing hyperdense renal cyst or cystic renal masses. No hydronephrosis. No ureteral calculi. The bladder is unremarkable. Stomach/Bowel: Mild sigmoid diverticulosis. The stomach, small bowel, and large bowel are otherwise unremarkable. Appendix normal. No free intraperitoneal gas or fluid. There is marked atrophy of the abdominal wall musculature and thinning of the subcutaneous fat anterior to the deep abdominal fascia anteriorly. Vascular/Lymphatic: Inferior vena cava filter is in expected position within the infrarenal cava. Mild aortoiliac atherosclerotic calcification. No aortic aneurysm. Vascular stents are noted within the common and external iliac arteries bilaterally. Postsurgical changes are seen within the left inguinal region. No pathologic adenopathy within the abdomen and pelvis. Reproductive: Prostate is unremarkable. Other: No abdominal wall hernia.  Rectum unremarkable. Musculoskeletal: Left hip ORIF has been performed. Extensive heterotopic calcifications surrounds the left hip. Advanced degenerative changes are noted within the hips bilaterally. Degenerative changes are seen within the lumbar spine. No acute bone abnormality. There is erosive changes involving the distal sacrum without overlying soft tissue defect at this time, possibly reflecting the sequela of remote decubitus ulceration or surgical resection. IMPRESSION: No acute intra-abdominal pathology identified. No radiographic explanation for the patient's reported symptoms. Cholelithiasis. Multiple intracortical cystic lesions  within the kidneys bilaterally. While stable since prior examination, several, proceeded within the left kidney, are indeterminate and may represent cystic renal neoplasm as well as hyperdense renal cysts. This could be assessed with dedicated renal mass protocol CT or MRI examination once the patient's acute issues have resolved. Mild sigmoid diverticulosis. Peripheral vascular disease. Erosive changes within the sacrum, possibly the sequela of remote intervention or decubitus ulceration. Aortic Atherosclerosis (ICD10-I70.0). Electronically Signed   By: Fidela Salisbury MD   On: 04/26/2020 19:48   DG Chest Port 1 View  Result Date: 04/26/2020 CLINICAL DATA:  Nausea, vomiting, chest pain. EXAM: PORTABLE CHEST 1 VIEW COMPARISON:  09/28/2019 FINDINGS: The heart is enlarged and stable in configuration. No focal consolidations or pleural effusions. No pulmonary edema. Vascular stent overlies the RIGHT clavicle, stable in appearance. IMPRESSION: Stable cardiomegaly. Electronically Signed   By: Nolon Nations M.D.   On: 04/26/2020 12:04    Procedures Procedures   Medications  Ordered in ED Medications  iohexol (OMNIPAQUE) 300 MG/ML solution 100 mL (100 mLs Intravenous Contrast Given 04/26/20 1848)    ED Course  I have reviewed the triage vital signs and the nursing notes.  Pertinent labs & imaging results that were available during my care of the patient were reviewed by me and considered in my medical decision making (see chart for details).    MDM Rules/Calculators/A&P                         patient with history of CVA here in the emergency department for episodes of emesis earlier today, currently resolved. On initial assessment patient denied any complaints but stated he had significant vomiting prior to ED arrival. He does have a history of recurrent bowel obstructions, plan to obtain CT abdomen pelvis.  On repeat assessment patient reports that he has been feeling intermittently dizzy with  blurred vision starting this morning. He currently does not feel dizzy. Narrow exam as above. Plan to obtain MRI to rule out acute CVA.  UA not consistent with UTI. BMP with stable renal insufficiency. CT scan does show progressive renal lesions that will warrant further outpatient workup. No evidence of bowel obstruction. On repeat assessment patient is asymptomatic, requesting something to eat in the department. Discussed with findings of studies with patient's son and daughter. Plan to discharge back to facility with outpatient follow-up and return precautions.  Final Clinical Impression(s) / ED Diagnoses Final diagnoses:  Abnormal brain MRI    Rx / DC Orders ED Discharge Orders    None       Quintella Reichert, MD 04/26/20 2104    Quintella Reichert, MD 04/26/20 2119

## 2020-04-26 NOTE — ED Notes (Signed)
Called [facility] per RN Request; patient next in line for pickup

## 2020-04-26 NOTE — ED Triage Notes (Signed)
Pt bib [facility]. Pt is here due to nausea & vomiting witnessed by a family member. Per family, pt vomited twice this morning.  bp 140/76  Temp 97.7  cbg 114

## 2020-04-26 NOTE — Discharge Instructions (Signed)
You had a chest x-ray performed today that she was some fluid on your lungs. Get rechecked if you develop difficulty breathing or new concerning symptoms. You also had a CT scan performed today that showed some cyst on your kidneys that will need to be evaluated further with a CT scan or MRI to evaluate for renal cell cancer.

## 2020-04-28 ENCOUNTER — Other Ambulatory Visit: Payer: Self-pay | Admitting: Emergency Medicine

## 2020-04-28 DIAGNOSIS — N23 Unspecified renal colic: Secondary | ICD-10-CM

## 2020-04-29 ENCOUNTER — Other Ambulatory Visit: Payer: Self-pay | Admitting: Emergency Medicine

## 2020-04-29 LAB — URINE CULTURE: Culture: <10000 — AB

## 2020-05-01 ENCOUNTER — Other Ambulatory Visit (HOSPITAL_COMMUNITY): Payer: Self-pay | Admitting: Emergency Medicine

## 2020-05-01 DIAGNOSIS — D49519 Neoplasm of unspecified behavior of unspecified kidney: Secondary | ICD-10-CM

## 2020-05-01 DIAGNOSIS — N23 Unspecified renal colic: Secondary | ICD-10-CM

## 2020-05-05 ENCOUNTER — Non-Acute Institutional Stay: Payer: Medicare Other | Admitting: Hospice

## 2020-05-05 ENCOUNTER — Other Ambulatory Visit: Payer: Self-pay

## 2020-05-05 DIAGNOSIS — R1310 Dysphagia, unspecified: Secondary | ICD-10-CM

## 2020-05-05 DIAGNOSIS — R112 Nausea with vomiting, unspecified: Secondary | ICD-10-CM

## 2020-05-05 DIAGNOSIS — Z515 Encounter for palliative care: Secondary | ICD-10-CM

## 2020-05-05 DIAGNOSIS — K5901 Slow transit constipation: Secondary | ICD-10-CM

## 2020-05-05 NOTE — Progress Notes (Signed)
[facility] Consult Note Telephone: [phone]  Fax: [phone]  PATIENT NAME: [name] [name] DOB: 1945/06/01 MRN: [id number]  PRIMARY CARE PROVIDER:   [name] NP  REFERRING PROVIDER: [name] Np  RESPONSIBLE PARTY:Self Extended Emergency Contact Information Primary Emergency Contact: [name] [name] Address: [address] [place], [place] [address] [name] [name] of [place] Phone: [phone] Relation: Daughter Spouse [name] [name] - out of town, [phone] Contact Information    Name Relation Home Work Mobile   [name] [name] Daughter [phone]     [name] [name] [phone]     [name] [name] [phone]          Visit is to build trust and highlight Palliative Medicine as specialized medical care for people living with serious illness, aimed at facilitating better quality of life through symptoms relief, assisting with advance care planning and complex medical decision making.   RECOMMENDATIONS/PLAN:   Advance Care Planning/Code Status: Reviewed. Patient affirmed he is a DNR/DNI  Goals of Care: Goals of care include to maximize quality of life and symptom management. Patient hopes to get up someday and write books. 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  Visit consisted of counseling and education dealing with the complex and emotionally intense issues of symptom management and palliative care in the setting of serious and potentially life-threatening illness. Palliative care team will continue to support patient, patient's family, and medical team.  I spent 16 minutes providing this consultation. More than 50% of the time in this consultation was spent on coordinating communication.  -------------------------------------------------------------------------------------------------------------------------------------------------- Symptom  management/Plan:  Nausea vomiting: Zofran 4 mg every 6 hour as needed for nausea/vomiting. KUB stat.  Monitor for dehydration; ensure adequate oral intake as patient is able to keep down at this time.  Routine CBC BMP.  Constipation: Patient reported last bowel movement was yesterday.  17 g MiraLAX in 4 to 6 ounces of choice fluid daily as needed for constipation Dysphagia: Continue regular diet, Level 6 - Soft & Bite Sized texture.  Aspiration precautions.  Monitor closely for signs of aspiration and downgrade diet as needed. ST consult as needed. Follow up: Palliative care will continue to follow for complex medical decision making, advance care planning, and clarification of goals. Return 6 weeks or prn. Encouraged to call provider sooner with any concerns.  CHIEF COMPLAINT: Palliative follow up visit/Nausea vomiting  HISTORY OF PRESENT ILLNESS:  [name] [name] a 75 y.o. male with multiple medical problems including acute  nausea/vomiting, ongoing for about 2 weeks, vomiting is intermittent,  is preceeded by hiccupping, sometimes making him not want to eat; he was seen in ED 04/26/2020 for nausea vomiting.  Last vomited this morning.  Patient said it is worse in the morning and evening; not sure what is helpful other than the medication he is taking for it.  Discussed KUB with his nurse who thinks it has already been ordered; he said he will get the order going if it has not already been ordered.  He has a history of CVA with left hemiplegia, hypertension, CHF, CKD.  History obtained from review of EMR, discussion with primary team, caregiver  and/or patient. Records reviewed and summarized above. All 10 point systems reviewed and are negative except as documented in history of present illness above  Review and summarization of Epic records shows history from other than patient.   Palliative Care was asked to follow this patient by consultation request of [name] Miller NP to  help address complex  decision making in the context of advance care planning and goals of care clarification.   CODE STATUS: DNR/DNI  PPS: 40%  HOSPICE ELIGIBILITY/DIAGNOSIS: TBD  PAST MEDICAL HISTORY:  Past Medical History:  Diagnosis Date  . Chronic systolic CHF (congestive heart failure) ([place])   . CVA, old, aphasia   . Diabetes ([place])   . Hemiplegia ([place])   . HTN (hypertension)   . Intestinal obstruction ([place]) 11/2015  . Peripheral vascular disease ([employer])   . Pneumonia   . Stroke [facility])      SOCIAL HX: @SOCX  Patient lives at [facility] for ongoing care  Family /Caregiver/Community Supports:   FAMILY HX:  Family History  Problem Relation Age of Onset  . CAD Neg Hx     Review lab tests/diagnostics Results for [name] [name] (MRN [id number]) as of 05/05/2020 11:40  Ref. Range 04/26/2020 11:21  WBC Latest Ref Range: 4.0 - 10.5 K/uL 7.2  RBC Latest Ref Range: 4.22 - 5.81 MIL/uL 5.75  Hemoglobin Latest Ref Range: 13.0 - 17.0 g/dL 15.0  HCT Latest Ref Range: 39.0 - 52.0 % 47.9  MCV Latest Ref Range: 80.0 - 100.0 fL 83.3  MCH Latest Ref Range: 26.0 - 34.0 pg 26.1  MCHC Latest Ref Range: 30.0 - 36.0 g/dL 31.3  RDW Latest Ref Range: 11.5 - 15.5 % 16.7 (H)  Platelets Latest Ref Range: 150 - 400 K/uL 260  nRBC Latest Ref Range: 0.0 - 0.2 % 0.0  Neutrophils Latest Units: % 70  Lymphocytes Latest Units: % 23  Monocytes Relative Latest Units: % 7  Eosinophil Latest Units: % 0  Basophil Latest Units: % 0  Immature Granulocytes Latest Units: % 0  NEUT# Latest Ref Range: 1.7 - 7.7 K/uL 5.0  Lymphocyte # Latest Ref Range: 0.7 - 4.0 K/uL 1.6  Monocyte # Latest Ref Range: 0.1 - 1.0 K/uL 0.5  Eosinophils Absolute Latest Ref Range: 0.0 - 0.5 K/uL 0.0    ALLERGIES: No Known Allergies    PERTINENT MEDICATIONS:  Outpatient Encounter Medications as of 05/05/2020  Medication Sig  . acetaminophen (TYLENOL) 325 MG tablet Place 650 mg into feeding tube every 6 (six) hours as needed for mild pain.  Marland Kitchen amiodarone  (PACERONE) 200 MG tablet Take 200 mg by mouth daily.   Marland Kitchen aspirin 81 MG chewable tablet Chew 81 mg by mouth daily.   Marland Kitchen atorvastatin (LIPITOR) 40 MG tablet Take 40 mg by mouth at bedtime.   . Cholecalciferol (VITAMIN D-3) 125 MCG (5000 UT) TABS Take 1 tablet by mouth daily.  Marland Kitchen esomeprazole (NEXIUM) 20 MG capsule Take 20 mg by mouth daily.  . ferrous sulfate 325 (65 FE) MG EC tablet Take 325 mg by mouth in the morning and at bedtime.  . furosemide (LASIX) 20 MG tablet Place 2 tablets (40 mg total) into feeding tube daily. (Patient not taking: Reported on 09/28/2019)  . gabapentin (NEURONTIN) 400 MG capsule Take 400 mg by mouth 2 (two) times daily.  . magnesium oxide (MAG-OX) 400 MG tablet Take 400 mg by mouth 2 (two) times daily.   . promethazine (PHENERGAN) 12.5 MG suppository Place 12.5 mg rectally every 6 (six) hours as needed for nausea or vomiting.   . promethazine (PHENERGAN) 25 MG/ML injection Inject 25 mg into the muscle daily as needed for nausea or vomiting.  . sacubitril-valsartan (ENTRESTO) 24-26 MG Take 1 tablet by mouth 2 (two) times daily.  . traMADol (ULTRAM) 50 MG tablet Take 50 mg by mouth 3 (three)  times daily as needed for moderate pain or severe pain.   . vitamin C (ASCORBIC ACID) 500 MG tablet Take 500 mg by mouth 2 (two) times daily.    No facility-administered encounter medications on file as of 05/05/2020.      ROS  General: NAD EYES: denies vision changes ENMT: Endorses difficulty swallowing when food is not soft enough, no xerostomia Cardiovascular: denies chest pain Pulmonary: denies cough, denies SOB  Abdomen: endorses fair appetite, no constipation or diarrhea, endorses nausea/vomiting GU: denies dysuria or urinary frequency MSK: endorses ROM limitations, no falls reported Skin: denies rashes or wounds Neurological: endorses weakness, denies pain, denies insomnia Psych: Endorses positive mood Heme/lymph/immuno: denies bruises, abnormal  bleeding   PHYSICAL EXAM  General: In no acute distress,  Cardiovascular: regular rate and rhythm Pulmonary: no cough, no increased work of breathing, normal respiratory effort, no adventitious lung sounds from auscultation Abdomen: soft, non tender, positive bowel sounds in all quadrants, no guarding GU:  no suprapubic tenderness Eyes: Normal lids, no discharge, sclera anicteric ENMT: Moist mucous membranes Musculoskeletal:  no edema in BLE Skin: no rash to visible skin, warm without cyanosis Psych: non-anxious affect Neurological: Weakness but otherwise non focal Heme/lymph/immuno: no bruises, no bleeding  Thank you for the opportunity to participate in the care of Desmen Schoffstall Please call our office at [phone] if we can be of additional assistance.  Note: Portions of this note were generated with Lobbyist. Dictation errors may occur despite best attempts at proofreading.  Teodoro Spray, NP

## 2020-05-08 ENCOUNTER — Encounter (HOSPITAL_COMMUNITY): Payer: Self-pay

## 2020-05-08 ENCOUNTER — Other Ambulatory Visit: Payer: Self-pay

## 2020-05-08 ENCOUNTER — Ambulatory Visit (HOSPITAL_COMMUNITY)
Admission: RE | Admit: 2020-05-08 | Discharge: 2020-05-08 | Disposition: A | Discharge status: Home / Self Care | Payer: Medicare Other | Source: Ambulatory Visit | Attending: Emergency Medicine | Admitting: Emergency Medicine

## 2020-05-08 ENCOUNTER — Non-Acute Institutional Stay: Payer: Medicare Other | Admitting: Hospice

## 2020-05-08 DIAGNOSIS — D49519 Neoplasm of unspecified behavior of unspecified kidney: Secondary | ICD-10-CM | POA: Insufficient documentation

## 2020-05-08 DIAGNOSIS — N23 Unspecified renal colic: Secondary | ICD-10-CM | POA: Diagnosis present

## 2020-05-08 DIAGNOSIS — R1310 Dysphagia, unspecified: Secondary | ICD-10-CM

## 2020-05-08 DIAGNOSIS — R112 Nausea with vomiting, unspecified: Secondary | ICD-10-CM

## 2020-05-08 DIAGNOSIS — Z515 Encounter for palliative care: Secondary | ICD-10-CM

## 2020-05-08 NOTE — Progress Notes (Signed)
[facility] Consult Note Telephone: [phone]  Fax: [phone]  PATIENT NAME: [name] [name] DOB: 10-06-1945 MRN: [id number]  REFERRING PROVIDER: [name] Np  RESPONSIBLE PARTY:Self Extended Emergency Contact Information Primary Emergency Contact: [name],[name] Address: [address] [place], [place] [address] [name] of [place] Phone: [phone] Relation: Daughter Spouse Karriem Muench - out of town, [phone]         Contact Information    Name Relation Home Work Mobile   [name],[name] Daughter [phone]     Ugochukwu, [place] [phone]     Danyal, [place] [phone]       Visit is to build trust and highlight Palliative Medicine as specialized medical care for people living with serious illness, aimed at facilitating better quality of life through symptoms relief, assisting with advance care planning and complex medical decision making.   RECOMMENDATIONS/PLAN:   Advance Care Planning/Code Status: Reviewed. Patient affirmed he is a DNR/DNI  Goals of Care: Goals of care include to maximize quality of life and symptom management. Patient hopes to get up someday and write books. 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  Symptom management/Plan:  Nausea vomiting: Nausea and vomiting improved at this time no nausea or vomiting in the last 24 hours.  Zofran 4 mg every 6 hour as needed for nausea/vomiting.  Continue to monitor for dehydration; ensure adequate oral intake as patient is able to keep down at this time.    Repeat KUB, routine CBC BMP.  Plan is for CT scan of abdomen to rule out neoplasm of bilateral kidneys Constipation:  17 g MiraLAX in 4 to 6 ounces of choice fluid daily as needed for constipation Dysphagia:Continue regular diet, Level 6 - Soft & Bite Sized texture.Aspiration precautions. Monitor closely for signs of  aspiration anddowngrade diet as needed. ST consult as needed. Follow up: Palliative care will continue to follow for complex medical decision making, advance care planning, and clarification of goals. Return 6 weeks or prn. Encouraged to call provider sooner with any concerns.  CHIEF COMPLAINT: Palliative follow up visit/Nausea vomiting  HISTORY OF PRESENT ILLNESS:   Follow-up visit for [name] [name] a 75 y.o. male with multiple medical problems including acute  nausea/vomiting, ongoing for about 2 weeks, vomiting is intermittent,  is preceeded by hiccupping, sometimes making him not want to eat; he was seen in ED 04/26/2020 for nausea vomiting.    He reports there has been no nausea or vomiting in the past 24 hours, he feels a lot better than before. He has a history of CVA with left hemiplegia, hypertension, CHF, CKD.  History obtained from review of EMR, discussion with primary team, caregiver and/or patient. Records reviewed and summarized above. All 10 point systems reviewed and are negative except as documented in history of present illness above  Review and summarization of Epic records shows history from other than patient.   Palliative Care was asked to follow this patient by consultation request of [name] NP to help address complex decision making in the context of advance care planning and goals of care clarification.   CODE STATUS: DNR/DNI  PPS: 40%  HOSPICE ELIGIBILITY/DIAGNOSIS: TBD  PAST MEDICAL HISTORY:      Past Medical History:  Diagnosis Date  . Chronic systolic CHF (congestive heart failure) ([place])   . CVA, old, aphasia   . Diabetes ([place])   . Hemiplegia ([place])   . HTN (hypertension)   . Intestinal obstruction (  [place]) 11/2015  . Peripheral vascular disease ([place])   . Pneumonia   . Stroke [facility])      SOCIAL HX: @SOCX  Patient lives at SNF for ongoing care   FAMILY HX:       Family History  Problem Relation Age of Onset  . CAD Neg Hx      Review lab tests/diagnostics KUB results showed Nonspecific bowel gas pattern. No definite obstruction appreciated. Results for GLENWOOD, REVOIR (MRN [id number]) as of 05/05/2020 11:40  Ref. Range 04/26/2020 11:21  WBC Latest Ref Range: 4.0 - 10.5 K/uL 7.2  RBC Latest Ref Range: 4.22 - 5.81 MIL/uL 5.75  Hemoglobin Latest Ref Range: 13.0 - 17.0 g/dL 15.0  HCT Latest Ref Range: 39.0 - 52.0 % 47.9  MCV Latest Ref Range: 80.0 - 100.0 fL 83.3  MCH Latest Ref Range: 26.0 - 34.0 pg 26.1  MCHC Latest Ref Range: 30.0 - 36.0 g/dL 31.3  RDW Latest Ref Range: 11.5 - 15.5 % 16.7 (H)  Platelets Latest Ref Range: 150 - 400 K/uL 260  nRBC Latest Ref Range: 0.0 - 0.2 % 0.0  Neutrophils Latest Units: % 70  Lymphocytes Latest Units: % 23  Monocytes Relative Latest Units: % 7  Eosinophil Latest Units: % 0  Basophil Latest Units: % 0  Immature Granulocytes Latest Units: % 0  NEUT# Latest Ref Range: 1.7 - 7.7 K/uL 5.0  Lymphocyte # Latest Ref Range: 0.7 - 4.0 K/uL 1.6  Monocyte # Latest Ref Range: 0.1 - 1.0 K/uL 0.5  Eosinophils Absolute Latest Ref Range: 0.0 - 0.5 K/uL 0.0    ALLERGIES: No Known Allergies    PERTINENT MEDICATIONS:      Outpatient Encounter Medications as of 05/05/2020  Medication Sig  . acetaminophen (TYLENOL) 325 MG tablet Place 650 mg into feeding tube every 6 (six) hours as needed for mild pain.  Marland Kitchen amiodarone (PACERONE) 200 MG tablet Take 200 mg by mouth daily.   Marland Kitchen aspirin 81 MG chewable tablet Chew 81 mg by mouth daily.   Marland Kitchen atorvastatin (LIPITOR) 40 MG tablet Take 40 mg by mouth at bedtime.   . Cholecalciferol (VITAMIN D-3) 125 MCG (5000 UT) TABS Take 1 tablet by mouth daily.  Marland Kitchen esomeprazole (NEXIUM) 20 MG capsule Take 20 mg by mouth daily.  . ferrous sulfate 325 (65 FE) MG EC tablet Take 325 mg by mouth in the morning and at bedtime.  . furosemide (LASIX) 20 MG tablet Place 2 tablets (40 mg total) into feeding tube daily. (Patient not taking: Reported on 09/28/2019)  .  gabapentin (NEURONTIN) 400 MG capsule Take 400 mg by mouth 2 (two) times daily.  . magnesium oxide (MAG-OX) 400 MG tablet Take 400 mg by mouth 2 (two) times daily.   . promethazine (PHENERGAN) 12.5 MG suppository Place 12.5 mg rectally every 6 (six) hours as needed for nausea or vomiting.   . promethazine (PHENERGAN) 25 MG/ML injection Inject 25 mg into the muscle daily as needed for nausea or vomiting.  . sacubitril-valsartan (ENTRESTO) 24-26 MG Take 1 tablet by mouth 2 (two) times daily.  . traMADol (ULTRAM) 50 MG tablet Take 50 mg by mouth 3 (three) times daily as needed for moderate pain or severe pain.   . vitamin C (ASCORBIC ACID) 500 MG tablet Take 500 mg by mouth 2 (two) times daily.    No facility-administered encounter medications on file as of 05/05/2020.     ROS  General: NAD EYES: denies vision changes ENMT:Endorses difficulty swallowing when food is  not softenough,no xerostomia Cardiovascular: denies chest pain Pulmonary: deniescough, denies SOB  Abdomen: endorses fair appetite, no constipation or diarrhea, denies nausea/vomiting GU: denies dysuria or urinary frequency KSH:[id number] ROM limitations, no falls reported Skin: denies rashes or wounds Neurological: endorses weakness, denies pain, denies insomnia Psych: Endorses positive mood Heme/lymph/immuno: denies bruises, abnormal bleeding   PHYSICAL EXAM  General: In no acute distress,  Cardiovascular: regular rate and rhythm Pulmonary: no cough, no increased work of breathing, normal respiratory effort, no adventitious lung sounds from auscultation Abdomen: soft, non tender, positive bowel sounds in all quadrants, no guarding GU: no suprapubic tenderness Eyes: Normal lids, no discharge, sclera anicteric ENMT: Moist mucous membranes Musculoskeletal: no edema in BLE Skin: no rash to visible skin, warm without cyanosis Psych: non-anxious affect Neurological: Weakness but otherwise non  focal Heme/lymph/immuno: no bruises, no bleeding  I spent 50 minutes providing this consultation; this includes time spent with patient/family, chart review and documentation. More than 50% of the time in this consultation was spent on counseling and coordinating communication  Thank you for the opportunity to participate in the care of [name] [name] Please call our office at [phone] if we can be of additional assistance. Note: Portions of this note were generated with Lobbyist. Dictation errors may occur despite best attempts at proofreading.  Teodoro Spray, NP

## 2020-06-26 ENCOUNTER — Other Ambulatory Visit: Payer: Self-pay

## 2020-06-26 ENCOUNTER — Non-Acute Institutional Stay: Payer: Medicare Other | Admitting: Hospice

## 2020-06-26 DIAGNOSIS — R112 Nausea with vomiting, unspecified: Secondary | ICD-10-CM

## 2020-06-26 DIAGNOSIS — I69352 Hemiplegia and hemiparesis following cerebral infarction affecting left dominant side: Secondary | ICD-10-CM

## 2020-06-26 DIAGNOSIS — Z515 Encounter for palliative care: Secondary | ICD-10-CM

## 2020-06-26 NOTE — Progress Notes (Signed)
[facility] Consult Note Telephone: [phone]  Fax: [phone]  PATIENT NAME: [name] [name] DOB: 05-22-1945 MRN: [id number]  PRIMARY CARE PROVIDER:   Patient, No Pcp Per (Inactive) No referring provider defined for this encounter.  REFERRING PROVIDER: Patient, No Pcp Per (Inactive) No referring provider defined for this encounter.  RESPONSIBLE PARTY:   Contact Information     Name Relation Home Work Mobile   [name],[name] Daughter [phone]     [name] [name], [name] [name] [phone]     Ishaaq, [place] [phone]         Visit is to build trust and highlight Palliative Medicine as specialized medical care for people living with serious illness, aimed at facilitating better quality of life through symptoms relief, assisting with advance care planning and complex medical decision making. This is a follow up visit.  RECOMMENDATIONS/PLAN:   Advance Care Planning/Code Status: Patient is a DNR/DNI  Goals of Care: Goals of care include to maximize quality of life and symptom management.  Visit consisted of counseling and education dealing with the complex and emotionally intense issues of symptom management and palliative care in the setting of serious and potentially life-threatening illness. Palliative care team will continue to support patient, patient's family, and medical team.  Symptom management/Plan:  Nausea vomiting is resolved.  Zofran 4 mg every 6 hours as needed for nausea vomiting and hand. Constipation: Improved with daily MiraLAX. Hemiplegia/hemiparesis: Right side pain likely related to hemiplegia/hemiparesis following cerebral infarction affecting left nondominant side managed with tramadol, Tylenol and gabapentin.  Encourage physical activity as tolerated. Follow up: Palliative care will continue to follow for complex medical decision making, advance care planning, and clarification of goals. Return 6 weeks or prn.  Encouraged to call provider sooner with any concerns.  CHIEF COMPLAINT: Palliative follow up  HISTORY OF PRESENT ILLNESS:  [name] [name] a 75 y.o. male with multiple medical problems including hemiplegia/hemiparesis following cerebral infarction nondominant side, dysphagia, hypertension, CHF, CKD.  Patient reports he feels pain on his right side of the trunk; reports this is well managed with current pain medications.  He denies nausea or vomiting, no chest pain, no cough.  History obtained from review of EMR, discussion with primary team, family and/or patient. Records reviewed and summarized above. All 10 point systems reviewed and are negative except as documented in history of present illness above  Review and summarization of Epic records shows history from other than patient.   Palliative Care was asked to follow this patient o help address complex decision making in the context of advance care planning and goals of care clarification.   PHYSICAL EXAM  General: In no acute distress, appropriately dressed Cardiovascular: regular rate and rhythm; no edema in BLE Pulmonary: no cough, no increased work of breathing, normal respiratory effort Abdomen: soft, non tender, no guarding, positive bowel sounds in all quadrants GU:  no suprapubic tenderness Eyes: Normal lids, no discharge ENMT: Moist mucous membranes Musculoskeletal:  weakness-left hemiplegia/hemiparesis.  He endorsed occasional side pain Skin: no rash to visible skin, warm without cyanosis,  Psych: non-anxious affect Neurological: Weakness but otherwise non focal Heme/lymph/immuno: no bruises, no bleeding  PERTINENT MEDICATIONS:  Outpatient Encounter Medications as of 06/26/2020  Medication Sig   acetaminophen (TYLENOL) 325 MG tablet Place 650 mg into feeding tube every 6 (six) hours as needed for mild pain.   amiodarone (PACERONE) 200 MG tablet Take 200 mg by mouth daily.    aspirin 81 MG chewable tablet Chew 81 mg by  mouth  daily.    atorvastatin (LIPITOR) 40 MG tablet Take 40 mg by mouth at bedtime.    Cholecalciferol (VITAMIN D-3) 125 MCG (5000 UT) TABS Take 1 tablet by mouth daily.   esomeprazole (NEXIUM) 20 MG capsule Take 20 mg by mouth daily.   ferrous sulfate 325 (65 FE) MG EC tablet Take 325 mg by mouth in the morning and at bedtime.   furosemide (LASIX) 20 MG tablet Place 2 tablets (40 mg total) into feeding tube daily. (Patient not taking: Reported on 09/28/2019)   gabapentin (NEURONTIN) 400 MG capsule Take 400 mg by mouth 2 (two) times daily.   magnesium oxide (MAG-OX) 400 MG tablet Take 400 mg by mouth 2 (two) times daily.    promethazine (PHENERGAN) 12.5 MG suppository Place 12.5 mg rectally every 6 (six) hours as needed for nausea or vomiting.    promethazine (PHENERGAN) 25 MG/ML injection Inject 25 mg into the muscle daily as needed for nausea or vomiting.   sacubitril-valsartan (ENTRESTO) 24-26 MG Take 1 tablet by mouth 2 (two) times daily.   traMADol (ULTRAM) 50 MG tablet Take 50 mg by mouth 3 (three) times daily as needed for moderate pain or severe pain.    vitamin C (ASCORBIC ACID) 500 MG tablet Take 500 mg by mouth 2 (two) times daily.    No facility-administered encounter medications on file as of 06/26/2020.    HOSPICE ELIGIBILITY/DIAGNOSIS: TBD  PAST MEDICAL HISTORY:  Past Medical History:  Diagnosis Date   Chronic systolic CHF (congestive heart failure) ([facility])    CVA, old, aphasia    Diabetes ([place])    Hemiplegia ([place])    HTN (hypertension)    Intestinal obstruction ([place]) 11/2015   Peripheral vascular disease ([facility])    Pneumonia    Stroke ([place])      Review lab tests/diagnostics No results for input(s): WBC, HGB, HCT, PLT, MCV in the last 168 hours. No results for input(s): NA, K, CL, CO2, BUN, CREATININE, GLUCOSE in the last 168 hours. CrCl cannot be calculated (Patient's most recent lab result is older than the maximum 21 days allowed.).  ALLERGIES: No Known Allergies    I  spent 50 minutes providing this consultation; this includes time spent with patient/family, chart review and documentation. More than 50% of the time in this consultation was spent on counseling and coordinating communication   Thank you for the opportunity to participate in the care of [name] [name] Please call our office at [phone] if we can be of additional assistance.  Note: Portions of this note were generated with [employer]. Dictation errors may occur despite best attempts at proofreading.  Teodoro Spray, NP

## 2020-09-22 ENCOUNTER — Non-Acute Institutional Stay: Payer: Medicare Other | Admitting: Hospice

## 2020-09-22 ENCOUNTER — Other Ambulatory Visit: Payer: Self-pay

## 2020-09-22 DIAGNOSIS — Z515 Encounter for palliative care: Secondary | ICD-10-CM

## 2020-09-22 DIAGNOSIS — K5901 Slow transit constipation: Secondary | ICD-10-CM

## 2020-09-22 DIAGNOSIS — I69352 Hemiplegia and hemiparesis following cerebral infarction affecting left dominant side: Secondary | ICD-10-CM

## 2020-09-22 DIAGNOSIS — I5022 Chronic systolic (congestive) heart failure: Secondary | ICD-10-CM

## 2020-09-22 NOTE — Progress Notes (Signed)
[facility] Consult Note Telephone: [phone]  Fax: [phone]  PATIENT NAME: [name] [name] DOB: 1945-04-18 MRN: [id number]  PRIMARY CARE PROVIDER: Dr. Garwin Brothers  REFERRING PROVIDER: Dr. Garwin Brothers  RESPONSIBLE PARTY:   Contact Information     Name Relation Home Work Mobile   [name],[name] Daughter [phone]     Harrison, Zetina [phone]     Shawn, Carattini [phone]         Visit is to build trust and highlight Palliative Medicine as specialized medical care for people living with serious illness, aimed at facilitating better quality of life through symptoms relief, assisting with advance care planning and complex medical decision making. This is a follow up visit.  RECOMMENDATIONS/PLAN:   Advance Care Planning/Code Status: Patient is DNR/DNI  Goals of Care: Goals of care include to maximize quality of life and symptom management.  Visit consisted of counseling and education dealing with the complex and emotionally intense issues of symptom management and palliative care in the setting of serious and potentially life-threatening illness. Palliative care team will continue to support patient, patient's family, and medical team.  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  Encourage physical activity as tolerated CHF: Continue Lasix as ordered.  Constipation: Continue MiraLAX and senna S daily.  No report of nausea or vomiting.  Follow up: Palliative care will continue to follow for complex medical decision making, advance care planning, and clarification of goals. Return 6 weeks or prn. Encouraged to call provider sooner with any concerns.  CHIEF COMPLAINT: Palliative follow up  HISTORY OF PRESENT ILLNESS:  [name] [name] a 75 y.o. 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  Patient denies wheezing/respiratory distress, no pain/discomfort.  Nursing with no concerns today.  History obtained from review of EMR, discussion with primary team, family and/or patient. Records reviewed and summarized above. All 10 point systems reviewed and are negative except as documented in history of present illness above  Review and summarization of Epic records shows history from other than patient.   Palliative Care was asked to follow this patient o help address complex decision making in the context of advance care planning and goals of care clarification.   PHYSICAL EXAM  General: In no acute distress, appropriately dressed Cardiovascular: regular rate and rhythm; no edema in BLE Pulmonary: no cough, no increased work of breathing Abdomen: soft, non tender, no guarding, positive bowel sounds in all quadrants GU:  no suprapubic tenderness Eyes: Normal lids, no discharge ENMT: Moist mucous membranes Musculoskeletal:  weakness  Skin: no rash to visible skin, warm without cyanosis,  Psych: non-anxious affect Neurological: Weakness but otherwise non focal Heme/lymph/immuno: no bruises, no bleeding  PERTINENT MEDICATIONS:  Outpatient Encounter Medications as of 09/22/2020  Medication Sig   acetaminophen (TYLENOL) 325 MG tablet Place 650 mg into feeding tube every 6 (six) hours as needed for mild pain.   amiodarone (PACERONE) 200 MG tablet Take 200 mg by mouth daily.    aspirin 81 MG chewable tablet Chew 81 mg by mouth daily.    atorvastatin (LIPITOR) 40 MG tablet Take 40 mg by mouth at bedtime.    Cholecalciferol (VITAMIN D-3) 125 MCG (5000 UT) TABS Take 1 tablet by mouth daily.   esomeprazole (NEXIUM) 20 MG capsule Take 20 mg by mouth daily.  ferrous sulfate 325 (65 FE) MG EC tablet Take 325 mg by mouth in the morning  and at bedtime.   furosemide (LASIX) 20 MG tablet Place 2 tablets (40 mg total) into feeding tube daily. (Patient not taking: Reported on 09/28/2019)   gabapentin (NEURONTIN) 400 MG capsule Take 400 mg by mouth 2 (two) times daily.   magnesium oxide (MAG-OX) 400 MG tablet Take 400 mg by mouth 2 (two) times daily.    promethazine (PHENERGAN) 12.5 MG suppository Place 12.5 mg rectally every 6 (six) hours as needed for nausea or vomiting.    promethazine (PHENERGAN) 25 MG/ML injection Inject 25 mg into the muscle daily as needed for nausea or vomiting.   sacubitril-valsartan (ENTRESTO) 24-26 MG Take 1 tablet by mouth 2 (two) times daily.   traMADol (ULTRAM) 50 MG tablet Take 50 mg by mouth 3 (three) times daily as needed for moderate pain or severe pain.    vitamin C (ASCORBIC ACID) 500 MG tablet Take 500 mg by mouth 2 (two) times daily.    No facility-administered encounter medications on file as of 09/22/2020.    HOSPICE ELIGIBILITY/DIAGNOSIS: TBD  PAST MEDICAL HISTORY:  Past Medical History:  Diagnosis Date   Chronic systolic CHF (congestive heart failure) (HCC)    CVA, old, aphasia    Diabetes ([place])    Hemiplegia ([place])    HTN (hypertension)    Intestinal obstruction ([place]) 11/2015   Peripheral vascular disease (HCC)    Pneumonia    Stroke ([place])      ALLERGIES: No Known Allergies    I spent 50 minutes providing this consultation; this includes time spent with patient/family, chart review and documentation. More than 50% of the time in this consultation was spent on counseling and coordinating communication   Thank you for the opportunity to participate in the care of [name] [name] Please call our office at [phone] if we can be of additional assistance.  Note: Portions of this note were generated with Lobbyist. Dictation errors may occur despite best attempts at proofreading.  Teodoro Spray, NP

## 2021-01-26 ENCOUNTER — Other Ambulatory Visit: Payer: Self-pay

## 2021-01-26 ENCOUNTER — Non-Acute Institutional Stay: Payer: Commercial Managed Care - HMO | Admitting: Hospice

## 2021-01-26 DIAGNOSIS — I5022 Chronic systolic (congestive) heart failure: Secondary | ICD-10-CM

## 2021-01-26 DIAGNOSIS — K5901 Slow transit constipation: Secondary | ICD-10-CM

## 2021-01-26 DIAGNOSIS — Z515 Encounter for palliative care: Secondary | ICD-10-CM

## 2021-01-26 DIAGNOSIS — I69352 Hemiplegia and hemiparesis following cerebral infarction affecting left dominant side: Secondary | ICD-10-CM

## 2021-01-26 NOTE — Progress Notes (Signed)
[facility] Consult Note Telephone: [phone]  Fax: [phone]  PATIENT NAME: [name] [name] DOB: Jul 02, 1945 MRN: [id number]  PRIMARY CARE PROVIDER: Dr. Garwin Brothers  REFERRING PROVIDER: Dr. Garwin Brothers  RESPONSIBLE PARTY:  Self Contact Information     Name Relation Home Work Mobile   [name],[name] Daughter [phone]     Charlis, Harner [phone]     Mando, Blatz [phone]         Visit is to build trust and highlight Palliative Medicine as specialized medical care for people living with serious illness, aimed at facilitating better quality of life through symptoms relief, assisting with advance care planning and complex medical decision making. This is a follow up visit.  RECOMMENDATIONS/PLAN:   Advance Care Planning/Code Status: Patient is DNR/DNI  Goals of Care: Goals of care include to maximize quality of life and symptom management.  Visit consisted of counseling and education dealing with the complex and emotionally intense issues of symptom management and palliative care in the setting of serious and potentially life-threatening illness. Palliative care team will continue to support patient, patient's family, and medical team.  Symptom management/Plan:  Hemiplegia/hemiparesis: Max assist for ADLs, feeds self after set up. Continue Tylenol and Gabapentin for pain likely related to hemiplegia/hemiparesis following cerebral infarction affecting left nondominant side. Encourage physical activity as tolerated CHF: Continue Lasix as ordered.  Constipation: Continue MiraLAX and senna S daily.  No report of nausea or vomiting.  Follow up: Palliative care will continue to follow for complex medical decision making, advance care planning, and clarification of goals. Return 6 weeks or prn. Encouraged to call provider sooner with any concerns.  CHIEF COMPLAINT: Palliative follow up  HISTORY OF PRESENT ILLNESS:   [name] [name] a 76 y.o. 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 History of hypertension, CHF, CKD, dysphagia.  Patient denies wheezing/respiratory distress, no pain/discomfort.  Nursing with no concerns today.  History obtained from review of EMR, discussion with primary team, family and/or patient. Records reviewed and summarized above. All 10 point systems reviewed and are negative except as documented in history of present illness above  Review and summarization of Epic records shows history from other than patient.   Palliative Care was asked to follow this patient o help address complex decision making in the context of advance care planning and goals of care clarification.   PHYSICAL EXAM  General: In no acute distress, appropriately dressed Cardiovascular: regular rate and rhythm; no edema in BLE Pulmonary: no cough, no increased work of breathing Abdomen: soft, non tender, no guarding, positive bowel sounds in all quadrants GU:  no suprapubic tenderness Eyes: Normal lids, no discharge ENMT: Moist mucous membranes Musculoskeletal:  weakness  Skin: no rash to visible skin, warm without cyanosis,  Psych: non-anxious affect Neurological: Weakness but otherwise non focal Heme/lymph/immuno: no bruises, no bleeding  PERTINENT MEDICATIONS:  Outpatient Encounter Medications as of 01/26/2021  Medication Sig   acetaminophen (TYLENOL) 325 MG tablet Place 650 mg into feeding tube every 6 (six) hours as needed for mild pain.   amiodarone (PACERONE) 200 MG tablet Take 200 mg by mouth daily.    aspirin 81 MG chewable tablet Chew 81 mg by mouth daily.    atorvastatin (LIPITOR) 40 MG tablet Take 40 mg by mouth at bedtime.    Cholecalciferol (VITAMIN D-3) 125 MCG (5000 UT) TABS Take  1 tablet by mouth daily.   esomeprazole (NEXIUM)  20 MG capsule Take 20 mg by mouth daily.   ferrous sulfate 325 (65 FE) MG EC tablet Take 325 mg by mouth in the morning and at bedtime.   furosemide (LASIX) 20 MG tablet Place 2 tablets (40 mg total) into feeding tube daily. (Patient not taking: Reported on 09/28/2019)   gabapentin (NEURONTIN) 400 MG capsule Take 400 mg by mouth 2 (two) times daily.   magnesium oxide (MAG-OX) 400 MG tablet Take 400 mg by mouth 2 (two) times daily.    promethazine (PHENERGAN) 12.5 MG suppository Place 12.5 mg rectally every 6 (six) hours as needed for nausea or vomiting.    promethazine (PHENERGAN) 25 MG/ML injection Inject 25 mg into the muscle daily as needed for nausea or vomiting.   sacubitril-valsartan (ENTRESTO) 24-26 MG Take 1 tablet by mouth 2 (two) times daily.   traMADol (ULTRAM) 50 MG tablet Take 50 mg by mouth 3 (three) times daily as needed for moderate pain or severe pain.    vitamin C (ASCORBIC ACID) 500 MG tablet Take 500 mg by mouth 2 (two) times daily.    No facility-administered encounter medications on file as of 01/26/2021.    HOSPICE ELIGIBILITY/DIAGNOSIS: TBD  PAST MEDICAL HISTORY:  Past Medical History:  Diagnosis Date   Chronic systolic CHF (congestive heart failure) (HCC)    CVA, old, aphasia    Diabetes ([place])    Hemiplegia ([place])    HTN (hypertension)    Intestinal obstruction ([place]) 11/2015   Peripheral vascular disease (HCC)    Pneumonia    Stroke ([place])      ALLERGIES: No Known Allergies    I spent 45 minutes providing this consultation; this includes time spent with patient/family, chart review and documentation. More than 50% of the time in this consultation was spent on counseling and coordinating communication   Thank you for the opportunity to participate in the care of [name] [name] Please call our office at [phone] if we can be of additional assistance.  Note: Portions of this note were generated with Lobbyist. Dictation errors may occur  despite best attempts at proofreading.  Teodoro Spray, NP

## 2021-04-29 ENCOUNTER — Inpatient Hospital Stay (HOSPITAL_COMMUNITY)
Admission: EM | Admit: 2021-04-29 | Discharge: 2021-05-07 | DRG: 682 | Disposition: A | Discharge status: Skilled Nursing Facility | Payer: Medicare Other | Attending: Internal Medicine | Admitting: Internal Medicine

## 2021-04-29 ENCOUNTER — Inpatient Hospital Stay (HOSPITAL_COMMUNITY): Payer: Medicare Other

## 2021-04-29 ENCOUNTER — Encounter (HOSPITAL_COMMUNITY): Payer: Self-pay | Admitting: Emergency Medicine

## 2021-04-29 ENCOUNTER — Other Ambulatory Visit: Payer: Self-pay

## 2021-04-29 DIAGNOSIS — N1330 Unspecified hydronephrosis: Secondary | ICD-10-CM | POA: Diagnosis not present

## 2021-04-29 DIAGNOSIS — I69320 Aphasia following cerebral infarction: Secondary | ICD-10-CM | POA: Diagnosis not present

## 2021-04-29 DIAGNOSIS — R579 Shock, unspecified: Secondary | ICD-10-CM | POA: Diagnosis not present

## 2021-04-29 DIAGNOSIS — R680 Hypothermia, not associated with low environmental temperature: Secondary | ICD-10-CM | POA: Diagnosis present

## 2021-04-29 DIAGNOSIS — G9340 Encephalopathy, unspecified: Secondary | ICD-10-CM | POA: Diagnosis not present

## 2021-04-29 DIAGNOSIS — I959 Hypotension, unspecified: Secondary | ICD-10-CM | POA: Diagnosis not present

## 2021-04-29 DIAGNOSIS — L89126 Pressure-induced deep tissue damage of left upper back: Secondary | ICD-10-CM | POA: Diagnosis present

## 2021-04-29 DIAGNOSIS — E872 Acidosis, unspecified: Secondary | ICD-10-CM | POA: Diagnosis not present

## 2021-04-29 DIAGNOSIS — L89316 Pressure-induced deep tissue damage of right buttock: Secondary | ICD-10-CM | POA: Diagnosis present

## 2021-04-29 DIAGNOSIS — J9601 Acute respiratory failure with hypoxia: Secondary | ICD-10-CM | POA: Diagnosis not present

## 2021-04-29 DIAGNOSIS — R7989 Other specified abnormal findings of blood chemistry: Secondary | ICD-10-CM

## 2021-04-29 DIAGNOSIS — Z6824 Body mass index (BMI) 24.0-24.9, adult: Secondary | ICD-10-CM

## 2021-04-29 DIAGNOSIS — I69354 Hemiplegia and hemiparesis following cerebral infarction affecting left non-dominant side: Secondary | ICD-10-CM

## 2021-04-29 DIAGNOSIS — N400 Enlarged prostate without lower urinary tract symptoms: Secondary | ICD-10-CM

## 2021-04-29 DIAGNOSIS — Z9189 Other specified personal risk factors, not elsewhere classified: Secondary | ICD-10-CM | POA: Diagnosis not present

## 2021-04-29 DIAGNOSIS — Z7982 Long term (current) use of aspirin: Secondary | ICD-10-CM

## 2021-04-29 DIAGNOSIS — I130 Hypertensive heart and chronic kidney disease with heart failure and stage 1 through stage 4 chronic kidney disease, or unspecified chronic kidney disease: Secondary | ICD-10-CM | POA: Diagnosis present

## 2021-04-29 DIAGNOSIS — N401 Enlarged prostate with lower urinary tract symptoms: Secondary | ICD-10-CM | POA: Diagnosis present

## 2021-04-29 DIAGNOSIS — I5042 Chronic combined systolic (congestive) and diastolic (congestive) heart failure: Secondary | ICD-10-CM | POA: Diagnosis present

## 2021-04-29 DIAGNOSIS — Z515 Encounter for palliative care: Secondary | ICD-10-CM | POA: Diagnosis not present

## 2021-04-29 DIAGNOSIS — N320 Bladder-neck obstruction: Secondary | ICD-10-CM | POA: Diagnosis present

## 2021-04-29 DIAGNOSIS — Z66 Do not resuscitate: Secondary | ICD-10-CM | POA: Diagnosis present

## 2021-04-29 DIAGNOSIS — E43 Unspecified severe protein-calorie malnutrition: Secondary | ICD-10-CM | POA: Diagnosis present

## 2021-04-29 DIAGNOSIS — E1165 Type 2 diabetes mellitus with hyperglycemia: Secondary | ICD-10-CM | POA: Diagnosis present

## 2021-04-29 DIAGNOSIS — N138 Other obstructive and reflux uropathy: Secondary | ICD-10-CM | POA: Diagnosis present

## 2021-04-29 DIAGNOSIS — N19 Unspecified kidney failure: Secondary | ICD-10-CM | POA: Diagnosis not present

## 2021-04-29 DIAGNOSIS — J90 Pleural effusion, not elsewhere classified: Secondary | ICD-10-CM | POA: Diagnosis present

## 2021-04-29 DIAGNOSIS — E1151 Type 2 diabetes mellitus with diabetic peripheral angiopathy without gangrene: Secondary | ICD-10-CM | POA: Diagnosis present

## 2021-04-29 DIAGNOSIS — E871 Hypo-osmolality and hyponatremia: Secondary | ICD-10-CM | POA: Diagnosis present

## 2021-04-29 DIAGNOSIS — R112 Nausea with vomiting, unspecified: Secondary | ICD-10-CM

## 2021-04-29 DIAGNOSIS — Z992 Dependence on renal dialysis: Secondary | ICD-10-CM

## 2021-04-29 DIAGNOSIS — E11649 Type 2 diabetes mellitus with hypoglycemia without coma: Secondary | ICD-10-CM | POA: Diagnosis not present

## 2021-04-29 DIAGNOSIS — R1310 Dysphagia, unspecified: Secondary | ICD-10-CM | POA: Diagnosis present

## 2021-04-29 DIAGNOSIS — I480 Paroxysmal atrial fibrillation: Secondary | ICD-10-CM | POA: Diagnosis present

## 2021-04-29 DIAGNOSIS — E875 Hyperkalemia: Secondary | ICD-10-CM

## 2021-04-29 DIAGNOSIS — Z79899 Other long term (current) drug therapy: Secondary | ICD-10-CM

## 2021-04-29 DIAGNOSIS — N132 Hydronephrosis with renal and ureteral calculous obstruction: Secondary | ICD-10-CM | POA: Diagnosis present

## 2021-04-29 DIAGNOSIS — Z789 Other specified health status: Secondary | ICD-10-CM | POA: Diagnosis not present

## 2021-04-29 DIAGNOSIS — L89326 Pressure-induced deep tissue damage of left buttock: Secondary | ICD-10-CM | POA: Diagnosis present

## 2021-04-29 DIAGNOSIS — R471 Dysarthria and anarthria: Secondary | ICD-10-CM | POA: Diagnosis present

## 2021-04-29 DIAGNOSIS — I5022 Chronic systolic (congestive) heart failure: Secondary | ICD-10-CM | POA: Diagnosis present

## 2021-04-29 DIAGNOSIS — R339 Retention of urine, unspecified: Secondary | ICD-10-CM

## 2021-04-29 DIAGNOSIS — L89156 Pressure-induced deep tissue damage of sacral region: Secondary | ICD-10-CM | POA: Diagnosis present

## 2021-04-29 DIAGNOSIS — E876 Hypokalemia: Secondary | ICD-10-CM | POA: Diagnosis not present

## 2021-04-29 DIAGNOSIS — I5020 Unspecified systolic (congestive) heart failure: Secondary | ICD-10-CM | POA: Diagnosis not present

## 2021-04-29 DIAGNOSIS — B179 Acute viral hepatitis, unspecified: Secondary | ICD-10-CM | POA: Diagnosis present

## 2021-04-29 DIAGNOSIS — N1831 Chronic kidney disease, stage 3a: Secondary | ICD-10-CM | POA: Diagnosis present

## 2021-04-29 DIAGNOSIS — R4182 Altered mental status, unspecified: Secondary | ICD-10-CM

## 2021-04-29 DIAGNOSIS — E785 Hyperlipidemia, unspecified: Secondary | ICD-10-CM | POA: Diagnosis present

## 2021-04-29 DIAGNOSIS — G9349 Other encephalopathy: Secondary | ICD-10-CM | POA: Diagnosis present

## 2021-04-29 DIAGNOSIS — Z8701 Personal history of pneumonia (recurrent): Secondary | ICD-10-CM

## 2021-04-29 DIAGNOSIS — G9341 Metabolic encephalopathy: Secondary | ICD-10-CM | POA: Diagnosis present

## 2021-04-29 DIAGNOSIS — E1122 Type 2 diabetes mellitus with diabetic chronic kidney disease: Secondary | ICD-10-CM | POA: Diagnosis present

## 2021-04-29 DIAGNOSIS — N179 Acute kidney failure, unspecified: Principal | ICD-10-CM

## 2021-04-29 DIAGNOSIS — Z87891 Personal history of nicotine dependence: Secondary | ICD-10-CM

## 2021-04-29 DIAGNOSIS — I69391 Dysphagia following cerebral infarction: Secondary | ICD-10-CM

## 2021-04-29 LAB — ECHOCARDIOGRAM COMPLETE
AR max vel: 2.99 cm2
AV Peak grad: 5.1 mmHg
Ao pk vel: 1.13 m/s
Area-P 1/2: 4.93 cm2
Height: 71 in
S' Lateral: 4.85 cm
Weight: 2976 oz

## 2021-04-29 LAB — GLUCOSE, CAPILLARY
Glucose-Capillary: 94 mg/dL (ref 70–99)
Glucose-Capillary: 125 mg/dL — ABNORMAL HIGH (ref 70–99)
Glucose-Capillary: 192 mg/dL — ABNORMAL HIGH (ref 70–99)
Glucose-Capillary: 184 mg/dL — ABNORMAL HIGH (ref 70–99)
Glucose-Capillary: 153 mg/dL — ABNORMAL HIGH (ref 70–99)

## 2021-04-29 LAB — COMPREHENSIVE METABOLIC PANEL
ALT: 164 U/L — ABNORMAL HIGH (ref 0–44)
AST: 128 U/L — ABNORMAL HIGH (ref 15–41)
Albumin: 2.5 g/dL — ABNORMAL LOW (ref 3.5–5.0)
Alkaline Phosphatase: 87 U/L (ref 38–126)
Anion gap: 10 (ref 5–15)
BUN: 92 mg/dL — ABNORMAL HIGH (ref 8–23)
CO2: 25 mmol/L (ref 22–32)
Calcium: 10.1 mg/dL (ref 8.9–10.3)
Chloride: 99 mmol/L (ref 98–111)
Creatinine, Ser: 11.42 mg/dL — ABNORMAL HIGH (ref 0.61–1.24)
GFR, Estimated: 4 mL/min — ABNORMAL LOW (ref >60)
Glucose, Bld: 96 mg/dL (ref 70–99)
Potassium: >7.5 mmol/L (ref 3.5–5.1)
Sodium: 134 mmol/L — ABNORMAL LOW (ref 135–145)
Total Bilirubin: 0.8 mg/dL (ref 0.3–1.2)
Total Protein: 6.4 g/dL — ABNORMAL LOW (ref 6.5–8.1)

## 2021-04-29 LAB — CBC
HCT: 44.3 % (ref 39.0–52.0)
HCT: 36.2 % — ABNORMAL LOW (ref 39.0–52.0)
Hemoglobin: 14.5 g/dL (ref 13.0–17.0)
Hemoglobin: 12.1 g/dL — ABNORMAL LOW (ref 13.0–17.0)
MCH: 26.2 pg (ref 26.0–34.0)
MCH: 27 pg (ref 26.0–34.0)
MCHC: 32.7 g/dL (ref 30.0–36.0)
MCHC: 33.4 g/dL (ref 30.0–36.0)
MCV: 80 fL (ref 80.0–100.0)
MCV: 80.8 fL (ref 80.0–100.0)
Platelets: 441 10*3/uL — ABNORMAL HIGH (ref 150–400)
Platelets: 326 10*3/uL (ref 150–400)
RBC: 5.54 MIL/uL (ref 4.22–5.81)
RBC: 4.48 MIL/uL (ref 4.22–5.81)
RDW: 15.7 % — ABNORMAL HIGH (ref 11.5–15.5)
RDW: 15.7 % — ABNORMAL HIGH (ref 11.5–15.5)
WBC: 5.4 10*3/uL (ref 4.0–10.5)
WBC: 6.2 10*3/uL (ref 4.0–10.5)
nRBC: 0 % (ref 0.0–0.2)
nRBC: 0 % (ref 0.0–0.2)

## 2021-04-29 LAB — BASIC METABOLIC PANEL
Anion gap: 8 (ref 5–15)
Anion gap: 11 (ref 5–15)
Anion gap: 8 (ref 5–15)
BUN: 88 mg/dL — ABNORMAL HIGH (ref 8–23)
BUN: 87 mg/dL — ABNORMAL HIGH (ref 8–23)
BUN: 72 mg/dL — ABNORMAL HIGH (ref 8–23)
CO2: 24 mmol/L (ref 22–32)
CO2: 25 mmol/L (ref 22–32)
CO2: 27 mmol/L (ref 22–32)
Calcium: 9.8 mg/dL (ref 8.9–10.3)
Calcium: 10.3 mg/dL (ref 8.9–10.3)
Calcium: 9.1 mg/dL (ref 8.9–10.3)
Chloride: 102 mmol/L (ref 98–111)
Chloride: 104 mmol/L (ref 98–111)
Chloride: 103 mmol/L (ref 98–111)
Creatinine, Ser: 10.4 mg/dL — ABNORMAL HIGH (ref 0.61–1.24)
Creatinine, Ser: 10.64 mg/dL — ABNORMAL HIGH (ref 0.61–1.24)
Creatinine, Ser: 8.57 mg/dL — ABNORMAL HIGH (ref 0.61–1.24)
GFR, Estimated: 5 mL/min — ABNORMAL LOW (ref >60)
GFR, Estimated: 5 mL/min — ABNORMAL LOW (ref >60)
GFR, Estimated: 6 mL/min — ABNORMAL LOW (ref >60)
Glucose, Bld: 149 mg/dL — ABNORMAL HIGH (ref 70–99)
Glucose, Bld: 119 mg/dL — ABNORMAL HIGH (ref 70–99)
Glucose, Bld: 195 mg/dL — ABNORMAL HIGH (ref 70–99)
Potassium: 7 mmol/L (ref 3.5–5.1)
Potassium: 7.2 mmol/L (ref 3.5–5.1)
Potassium: 4.7 mmol/L (ref 3.5–5.1)
Sodium: 134 mmol/L — ABNORMAL LOW (ref 135–145)
Sodium: 140 mmol/L (ref 135–145)
Sodium: 138 mmol/L (ref 135–145)

## 2021-04-29 LAB — URINALYSIS, ROUTINE W REFLEX MICROSCOPIC
Bilirubin Urine: NEGATIVE
Glucose, UA: NEGATIVE mg/dL
Hgb urine dipstick: NEGATIVE
Ketones, ur: NEGATIVE mg/dL
Leukocytes,Ua: NEGATIVE
Nitrite: NEGATIVE
Protein, ur: NEGATIVE mg/dL
Specific Gravity, Urine: 1.01 (ref 1.005–1.030)
pH: 6 (ref 5.0–8.0)

## 2021-04-29 LAB — LIPASE, BLOOD: Lipase: 29 U/L (ref 11–51)

## 2021-04-29 LAB — DIC (DISSEMINATED INTRAVASCULAR COAGULATION)PANEL
D-Dimer, Quant: 3.51 ug/mL-FEU — ABNORMAL HIGH (ref 0.00–0.50)
Fibrinogen: 421 mg/dL (ref 210–475)
INR: 1.3 — ABNORMAL HIGH (ref 0.8–1.2)
Platelets: 315 10*3/uL (ref 150–400)
Prothrombin Time: 16.3 seconds — ABNORMAL HIGH (ref 11.4–15.2)
Smear Review: NONE SEEN
aPTT: 40 seconds — ABNORMAL HIGH (ref 24–36)

## 2021-04-29 LAB — LACTIC ACID, PLASMA
Lactic Acid, Venous: 1.1 mmol/L (ref 0.5–1.9)
Lactic Acid, Venous: 2.2 mmol/L (ref 0.5–1.9)
Lactic Acid, Venous: 2.5 mmol/L (ref 0.5–1.9)
Lactic Acid, Venous: 2.5 mmol/L (ref 0.5–1.9)
Lactic Acid, Venous: 1.7 mmol/L (ref 0.5–1.9)

## 2021-04-29 LAB — T4, FREE: Free T4: 1.2 ng/dL — ABNORMAL HIGH (ref 0.61–1.12)

## 2021-04-29 LAB — MRSA NEXT GEN BY PCR, NASAL: MRSA by PCR Next Gen: DETECTED — AB

## 2021-04-29 LAB — CBG MONITORING, ED: Glucose-Capillary: 139 mg/dL — ABNORMAL HIGH (ref 70–99)

## 2021-04-29 LAB — CK: Total CK: 39 U/L — ABNORMAL LOW (ref 49–397)

## 2021-04-29 LAB — CORTISOL-AM, BLOOD: Cortisol - AM: 16 ug/dL (ref 6.7–22.6)

## 2021-04-29 LAB — TSH: TSH: 7.66 u[IU]/mL — ABNORMAL HIGH (ref 0.350–4.500)

## 2021-04-29 LAB — TROPONIN I (HIGH SENSITIVITY): Troponin I (High Sensitivity): 51 ng/L — ABNORMAL HIGH (ref <18)

## 2021-04-29 LAB — GLUCOSE, RANDOM: Glucose, Bld: 121 mg/dL — ABNORMAL HIGH (ref 70–99)

## 2021-04-29 MED ORDER — SODIUM CHLORIDE 0.9 % IV SOLN
2.0000 g | Freq: Two times a day (BID) | INTRAVENOUS | Status: DC
  Administered 2021-04-30: 2 g via INTRAVENOUS
  Filled 2021-04-29: qty 12.5

## 2021-04-29 MED ORDER — NOREPINEPHRINE 4 MG/250ML-% IV SOLN
2.0000 ug/min | INTRAVENOUS | Status: DC
  Administered 2021-04-29 – 2021-04-30 (×2): 2 ug/min via INTRAVENOUS
  Administered 2021-05-01 (×2): 5 ug/min via INTRAVENOUS
  Filled 2021-04-29 (×4): qty 250

## 2021-04-29 MED ORDER — CALCIUM CHLORIDE 10 % IV SOLN
1.0000 g | Freq: Once | INTRAVENOUS | Status: AC
Start: 2021-04-29 — End: 2021-04-29
  Administered 2021-04-29: 1 g via INTRAVENOUS

## 2021-04-29 MED ORDER — INSULIN ASPART 100 UNIT/ML IV SOLN
5.0000 [IU] | Freq: Once | INTRAVENOUS | Status: AC
  Administered 2021-04-29: 5 [IU] via INTRAVENOUS

## 2021-04-29 MED ORDER — SODIUM ZIRCONIUM CYCLOSILICATE 10 G PO PACK
10.0000 g | PACK | Freq: Once | ORAL | Status: AC
  Administered 2021-04-29: 10 g via ORAL
  Filled 2021-04-29: qty 1

## 2021-04-29 MED ORDER — SODIUM BICARBONATE 8.4 % IV SOLN
50.0000 meq | Freq: Once | INTRAVENOUS | Status: AC
  Administered 2021-04-29: 50 meq via INTRAVENOUS

## 2021-04-29 MED ORDER — SODIUM BICARBONATE 8.4 % IV SOLN
50.0000 meq | Freq: Once | INTRAVENOUS | Status: AC
Start: 2021-04-29 — End: 2021-04-29
  Administered 2021-04-29: 50 meq via INTRAVENOUS
  Filled 2021-04-29: qty 50

## 2021-04-29 MED ORDER — CALCIUM GLUCONATE-NACL 1-0.675 GM/50ML-% IV SOLN: 1.0000 g | Freq: Once | INTRAVENOUS | Status: DC

## 2021-04-29 MED ORDER — SODIUM CHLORIDE 0.9 % IV BOLUS: 1000.0000 mL | Freq: Once | INTRAVENOUS | Status: DC

## 2021-04-29 MED ORDER — PRISMASOL BGK 0/2.5 32-2.5 MEQ/L EC SOLN
Status: DC
  Filled 2021-04-29 (×2): qty 5000

## 2021-04-29 MED ORDER — SODIUM CHLORIDE 0.9 % IV BOLUS
2000.0000 mL | Freq: Once | INTRAVENOUS | Status: AC
Start: 2021-04-29 — End: 2021-04-29
  Administered 2021-04-29: 2000 mL via INTRAVENOUS

## 2021-04-29 MED ORDER — SODIUM CHLORIDE 0.9% FLUSH: 10.0000 mL | INTRAVENOUS | Status: DC | PRN

## 2021-04-29 MED ORDER — PRISMASOL BGK 0/2.5 32-2.5 MEQ/L EC SOLN
Status: DC
  Filled 2021-04-29 (×10): qty 5000

## 2021-04-29 MED ORDER — PRISMASOL BGK 0/2.5 32-2.5 MEQ/L REPLACEMENT SOLN
Status: DC
  Filled 2021-04-29 (×2): qty 5000

## 2021-04-29 MED ORDER — ORAL CARE MOUTH RINSE
15.0000 mL | Freq: Two times a day (BID) | OROMUCOSAL | Status: DC
  Administered 2021-04-29 – 2021-05-07 (×15): 15 mL via OROMUCOSAL

## 2021-04-29 MED ORDER — SODIUM ZIRCONIUM CYCLOSILICATE 10 G PO PACK: 10.0000 g | PACK | Freq: Once | ORAL | Status: DC

## 2021-04-29 MED ORDER — FUROSEMIDE 10 MG/ML IJ SOLN
100.0000 mg | Freq: Once | INTRAVENOUS | Status: AC
  Administered 2021-04-29: 100 mg via INTRAVENOUS
  Filled 2021-04-29: qty 10

## 2021-04-29 MED ORDER — LINEZOLID 600 MG/300ML IV SOLN
600.0000 mg | Freq: Two times a day (BID) | INTRAVENOUS | Status: DC
  Administered 2021-04-29 – 2021-04-30 (×3): 600 mg via INTRAVENOUS
  Filled 2021-04-29 (×4): qty 300

## 2021-04-29 MED ORDER — PRISMASOL BGK 0/2.5 32-2.5 MEQ/L EC SOLN
Status: DC
  Filled 2021-04-29 (×7): qty 5000

## 2021-04-29 MED ORDER — DEXTROSE 50 % IV SOLN
1.0000 | Freq: Once | INTRAVENOUS | Status: AC
  Administered 2021-04-29: 50 mL via INTRAVENOUS
  Filled 2021-04-29: qty 50

## 2021-04-29 MED ORDER — HEPARIN SODIUM (PORCINE) 5000 UNIT/ML IJ SOLN
5000.0000 [IU] | Freq: Three times a day (TID) | INTRAMUSCULAR | Status: DC
  Administered 2021-04-29 – 2021-05-07 (×24): 5000 [IU] via SUBCUTANEOUS
  Filled 2021-04-29 (×24): qty 1

## 2021-04-29 MED ORDER — CALCIUM GLUCONATE-NACL 1-0.675 GM/50ML-% IV SOLN
1.0000 g | Freq: Once | INTRAVENOUS | Status: AC
Start: 2021-04-29 — End: 2021-04-29
  Administered 2021-04-29: 1000 mg via INTRAVENOUS
  Filled 2021-04-29: qty 50

## 2021-04-29 MED ORDER — DOCUSATE SODIUM 100 MG PO CAPS: 100.0000 mg | ORAL_CAPSULE | Freq: Two times a day (BID) | ORAL | Status: DC | PRN

## 2021-04-29 MED ORDER — ALBUTEROL SULFATE (2.5 MG/3ML) 0.083% IN NEBU
10.0000 mg | INHALATION_SOLUTION | Freq: Once | RESPIRATORY_TRACT | Status: AC
  Administered 2021-04-29: 10 mg via RESPIRATORY_TRACT
  Filled 2021-04-29: qty 12

## 2021-04-29 MED ORDER — CALCIUM GLUCONATE-NACL 2-0.675 GM/100ML-% IV SOLN
2.0000 g | Freq: Once | INTRAVENOUS | Status: AC
  Administered 2021-04-29: 2000 mg via INTRAVENOUS
  Filled 2021-04-29: qty 100

## 2021-04-29 MED ORDER — SODIUM CHLORIDE 0.9% FLUSH
10.0000 mL | Freq: Two times a day (BID) | INTRAVENOUS | Status: DC
  Administered 2021-04-30 – 2021-05-02 (×4): 10 mL

## 2021-04-29 MED ORDER — CHLORHEXIDINE GLUCONATE CLOTH 2 % EX PADS
6.0000 | MEDICATED_PAD | Freq: Every day | CUTANEOUS | Status: DC
  Administered 2021-04-29 – 2021-05-04 (×6): 6 via TOPICAL

## 2021-04-29 MED ORDER — POLYETHYLENE GLYCOL 3350 17 G PO PACK: 17.0000 g | PACK | Freq: Every day | ORAL | Status: DC | PRN

## 2021-04-29 MED ORDER — DEXTROSE 50 % IV SOLN
1.0000 | Freq: Once | INTRAVENOUS | Status: AC
  Administered 2021-04-29: 50 mL via INTRAVENOUS

## 2021-04-29 MED ORDER — DEXTROSE 50 % IV SOLN
1.0000 | Freq: Once | INTRAVENOUS | Status: AC
Start: 2021-04-29 — End: 2021-04-29
  Administered 2021-04-29: 50 mL via INTRAVENOUS

## 2021-04-29 MED ORDER — DEXTROSE 5 % IV SOLN
INTRAVENOUS | Status: DC
  Filled 2021-04-29 (×2): qty 1000

## 2021-04-29 MED ORDER — HEPARIN SODIUM (PORCINE) 1000 UNIT/ML DIALYSIS
1000.0000 [IU] | INTRAMUSCULAR | Status: DC | PRN
  Administered 2021-04-29: 3000 [IU] via INTRAVENOUS_CENTRAL
  Administered 2021-05-01: 2800 [IU] via INTRAVENOUS_CENTRAL
  Filled 2021-04-29: qty 6
  Filled 2021-04-29: qty 3
  Filled 2021-04-29 (×3): qty 6

## 2021-04-29 MED ORDER — SODIUM CHLORIDE 0.9 % IV SOLN
250.0000 mL | INTRAVENOUS | Status: DC
  Administered 2021-04-29 – 2021-05-02 (×2): 250 mL via INTRAVENOUS

## 2021-04-29 MED ORDER — SODIUM CHLORIDE 0.9 % IV BOLUS
1000.0000 mL | Freq: Once | INTRAVENOUS | Status: AC
  Administered 2021-04-29: 1000 mL via INTRAVENOUS

## 2021-04-29 MED ORDER — CHLORHEXIDINE GLUCONATE 0.12 % MT SOLN
15.0000 mL | Freq: Two times a day (BID) | OROMUCOSAL | Status: DC
  Administered 2021-04-29 – 2021-05-07 (×16): 15 mL via OROMUCOSAL
  Filled 2021-04-29 (×12): qty 15

## 2021-04-29 MED ORDER — PRISMASOL BGK 0/2.5 32-2.5 MEQ/L EC SOLN
Status: DC
  Filled 2021-04-29 (×3): qty 5000

## 2021-04-29 MED ORDER — SODIUM BICARBONATE 8.4 % IV SOLN
100.0000 meq | Freq: Once | INTRAVENOUS | Status: AC
  Administered 2021-04-29: 100 meq via INTRAVENOUS
  Filled 2021-04-29: qty 100

## 2021-04-29 MED ORDER — SODIUM CHLORIDE 0.9 % IV SOLN
2.0000 g | INTRAVENOUS | Status: DC
  Administered 2021-04-29: 2 g via INTRAVENOUS
  Filled 2021-04-29: qty 12.5

## 2021-04-29 NOTE — Plan of Care (Signed)
?  Interdisciplinary Goals of Care Family Meeting ? ? ?Date carried out:: 04/29/2021 ? ?Location of the meeting: Phone conference ? ?Member's involved: Physician and Bedside Registered Nurse ? ?Durable Power of Attorney or acting medical decision maker: Son-- [name] [name] (not confirmed on paperwork yet)   ? ?Discussion: We discussed goals of care for [name] [name] .  Son was unaware of [place] DNR that had been signed in 2018. He wants all aggressive care and for him to be full code. I have requested to have him speak to his siblings to ensure no one was aware that the [facility] form had been signed in the past as this would have presumably been signed when [name] was able to make his own decisions. His sister is at bedside this afternoon. [name] has asked that we defer to [facility] after giving different answers to what he would want [place] to do if his heart stopped. To give family time to further discuss, code status has been changed to full code given the patient's request to defer to [facility]. RN present during discussion. ? ?Code status: Full Code ? ?Disposition: Continue current acute care ? ? ?Time spent for the meeting: 15 min. ? ?[name] [name] ?04/29/2021, 5:55 PM ? ?

## 2021-04-29 NOTE — H&P (Signed)
? ?NAME:  [name] [name], MRN:  [id number], DOB:  02/01/1945, LOS: 0 ?ADMISSION DATE:  04/29/2021 CONSULTATION DATE:  04/29/2021 ?REFERRING MD:  Sedonia Small - EDP CHIEF COMPLAINT:  Hyperkalemia, ARF  ? ?History of Present Illness:  ?76 year old man who presented to [facility] ED 4/20 with AMS x 2 days. Per SNF where patient resides, usually A&Ox4. Sent for outpatient labs which were abnormal. 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 ? ?On ED arrival, patient was drowsy, but appropriately answering questions. Denies fever, reports chills. Denies CP/SOB, n/v/d/ or changes in bowel habits. Denies decreased UOP or LUTS, denies abdominal pain. Endorses some confusion, dizziness and weakness. Labs notable for Na 134, K > 7.5, Cr 11.42 (baseline 1.3-1.5), transaminitis (AST 128, ALT 164). Lipase normal, CK 39. CBC grossly unremarkable. EKG with concern for hyperK-related changes and calcium/bicarb initiated. UA grossly negative; however, on physical exam patient's lower abdomen was significantly distended, raising concern for bladder outlet obstruction. Foley was placed with 2L dark UOP. Nephrology was consulted in the setting of severe hyperkalemia/ARF. ? ?PCCM consulted for ICU admission in the setting of severe ARF. ? ?Pertinent Medical History:  ? ?Past Medical History:  ?Diagnosis Date  ? Chronic systolic CHF (congestive heart failure) ([place])   ? CVA, old, aphasia   ? Diabetes ([place])   ? Hemiplegia ([place])   ? HTN (hypertension)   ? Intestinal obstruction ([place]) 11/2015  ? Peripheral vascular disease ([place])   ? Pneumonia   ? Stroke [facility])   ? ?Significant Hospital Events: ?Including procedures, antibiotic start and stop dates in addition to other pertinent events   ?4/20 - Presented from SNF with AMS x 1-2 days. Found to be hyperkalemic to > 7.5 with ARF (Cr 11, baseline 1.3-1.5). EKG changes and  bicarb/Ca given. C/f bladder outlet obstruction, Foley placed with 2L output. Nephro consulted. PCCM to admit. ? ?Interim History / Subjective:  ?PCCM consulted for admission ? ?Objective:  ?Blood pressure 130/79, pulse 66, temperature 98.1 ?F (36.7 ?C), temperature source Axillary, resp. rate 13, height '5\' 11"'  (1.803 m), weight 84.4 kg, SpO2 91 %. ?   ?   ?No intake or output data in the 24 hours ending 04/29/21 0442 ?Filed Weights  ? 04/29/21 0127  ?Weight: 84.4 kg  ? ?Physical Examination: ?General: Chronically ill-appearing elderly man in NAD. Mildly lethargic. ?HEENT: [place]/AT, anicteric sclera, PERRL, dry mucous membranes. ?Neuro: Drowsy/mildly lethargic but wakes readily to voice. Aphasic and slow to respond, but answering questions appropriately. Responds to verbal stimuli. Following commands consistently. Unilateral L-sided neglect noted. Strength 1/5 LUE/LLE, strength 3/5 RUE/RLE. Generalized weakness. ?CV: RRR, no m/g/r. ?PULM: Breathing even and unlabored on NRB. Lung fields diminished with faint bibasilar crackles. ?GI: Soft, nontender, nondistended. Normoactive bowel sounds. ?Extremities: LLE edema noted, trace RLE edema. Bilateral symmetric UE edema, mild pitting. Skin darkening/discoloration c/w PVD. ?Skin: Warm/dry, bilateral feet with significant callous/skin darkening 2/2 PVD. ? ?Resolved Hospital Problem List:  ? ? ?Assessment & Plan:  ?Acute renal failure/AKI 2/2 bladder outlet obstruction ?Severe hyperkalemia ?Presented to [facility] via SNF for AMS x 1-2 days. HyperK to > 7.5 on admission. Foley placed due to lower abdomen protuberance, 2L UOP drained.  ?- Admit to ICU for close monitoring/possible RRT, though not a good candidate overall ?- Nephro consult, appreciate assistance ?- Avoiding RRT at present, hopeful K will improve with relief of obstruction/shifting ?- Trend BMP daily, K Q6H  until normalized ?- Hyperkalemia protocol per Nephro ?- Lasix 112m x 1, further diuresis as clinically  appropriate ?- Monitor I&Os ?- Avoid nephrotoxic agents as able ?- Ensure adequate renal perfusion ? ?Altered mental status, presumed multifactorial ?Likely r/t severe AKI, history of CVA, possible med effect? ?- Mental status slowly improving ?- Trend LA ?- Hold gabapentin for now ?- Delirium precautions ? ?Transaminitis ?Mild transaminitis noted on admission CMP. AST 128, ALT 164. Normal Alk Phos/Tbili. ?- Hold amiodarone, Entresto for now ?- Trend LFTs ?- Consider RUQ [place] worsening/focal symptoms ? ?HFrEF ?History of Afib ?Echo 2019 with EF 20-25%, diffuse hypokinesis, grade II diastolic dysfunction. Home regimen includes: amiodarone, ASA/statin, Entresto, Lasix. ?- Amiodarone held in the setting of transaminitis ?- Resume Entresto as clinically appropriate ?- Diuresis as renal function/hemodynamics tolerate ?- F/u Echo ?- Cardiac monitoring ? ?Prior CVA with residual L-sided deficits ?Aphasia ?Dysphagia ?At risk for malnutrition ?History of stroke with residual L-sided deficits, generalized weakness and aphasia. Previously required PEG tube in the setting of dysphagia, improved. ?- SLP evaluation for swallowing ?- PT/OT as able ?- Nutrition consult to assess needs ? ?GOC ?- Confirmed wishes for DNR with patient ?- Gold form in chart ? ?Best Practice: (right click and "Reselect all SmartList Selections" daily)  ? ?Diet/type: NPO w/ oral meds ?DVT prophylaxis: prophylactic heparin  ?GI prophylaxis: PPI ?Lines: N/A ?Foley:  Yes, and it is still needed ?Code Status:  DNR ?Last date of multidisciplinary goals of care discussion [Attempted to call son 4/20AM, will reattempt. Gold DNR form present/in chart.] ? ?Labs:  ?CBC: ?Recent Labs  ?Lab 04/29/21 ?0150  ?WBC 5.4  ?HGB 14.5  ?HCT 44.3  ?MCV 80.0  ?PLT 441*  ? ? ?Basic Metabolic Panel: ?Recent Labs  ?Lab 04/29/21 ?0150  ?NA 134*  ?K >7.5*  ?CL 99  ?CO2 25  ?GLUCOSE 96  ?BUN 92*  ?CREATININE 11.42*  ?CALCIUM 10.1  ? ?GFR: ?Estimated Creatinine Clearance: 6 mL/min  (A) (by C-G formula based on SCr of 11.42 mg/dL (H)). ?Recent Labs  ?Lab 04/29/21 ?0150  ?WBC 5.4  ? ?Liver Function Tests: ?Recent Labs  ?Lab 04/29/21 ?0150  ?AST 128*  ?ALT 164*  ?ALKPHOS 87  ?BILITOT 0.8  ?PROT 6.4*  ?ALBUMIN 2.5*  ? ?Recent Labs  ?Lab 04/29/21 ?0150  ?LIPASE 29  ? ?No results for input(s): AMMONIA in the last 168 hours. ? ?ABG: ?No results found for: PHART, PCO2ART, PO2ART, HCO3, TCO2, ACIDBASEDEF, O2SAT  ? ?Coagulation Profile: ?No results for input(s): INR, PROTIME in the last 168 hours. ? ?Cardiac Enzymes: ?Recent Labs  ?Lab 04/29/21 ?0150  ?CKTOTAL 39*  ? ?HbA1C: ?No results found for: HGBA1C ? ?CBG: ?No results for input(s): GLUCAP in the last 168 hours. ? ?Review of Systems:   ?Review of systems completed with pertinent positives/negatives outlined in above HPI. ? ?Past Medical History:  ?He,  has a past medical history of Chronic systolic CHF (congestive heart failure) ([place], CVA, old, aphasia, Diabetes ([place], [place]ip[place]ia ([place], HTN (hypertension), Intestinal obstruction ([place] (11/2015), Peripheral vascular disease ([place], Pneumonia, and Stroke ([place].  ? ?Surgical History:  ? ?Past Surgical History:  ?Procedure Laterality Date  ? ABDOMINAL ADHESION SURGERY  11/2015  ? PERIPHERAL VASCULAR CATHETERIZATION    ? R-EIA stent, possibly R-Subclavian stent 2017  ?  ?Social History:  ? reports that he has quit smoking. He has never used smokeless tobacco. He reports that he does not drink alcohol and does not use drugs.  ? ?Family History:  ?His  family history is negative for CAD.  ? ?Allergies: ?No Known Allergies  ? ?Home Medications: ?Prior to Admission medications   ?Medication Sig Start Date End Date Taking? Authorizing Provider  ?acetaminophen (TYLENOL) 325 MG tablet Place 650 mg into feeding tube every 6 (six) hours as needed for mild pain.    [provider]  ?amiodarone (PACERONE) 200 MG tablet Take 200 mg by mouth daily.     [provider]  ?aspirin 81 MG chewable tablet  Chew 81 mg by mouth daily.     [provider]  ?atorvastatin (LIPITOR) 40 MG tablet Take 40 mg by mouth at bedtime.     [provider]  ?Cholecalciferol (VITAMIN D-3) 125 MCG (5000 UT) TABS Take 1 t

## 2021-04-29 NOTE — Plan of Care (Signed)
?  Interdisciplinary Goals of Care Family Meeting ? ? ?Date carried out: 04/29/2021 ? ?Location of the meeting: Phone conference ? ?Member's involved: Physician, Bedside Registered Nurse, and Family Member or next of kin ? ?Durable Power of Tour manager: sonDenyse [name] (still no paperwork, but patient has deferred decision to this son).   ? ?Discussion: We discussed goals of care for [name] [name] .  He has deferred his decision making to to his son, who has spoken to his siblings. They all agree he would want full aggressive care. They think that his 2nd wife may have been involved with his DNR in 2018, but they are getting divorced and the divorce will be finalized soon. They do not think Mr. [name] [name] was aware of his DNR status. ? ?Son is Media planner, but will be flying back to the [place] tonight. If he is unable to be reached, please call the patient's daughter [name] [name]. He understands that his father may require intubation overnight if his encephalopathy significantly worsens. ? ?Code status: Full Code ? ?Disposition: Continue current acute care ? ? ?Time spent for the meeting: 10 min. ? ?[name] [name] ?04/29/2021, 6:30 PM ? ?

## 2021-04-29 NOTE — Procedures (Signed)
Central Venous Catheter Insertion Procedure Note ? ?Perseus Westall  ?[id number]  ?Jun 10, 1945 ? ?Date:04/29/21  ?Time:5:53 PM  ? ?Provider Performing:Burnis Kaser Naomie Dean  ? ?Procedure: Insertion of Non-tunneled Central Venous Catheter(36556) with US guidance (91505)  ? ?Indication(s) ?Hemodialysis ? ?Consent ?Risks of the procedure as well as the alternatives and risks of each were explained to the patient and/or caregiver.  Consent for the procedure was obtained and is signed in the bedside chart ? ?Anesthesia ?Topical only with 1% lidocaine  ? ?Timeout ?Verified patient identification, verified procedure, site/side was marked, verified correct patient position, special equipment/implants available, medications/allergies/relevant history reviewed, required imaging and test results available. ? ?Sterile Technique ?Maximal sterile technique including full sterile barrier drape, hand hygiene, sterile gown, sterile gloves, mask, hair covering, sterile ultrasound probe cover (if used). ? ?Procedure Description ?Area of catheter insertion was cleaned with chlorhexidine and draped in sterile fashion.  With real-time ultrasound guidance a HD catheter was placed into the right internal jugular vein. Confirmed appropriate guidewire placement in collapsible vessel before dilation. Nonpulsatile blood flow and easy flushing noted in all ports.  The catheter was sutured in place and sterile dressing applied. ? ? ? ? ?Complications/Tolerance ?None; patient tolerated the procedure well. ?Chest X-ray is ordered to verify placement for internal jugular or subclavian cannulation.   Chest x-ray is not ordered for femoral cannulation. ? ?EBL ?Minimal ? ?Specimen(s) ?None ? ?Julian Hy, DO 04/29/21 5:53 PM ?[facility] ? ?

## 2021-04-29 NOTE — ED Notes (Signed)
Turned bair hugger down to lower seting ? ?

## 2021-04-29 NOTE — Progress Notes (Signed)
Called son to update him on his father's care. He has requested to speak to Nephrology regarding HD. I spoke to Dr. Royce Macadamia, who will call him. ? ?K+ and LA rising. Reviewing the chart, BPs have been dropping this afternoon. RN said this was due to cuff malpositioning but now they are remaining consistently low. ? ?Echo> LVEF 20-25%, severely dilated LV, mildly reduced RV function. Aortic sclerosis without stenosis. ? ?Plan: ?DIC panel, CBC ?CT abd pelvis  ?Serial LA ?Trop, EKG ?Peripheral NE; if he will be starting on HD will place HD catheter with pigtail for vasopressors. ? ?This patient is critically ill with multiple organ system failure which requires frequent high complexity decision making, assessment, support, evaluation, and titration of therapies. This was completed through the application of advanced monitoring technologies and extensive interpretation of multiple databases. During this encounter critical care time was devoted to patient care services described in this note for 25 minutes. ? ?Julian Hy, DO 04/29/21 4:32 PM ?[facility] ? ? ? ? ? ?

## 2021-04-29 NOTE — TOC Progression Note (Signed)
Transition of Care (TOC) - Progression Note  ? ? ?Patient Details  ?Name: [name] [name] ?MRN: [id number] ?Date of Birth: 04-01-1945 ? ?Transition of Care (TOC) CM/SW Contact  ?Angelita Ingles, RN ?Phone Number:[phone] ? ?04/29/2021, 2:18 PM ? ?Clinical Narrative:    ? ?Transition of Care (TOC) Screening Note ? ? ?Patient Details  ?Name: [name] [name] ?Date of Birth: 1945/07/02 ? ? ?Transition of Care (TOC) CM/SW Contact:    ?Angelita Ingles, RN ?Phone Number: ?04/29/2021, 2:18 PM ? ? ? ?Transition of Care Department [facility]) has reviewed patient and no TOC needs have been identified at this time. We will continue to monitor patient advancement through interdisciplinary progression rounds. If new patient transition needs arise, please place a TOC consult. ? ? ? ? ?  ?  ? ?Expected Discharge Plan and Services ?  ?  ?  ?  ?  ?                ?  ?  ?  ?  ?  ?  ?  ?  ?  ?  ? ? ?Social Determinants of Health (SDOH) Interventions ?  ? ?Readmission Risk Interventions ?   ? View : No data to display.  ?  ?  ?  ? ? ?

## 2021-04-29 NOTE — ED Provider Notes (Signed)
?[place] DEPT ?[facility] Emergency Department ?Provider Note ?MRN:  [id number]  ?Arrival date & time: 04/29/21    ? ?Chief Complaint   ?Altered Mental Status ?  ?History of Present Illness   ?Rockland Kotarski is a 76 y.o. year-old male with a history of CVA, CHF, diabetes presenting to the ED with chief complaint of altered mental status. ? ?Report of altered mental status for the past 1 or 2 days, less responsive.  Report of abnormal labs but unsure which ones.  Sent here from care facility. ? ?Review of Systems  ?I was unable to obtain a full/accurate HPI, PMH, or ROS due to the patient's altered mental status, slurred speech. ? ?Patient's Health History   ? ?Past Medical History:  ?Diagnosis Date  ? Chronic systolic CHF (congestive heart failure) ([place])   ? CVA, old, aphasia   ? Diabetes ([place])   ? Hemiplegia ([place])   ? HTN (hypertension)   ? Intestinal obstruction ([place]) 11/2015  ? Peripheral vascular disease ([place])   ? Pneumonia   ? Stroke [facility])   ?  ?Past Surgical History:  ?Procedure Laterality Date  ? ABDOMINAL ADHESION SURGERY  11/2015  ? PERIPHERAL VASCULAR CATHETERIZATION    ? R-EIA stent, possibly R-Subclavian stent 2017  ?  ?Family History  ?Problem Relation Age of Onset  ? CAD Neg Hx   ?  ?Social History  ? ?Socioeconomic History  ? Marital status: Married  ?  Spouse name: Not on file  ? Number of children: Not on file  ? Years of education: Not on file  ? Highest education level: Not on file  ?Occupational History  ? Occupation: Retired  ?Tobacco Use  ? Smoking status: Former  ? Smokeless tobacco: Never  ?Vaping Use  ? Vaping Use: Never used  ?Substance and Sexual Activity  ? Alcohol use: No  ? Drug use: No  ? Sexual activity: Never  ?Other Topics Concern  ? Not on file  ?Social History Narrative  ? Pt lives in a facility.   ? ?Social Determinants of Health  ? ?Financial Resource Strain: Not on file  ?Food Insecurity: Not on file  ?Transportation Needs: Not on file  ?Physical Activity: Not on  file  ?Stress: Not on file  ?Social Connections: Not on file  ?Intimate Partner Violence: Not on file  ?  ? ?Physical Exam  ? ?Vitals:  ? 04/29/21 0315 04/29/21 0330  ?BP: 129/70 130/79  ?Pulse: 65 66  ?Resp: 14 13  ?Temp:    ?SpO2: 91% 91%  ?  ?CONSTITUTIONAL: Chronically ill-appearing, NAD ?NEURO/PSYCH: Somnolent but wakes to voice, answers yes and no questions, left hemiplegia, slurred speech ?EYES:  eyes equal and reactive ?ENT/NECK:  no LAD, no JVD ?CARDIO: Regular rate, well-perfused, normal S1 and S2 ?PULM:  CTAB no wheezing or rhonchi ?GI/GU:  non-distended, non-tender ?MSK/SPINE:  No gross deformities, no edema ?SKIN:  no rash, atraumatic ? ? ?*Additional and/or pertinent findings included in MDM below ? ?Diagnostic and Interventional Summary  ? ? EKG Interpretation ? ?Date/Time:  04/29/2021 at 4: 25: 13 ?Ventricular Rate:   66 ?PR Interval:   305 ?QRS Duration:  235 ?QT Interval:  534  ?QTC Calculation:  560 ?R Axis:     ?Text Interpretation: Sinus rhythm, wide QRS concerning for hyperkalemia ?  ? ?  ? ? EKG Interpretation ? ?Date/Time:  04/29/2021 at 4: 29: 40 ?Ventricular Rate:   69 ?PR Interval:   249 ?QRS Duration:  171 ?QT Interval:  471  ?  QTC Calculation:  505 ?R Axis:     ?Text Interpretation: Sinus rhythm, improvement of QRS after calcium and bicarb administration ?Confirmed with Dr. Gerlene Fee at 4:45 AM ?  ? ?  ? ? ?Labs Reviewed  ?CBC - Abnormal; Notable for the following components:  ?    Result Value  ? RDW 15.7 (*)   ? Platelets 441 (*)   ? All other components within normal limits  ?COMPREHENSIVE METABOLIC PANEL - Abnormal; Notable for the following components:  ? Sodium 134 (*)   ? Potassium >7.5 (*)   ? BUN 92 (*)   ? Creatinine, Ser 11.42 (*)   ? Total Protein 6.4 (*)   ? Albumin 2.5 (*)   ? AST 128 (*)   ? ALT 164 (*)   ? GFR, Estimated 4 (*)   ? All other components within normal limits  ?CK - Abnormal; Notable for the following components:  ? Total CK 39 (*)   ? All other components  within normal limits  ?LIPASE, BLOOD  ?URINALYSIS, ROUTINE W REFLEX MICROSCOPIC  ?  ?No orders to display  ?  ?Medications  ?sodium zirconium cyclosilicate (LOKELMA) packet 10 g (has no administration in time range)  ?sodium chloride 0.9 % bolus 2,000 mL (has no administration in time range)  ?furosemide (LASIX) 100 mg in dextrose 5 % 50 mL IVPB (has no administration in time range)  ?calcium gluconate 1 g/ 50 mL sodium chloride IVPB (has no administration in time range)  ?insulin aspart (novoLOG) injection 5 Units (5 Units Intravenous Given 04/29/21 0432)  ?  And  ?dextrose 50 % solution 50 mL (50 mLs Intravenous Given 04/29/21 0433)  ?sodium bicarbonate injection 50 mEq (50 mEq Intravenous Given 04/29/21 0430)  ?calcium chloride injection 1 g (1 g Intravenous Given 04/29/21 0437)  ?  ? ?Procedures  /  Critical Care ?.Critical Care ?Performed by: Maudie Flakes, MD ?Authorized by: Maudie Flakes, MD  ? ?Critical care provider statement:  ?  Critical care time (minutes):  39 ?  Critical care was necessary to treat or prevent imminent or life-threatening deterioration of the following conditions:  Metabolic crisis and renal failure ?  Critical care was time spent personally by me on the following activities:  Development of treatment plan with patient or surrogate, discussions with consultants, evaluation of patient's response to treatment, examination of patient, ordering and review of laboratory studies, ordering and review of radiographic studies, ordering and performing treatments and interventions, pulse oximetry, re-evaluation of patient's condition and review of old charts ? ?ED Course and Medical Decision Making  ?Initial Impression and Ddx ?Patient sent here for some increased somnolence and abnormal lab.  Resting comfortably with normal vital signs, wakes easily.  Awaiting laboratory assessment.  DDx includes UTI, metabolic disturbance, electrolyte disturbance, ? ?Past medical/surgical history that increases  complexity of ED encounter: History of stroke ? ?Interpretation of Diagnostics ?I personally reviewed the EKG and my interpretation is as follows: Severely widened QRS concerning for hyperkalemia ?   ?Labs returned with a creatinine of 11, BUN of 98, concerning for acute renal failure.  ? ?Patient Reassessment and Ultimate Disposition/Management ? Patient with protuberant lower abdomen and so concern for bladder outlet obstruction causing acute renal failure.  Foley catheter placed with removal of 2 L of dark urine.  The potassium level was delayed and eventually came back at greater than 7.  There was a delay in obtaining EKG on patient's arrival, quickly obtained after the  potassium level was found to be elevated.  EKG as described above, very concerning for hyperkalemia related changes.  Patient immediately given IV calcium and bicarbonate with rapid improvement of QRS duration. ? ?Case discussed with Dr. Johnney Ou of nephrology, recommending fluids, Lasix 100 mg, continued hyperkalemia treatment.  Patient should be able to avoid dialysis.  To be admitted to the ICU.  Patient is DNR. ? ?Patient management required discussion with the following services or consulting groups:  Intensivist Service and Nephrology ? ?Complexity of Problems Addressed ?Acute illness or injury that poses threat of life of bodily function ? ?Additional Data Reviewed and Analyzed ?Further history obtained from: ?EMS on arrival ? ?Additional Factors Impacting ED Encounter Risk ?Consideration of hospitalization ? ?Barth Kirks. Sedonia Small, MD ?[facility] ?[email or web address] ? ?Final Clinical Impressions(s) / ED Diagnoses  ? ?  ICD-10-CM   ?1. Acute renal failure, unspecified acute renal failure type ([place])  N17.9   ?  ?2. Urinary retention  R33.9   ?  ?3. Uremia  N19   ?  ?4. Altered mental status, unspecified altered mental status type  R41.82   ?  ?5. Hyperkalemia  E87.5   ?  ?  ?ED Discharge Orders    ? ? None  ? ?  ?  ? ?Discharge Instructions Discussed with and Provided to Patient:  ? ?Discharge Instructions   ?None ?  ? ?  ?Maudie Flakes, MD ?04/29/21 [phone] ? ?

## 2021-04-29 NOTE — Procedures (Signed)
Arterial Catheter Insertion Procedure Note ? ?[name] [name]  ?[id number]  ?[birth year] ? ?Date:04/29/21  ?Time:6:18 PM  ? ? ?Provider Performing: Julian Hy  ? ? ?Procedure: Insertion of Arterial Line [phone]) with US guidance (80034)  ? ?Indication(s) ?Blood pressure monitoring and/or need for frequent ABGs ? ?Consent ?Risks of the procedure as well as the alternatives and risks of each were explained to the patient and/or caregiver.  Consent for the procedure was obtained and is signed in the bedside chart ? ?Anesthesia ?None ? ? ?Time Out ?Verified patient identification, verified procedure, site/side was marked, verified correct patient position, special equipment/implants available, medications/allergies/relevant history reviewed, required imaging and test results available. ? ? ?Sterile Technique ?Maximal sterile technique including full sterile barrier drape, hand hygiene, sterile gown, sterile gloves, mask, hair covering, sterile ultrasound probe cover (if used). ? ? ?Procedure Description ?Area of catheter insertion was cleaned with chlorhexidine and draped in sterile fashion. With real-time ultrasound guidance an arterial catheter was placed into the left radial artery.  Appropriate arterial tracings confirmed on monitor.   ? ? ?Left radial artery ? ? ? ?Complications/Tolerance ?None; patient tolerated the procedure well. ? ? ?EBL ?Minimal ? ? ?Specimen(s) ?None ? ?Julian Hy, DO 04/29/21 6:18 PM ?[facility] ? ?

## 2021-04-29 NOTE — Consult Note (Signed)
[facility]  ?INPATIENT CONSULTATION ? ?Reason for Consultation: hyperkalemia, AKI ?Requesting Provider: Dr. Sedonia Small, [facility] ED ? ?HPI: [name] [name] is an 76 y.o. male with h/o CVA, DM, HFrEF, pAFib who presented to the ED yesterday with AMS and was found to have AKI and hyperkalemia for which nephrology is consulted for evaluation and management.  ? ?Pt with h/o CVA and report of aphasia + AMS thus history mainly taken from chart, MD and RN.  SNF noted AMS x several days and checked labs yesterday.  Report of abnormal labs and pt dispatched to ED though labs didn't accompany patient.  On arrival normotensive, HR 70s, afebrile, 92% on RA.  Lab showed Na 134, K > 7.5, Bicarb 25, BUN 92, Cr 11.4, ALb 2.5, AST 128, ALT 162, CK 39, normal CBC.  UA 1.010, neg protein and blood.  EKG reported wide QRS, given insulin, dextrose, IV ca, IV bicarb and QRS narrowed.  Abd noted to be distended and foley placed which drained 1.2L immediately and RN reports another 0.9L since then as well.   ? ?On review of chart pt DNR/DNI and has routine visits with Palliative care for symptom management but doesn't appear to actively be on hospice.  ? ?PMH: ?Past Medical History:  ?Diagnosis Date  ? Chronic systolic CHF (congestive heart failure) ([place])   ? CVA, old, aphasia   ? Diabetes ([place])   ? Hemiplegia ([place])   ? HTN (hypertension)   ? Intestinal obstruction ([place]) 11/2015  ? Peripheral vascular disease ([place])   ? Pneumonia   ? Stroke [facility])   ? ?PSH: ?Past Surgical History:  ?Procedure Laterality Date  ? ABDOMINAL ADHESION SURGERY  11/2015  ? PERIPHERAL VASCULAR CATHETERIZATION    ? R-EIA stent, possibly R-Subclavian stent 2017  ? ? ?Past Medical History:  ?Diagnosis Date  ? Chronic systolic CHF (congestive heart failure) ([place])   ? CVA, old, aphasia   ? Diabetes ([place])   ? Hemiplegia ([place])   ? HTN (hypertension)   ? Intestinal obstruction ([place]) 11/2015  ? Peripheral vascular disease ([place])   ? Pneumonia   ? Stroke [facility])    ? ? ?Medications: ? I have reviewed the patient's current medications. ? ?(Not in a hospital admission) ? ? ?ALLERGIES: ? No Known Allergies ? ?FAM HX: ?Family History  ?Problem Relation Age of Onset  ? CAD Neg Hx   ? ? ?Social History:  ? reports that he has quit smoking. He has never used smokeless tobacco. He reports that he does not drink alcohol and does not use drugs. ? ?ROS: unable to obtain due to patient factors ? ?Blood pressure 129/68, pulse 72, temperature (!) 95.6 ?F (35.3 ?C), resp. rate 14, height '5\' 11"'$  (1.803 m), weight 84.4 kg, SpO2 99 %. ?PHYSICAL EXAM: ?Gen: lying at 30 degrees in bed, drowsy but arousable  ?Eyes: anicteric ?ENT: dry with pt mouth breathing on facial mask supplemental O2 ?Neck: flat JVP ?CV: RRR, no rub, narrow complex QRS on monitor ?Abd: soft, nontender, nondistended ?Lungs: normal WOB, clear ?GU: foley draining amber urine ?Extr:  L arm and leg hemiparesis with mild dependent edema, no R edema ?Neuro:  dysarthric but able to answer some simple questions ?Skin: cool and dry ?  ?Results for orders placed or performed during the hospital encounter of 04/29/21 (from the past 48 hour(s))  ?CBC     Status: Abnormal  ? Collection Time: 04/29/21  1:50 AM  ?Result Value Ref Range  ? WBC 5.4 4.0 -  10.5 K/uL  ? RBC 5.54 4.22 - 5.81 MIL/uL  ? Hemoglobin 14.5 13.0 - 17.0 g/dL  ? HCT 44.3 39.0 - 52.0 %  ? MCV 80.0 80.0 - 100.0 fL  ? MCH 26.2 26.0 - 34.0 pg  ? MCHC 32.7 30.0 - 36.0 g/dL  ? RDW 15.7 (H) 11.5 - 15.5 %  ? Platelets 441 (H) 150 - 400 K/uL  ? nRBC 0.0 0.0 - 0.2 %  ?  Comment: Performed at [facility], [place] [address] [place], [place] [address]  ?Comprehensive metabolic panel     Status: Abnormal  ? Collection Time: 04/29/21  1:50 AM  ?Result Value Ref Range  ? Sodium 134 (L) 135 - 145 mmol/L  ? Potassium >7.5 (HH) 3.5 - 5.1 mmol/L  ?  Comment: NO VISIBLE HEMOLYSIS ?CRITICAL RESULT CALLED TO, READ BACK BY AND VERIFIED WITH: ?Hulen Luster, RN 04/29/21 0420 J. COLE ?  ? Chloride  99 98 - 111 mmol/L  ? CO2 25 22 - 32 mmol/L  ? Glucose, Bld 96 70 - 99 mg/dL  ?  Comment: Glucose reference range applies only to samples taken after fasting for at least 8 hours.  ? BUN 92 (H) 8 - 23 mg/dL  ? Creatinine, Ser 11.42 (H) 0.61 - 1.24 mg/dL  ? Calcium 10.1 8.9 - 10.3 mg/dL  ? Total Protein 6.4 (L) 6.5 - 8.1 g/dL  ? Albumin 2.5 (L) 3.5 - 5.0 g/dL  ? AST 128 (H) 15 - 41 U/L  ? ALT 164 (H) 0 - 44 U/L  ? Alkaline Phosphatase 87 38 - 126 U/L  ? Total Bilirubin 0.8 0.3 - 1.2 mg/dL  ? GFR, Estimated 4 (L) >60 mL/min  ?  Comment: (NOTE) ?Calculated using the CKD-EPI Creatinine Equation (2021) ?  ? Anion gap 10 5 - 15  ?  Comment: Performed at [facility], [place] [address] [place], [place] [address]  ?Lipase, blood     Status: None  ? Collection Time: 04/29/21  1:50 AM  ?Result Value Ref Range  ? Lipase 29 11 - 51 U/L  ?  Comment: Performed at [facility], [place] [address] [place], [place] [address]  ?CK     Status: Abnormal  ? Collection Time: 04/29/21  1:50 AM  ?Result Value Ref Range  ? Total CK 39 (L) 49 - 397 U/L  ?  Comment: Performed at [facility], [place] [address], [place], [place] [address]  ?Urinalysis, Routine w reflex microscopic Urine, Catheterized     Status: Abnormal  ? Collection Time: 04/29/21  5:05 AM  ?Result Value Ref Range  ? Color, Urine AMBER (A) YELLOW  ?  Comment: BIOCHEMICALS MAY BE AFFECTED BY COLOR  ? APPearance CLEAR CLEAR  ? Specific Gravity, Urine 1.010 1.005 - 1.030  ? pH 6.0 5.0 - 8.0  ? Glucose, UA NEGATIVE NEGATIVE mg/dL  ? Hgb urine dipstick NEGATIVE NEGATIVE  ? Bilirubin Urine NEGATIVE NEGATIVE  ? Ketones, ur NEGATIVE NEGATIVE mg/dL  ? Protein, ur NEGATIVE NEGATIVE mg/dL  ? Nitrite NEGATIVE NEGATIVE  ? Leukocytes,Ua NEGATIVE NEGATIVE  ?  Comment: Performed at [facility], [place] [address] [place], [place] [address]  ? ? ?No results found. ? ?Assessment/Plan ? AKI, severe:  suspect this is obstructive in etiology given report of > 2L of urine outpt when  foley inserted.  Imaging pending.  Follow serial labs.  Not a candidate for dialysis in light of comorbids  and functional status.  Hold entresto. Strict I/Os.  ?Hyperkalemia, severe: secondary to #1 and should improve now that BOO relieved.  Rec'd na bicarb, IV ca, insulin, glucose.  Also give 1L NS boluses and lasix 100 IV to promote K excretion. Further fluids/diuretics as clinically indicated; caution with h/o severe CHF. Lokelma was ordered but given AMS has not been given.  D/w RN give lokelma if safe to do so.   Repeat K was ordered for now and q6h.  ?AMS: suspect multifactorial with h/o CVA, severe AKI, gabapentin - hold.  further w/u per primary ?HFrEF: 2019 TTE with EF 20-25% with grade 2 DD.  Caution with IV fluids.  Hold entresto w AKI. ?H/o CVA, L hemiparesis, report of h/o aphasia: hold gabapentin with AKI ?A fib: on amiodarone ? ?Will follow, call with concerns.  ? ?Justin Mend ?04/29/2021, 5:21 AM ? ? ? ?

## 2021-04-29 NOTE — Plan of Care (Addendum)
Spoke with the patient's son, his power of attorney (Dr. Lamarr Lulas at [phone]) ? ?Patient with hyperkalemia and AKI now with hypotension.  Initially it was felt he would improve with foley catheter placement.  He has required acute continuous dialysis before per his son.  They are requesting dialysis.  Initially today he made urine after placement of a foley.  K still up.  Again now with hypotension and team is initiating a pressor.   ? ?Discussed the risks/benefits/indications for renal replacement therapy.    ? ?The patient is normally alert and oriented x 4 per his nurse and his son.  He does live at [facility] and needs assistance there   ? ?He is a marginal candidate for long-term hemodialysis.  However he is acutely decompensating and I am concerned that he will expire without renal replacement therapy ? ?Start CRRT ?0K fluids to start ?Trend potassium  ?For now keep even as tolerated ?Stop bicarb gtt once start CRRT ?Spoke with primary team and they are assessing his abdomen and other causes of emergent hyperkalemia from conditions such as ischemia ? ?Claudia Desanctis, MD ?4:55 PM ?04/29/2021 ? ?

## 2021-04-29 NOTE — Progress Notes (Signed)
Foley flushed at 1930 with 30ms of NS. Foley catheter immediately started to drain yellow colored urine then turned red/bloody with small amount of blood clots. 15051m of urine drained from foley/bladder. ?

## 2021-04-29 NOTE — Progress Notes (Signed)
[name] [name] was seen in the ED this morning. He denies complaints but does not know where he is. His speech is dysarthric making it challenging to obtain additional history. ? ?BP 115/69   Pulse 73   Temp (!) 97.2 ?F (36.2 ?C)   Resp 13   Ht '5\' 11"'$  (1.803 m)   Wt 84.4 kg   SpO2 99%   BMI 25.94 kg/m?  ?Chronically ill appearing man lying in bed in NAD under bear hugger ?[place]/AT, eyes anicteric ?Breathing comfortably on NRB, no wheezing or rhonchi ?S1S2, RRR ?Abd soft, NT ?+LE edema, no cyanosis.  ?Arousable to verbal stimulation, dysarthric speech, globally weak. ? ?Last BMP 1AM with K+ >7.5, BUN 92, Cr 11.42 ? ?UA: negative for leukocytes ? ?Renal US: moderately distended bladder with foley, unable to visualize L kidney, R medical renal disease and cysts. ? ?Assessment & plan: ?Acute encephalopathy 2/2 renal failure ?Hyperkalemia ?AKI on CKD 2 ?Hypothermia, no obvious source of infection ?Hyponatremia ?Chronic HFrEF ? ?-STAT BMP ?-con't holding Entresto ?-lasix + IVF; cautious to avoid exacerbating heart failure ?-Appreciate Nephrology's management. Not a candidate for RRT due to baseline comorbidities. ?-Check TSH, free T4, cortisol. Cont' bear hugger.  ?-Can give lokelma if mental status improves enough for safe swallowing. ?-SLP consult ? ?This patient is critically ill with multiple organ system failure which requires frequent high complexity decision making, assessment, support, evaluation, and titration of therapies. This was completed through the application of advanced monitoring technologies and extensive interpretation of multiple databases. During this encounter critical care time was devoted to patient care services described in this note for 25 minutes. ? ?Julian Hy, DO 04/29/21 8:24 AM ?[facility] ? ?

## 2021-04-29 NOTE — ED Triage Notes (Signed)
?  Patient BIB EMS for AMS that started earlier yesterday.  Patient started having altered mental status and labs were drawn and sent on patient.  Lab work was called back to SNF and facility called EMS due to some labs being abnormal.  Facility did not state which labs were abnormal.  States patient is normally A&O x4.  Patient responsive to verbal stimuli and can squeeze hand to answer questions.  Patient lethargic and sleeping on arrival.  VSS ?

## 2021-04-29 NOTE — Progress Notes (Signed)
PHARMACY NOTE:  ANTIMICROBIAL RENAL DOSAGE ADJUSTMENT ? ?Current antimicrobial regimen includes a mismatch between antimicrobial dosage and estimated renal function.  As per policy approved by the Pharmacy & Therapeutics and Medical Executive Committees, the antimicrobial dosage will be adjusted accordingly. ? ?Current antimicrobial dosage:  cefepime 2g IV q24h ? ?Indication: sepsis - unknown source ? ?Renal Function: ?'[x]'$      On CRRT ?   ?Antimicrobial dosage has been changed to:  Once CRRT starts, change cefepime to 2g IV q12h ? ?Additional comments: ? ? ?Arturo Morton, PharmD, BCPS ?Please check AMION for all [facility] contact numbers ?Clinical Pharmacist ?04/29/2021 4:59 PM ?

## 2021-04-29 NOTE — Progress Notes (Signed)
K+ remains high today despite additionally shifting K+ this morning and giving lasix & lokelma. Additional shifting this afternoon.  Due to rise in LA and hypothermia, will empirically start antibiotics after blood cultures have been drawn. ? ?I updated his daughter Dr. [name] (professor) and attempted to call his son Dr. Lamarr Lulas (physician) x 2 via phone. I was not able to leave a message for his son.  ? ?Mr. Maready daughter indicated that he has had recurrent pneumonias that he has been treated for recently. ? ?Julian Hy, DO 04/29/21 1:31 PM ? Pulmonary & Critical Care ? ?

## 2021-04-29 NOTE — Evaluation (Signed)
Clinical/Bedside Swallow Evaluation ?Patient Details  ?Name: [name] [name] ?MRN: [id number] ?Date of Birth: 03-28-1945 ? ?Today's Date: 04/29/2021 ?Time: SLP Start Time (ACUTE ONLY): 8341 SLP Stop Time (ACUTE ONLY): 1010 ?SLP Time Calculation (min) (ACUTE ONLY): 20 min ? ?Past Medical History:  ?Past Medical History:  ?Diagnosis Date  ? Chronic systolic CHF (congestive heart failure) ([place])   ? CVA, old, aphasia   ? Diabetes ([place])   ? Hemiplegia ([place])   ? HTN (hypertension)   ? Intestinal obstruction ([place]) 11/2015  ? Peripheral vascular disease ([place])   ? Pneumonia   ? Stroke [facility])   ? ?Past Surgical History:  ?Past Surgical History:  ?Procedure Laterality Date  ? ABDOMINAL ADHESION SURGERY  11/2015  ? PERIPHERAL VASCULAR CATHETERIZATION    ? R-EIA stent, possibly R-Subclavian stent 2017  ? ?HPI:  ?76 year old man who presented to [facility] ED 4/20 with AMS x 2 days. Per SNF where patient resides, usually A&Ox4. Sent for outpatient labs which were abnormal. Pt has a history of oral dysphagia at baseline though per notes in 2021 pt is able to compensate for oral dysphagia, uses liquid washes and has not required texture modification in prior admissions since recovery from initial CVA. 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  ?  ?Assessment / Plan / Recommendation  ?Clinical Impression ? The pt was seen for clinical swallow evaluation. Oral mech exam revealed that the pt had asymmetry. L facial droop. Lingual ROM and strength was WNL. SLP facilitated straw sips of thin liquid, and pt tolerated consecutive sips with no overt s/s of aspiration. SLP branched up to puree applesauce and pt demonstrated mild oral holding, however he independently resolved and cleared oral cavity. SLP then presented regular graham cracker, and pt demonstrated effective lateralization and mastication of graham  cracker. Pt verbalized that he typically feeds himself. SLP observed pt use R hand to feed himself graham cracker and straw sips of water. Pt had increased difficulty coordinating hand to mouth, thus necessitiating assist at mealtime. SLP recommends initiating dysphagia 3 diet with thin liquids at this time. Pt may receieve medications whole with liquid. Pt appears to be at prior level of functioning. ?SLP Visit Diagnosis: Dysphagia, unspecified (R13.10) ?   ?Aspiration Risk ? No limitations  ?  ?Diet Recommendation Dysphagia 3 (Mech soft);Thin liquid  ? ?Liquid Administration via: Straw ?Medication Administration: Whole meds with liquid ?Supervision: Staff to assist with self feeding ?Compensations: Minimize environmental distractions;Slow rate;Small sips/bites ?Postural Changes: Seated upright at 90 degrees  ?  ?Other  Recommendations Oral Care Recommendations: Oral care BID   ? ?Recommendations for follow up therapy are one component of a multi-disciplinary discharge planning process, led by the attending physician.  Recommendations may be updated based on patient status, additional functional criteria and insurance authorization. ? ?Follow up Recommendations No SLP follow up  ? ? ?  ?Assistance Recommended at Discharge PRN  ?Functional Status Assessment Patient has had a recent decline in their functional status and demonstrates the ability to make significant improvements in function in a reasonable and predictable amount of time.  ?Frequency and Duration min 2x/week  ?2 weeks ?  ?   ? ?Prognosis Prognosis for Safe Diet Advancement: Good ?Barriers to Reach Goals: Severity of deficits  ? ?  ? ?Swallow Study   ?General HPI: 76 year old man who presented to [facility] ED 4/20 with AMS x 2 days.  Per SNF where patient resides, usually A&Ox4. Sent for outpatient labs which were abnormal. Pt has a history of oral dysphagia at baseline though per notes in 2021 pt is able to compensate for oral dysphagia, uses liquid washes  and has not required texture modification in prior admissions since recovery from initial CVA. 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 ?Type of Study: Bedside Swallow Evaluation ?Previous Swallow Assessment: BSE 2021 (Oral Dysphagia) ?Diet Prior to this Study: Regular;Thin liquids ?Temperature Spikes Noted: No ?Respiratory Status: Room air ?History of Recent Intubation: No ?Behavior/Cognition: Alert;Cooperative;Pleasant mood ?Oral Cavity Assessment: Within Functional Limits ?Oral Care Completed by SLP: No ?Oral Cavity - Dentition: Adequate natural dentition ?Vision: Functional for self-feeding ?Self-Feeding Abilities: Needs assist ?Patient Positioning: Upright in bed ?Baseline Vocal Quality: Normal  ?  ?Oral/Motor/Sensory Function Overall Oral Motor/Sensory Function: Mild impairment ?Facial ROM: Reduced left ?Facial Symmetry: Abnormal symmetry left ?Lingual ROM: Within Functional Limits ?Lingual Strength: Within Functional Limits   ?Ice Chips Ice chips: Not tested   ?Thin Liquid Thin Liquid: Within functional limits ?Presentation: Straw;Self Fed  ?  ?Nectar Thick Nectar Thick Liquid: Not tested   ?Honey Thick Honey Thick Liquid: Not tested   ?Puree Puree: Within functional limits ?Presentation: Self Fed;Spoon   ?Solid ? ? ?  Solid: Within functional limits ?Presentation: Self Fed  ? ?  ? ?Vaughan Sine ?04/29/2021,10:21 AM ? ? ? ?

## 2021-04-30 ENCOUNTER — Inpatient Hospital Stay (HOSPITAL_COMMUNITY): Payer: Medicare Other

## 2021-04-30 DIAGNOSIS — N179 Acute kidney failure, unspecified: Secondary | ICD-10-CM | POA: Diagnosis not present

## 2021-04-30 DIAGNOSIS — J9601 Acute respiratory failure with hypoxia: Secondary | ICD-10-CM

## 2021-04-30 DIAGNOSIS — I5020 Unspecified systolic (congestive) heart failure: Secondary | ICD-10-CM

## 2021-04-30 DIAGNOSIS — E875 Hyperkalemia: Secondary | ICD-10-CM | POA: Diagnosis not present

## 2021-04-30 DIAGNOSIS — Z789 Other specified health status: Secondary | ICD-10-CM

## 2021-04-30 DIAGNOSIS — R4182 Altered mental status, unspecified: Secondary | ICD-10-CM

## 2021-04-30 DIAGNOSIS — Z515 Encounter for palliative care: Secondary | ICD-10-CM | POA: Diagnosis not present

## 2021-04-30 DIAGNOSIS — J90 Pleural effusion, not elsewhere classified: Secondary | ICD-10-CM

## 2021-04-30 LAB — RENAL FUNCTION PANEL
Albumin: 2.1 g/dL — ABNORMAL LOW (ref 3.5–5.0)
Anion gap: 7 (ref 5–15)
BUN: 22 mg/dL (ref 8–23)
CO2: 27 mmol/L (ref 22–32)
Calcium: 8.5 mg/dL — ABNORMAL LOW (ref 8.9–10.3)
Chloride: 104 mmol/L (ref 98–111)
Creatinine, Ser: 3.19 mg/dL — ABNORMAL HIGH (ref 0.61–1.24)
GFR, Estimated: 20 mL/min — ABNORMAL LOW (ref >60)
Glucose, Bld: 131 mg/dL — ABNORMAL HIGH (ref 70–99)
Phosphorus: 2.4 mg/dL — ABNORMAL LOW (ref 2.5–4.6)
Potassium: 3.9 mmol/L (ref 3.5–5.1)
Sodium: 138 mmol/L (ref 135–145)

## 2021-04-30 LAB — BASIC METABOLIC PANEL
Anion gap: 7 (ref 5–15)
Anion gap: 6 (ref 5–15)
Anion gap: 6 (ref 5–15)
Anion gap: 6 (ref 5–15)
BUN: 48 mg/dL — ABNORMAL HIGH (ref 8–23)
BUN: 34 mg/dL — ABNORMAL HIGH (ref 8–23)
BUN: 25 mg/dL — ABNORMAL HIGH (ref 8–23)
BUN: 20 mg/dL (ref 8–23)
CO2: 29 mmol/L (ref 22–32)
CO2: 27 mmol/L (ref 22–32)
CO2: 27 mmol/L (ref 22–32)
CO2: 26 mmol/L (ref 22–32)
Calcium: 8.8 mg/dL — ABNORMAL LOW (ref 8.9–10.3)
Calcium: 8.7 mg/dL — ABNORMAL LOW (ref 8.9–10.3)
Calcium: 8.5 mg/dL — ABNORMAL LOW (ref 8.9–10.3)
Calcium: 8.4 mg/dL — ABNORMAL LOW (ref 8.9–10.3)
Chloride: 103 mmol/L (ref 98–111)
Chloride: 105 mmol/L (ref 98–111)
Chloride: 104 mmol/L (ref 98–111)
Chloride: 106 mmol/L (ref 98–111)
Creatinine, Ser: 5.91 mg/dL — ABNORMAL HIGH (ref 0.61–1.24)
Creatinine, Ser: 4.36 mg/dL — ABNORMAL HIGH (ref 0.61–1.24)
Creatinine, Ser: 3.39 mg/dL — ABNORMAL HIGH (ref 0.61–1.24)
Creatinine, Ser: 2.74 mg/dL — ABNORMAL HIGH (ref 0.61–1.24)
GFR, Estimated: 9 mL/min — ABNORMAL LOW (ref >60)
GFR, Estimated: 13 mL/min — ABNORMAL LOW (ref >60)
GFR, Estimated: 18 mL/min — ABNORMAL LOW (ref >60)
GFR, Estimated: 23 mL/min — ABNORMAL LOW (ref >60)
Glucose, Bld: 173 mg/dL — ABNORMAL HIGH (ref 70–99)
Glucose, Bld: 136 mg/dL — ABNORMAL HIGH (ref 70–99)
Glucose, Bld: 125 mg/dL — ABNORMAL HIGH (ref 70–99)
Glucose, Bld: 134 mg/dL — ABNORMAL HIGH (ref 70–99)
Potassium: 3.9 mmol/L (ref 3.5–5.1)
Potassium: 3.7 mmol/L (ref 3.5–5.1)
Potassium: 3.7 mmol/L (ref 3.5–5.1)
Potassium: 3.8 mmol/L (ref 3.5–5.1)
Sodium: 139 mmol/L (ref 135–145)
Sodium: 138 mmol/L (ref 135–145)
Sodium: 137 mmol/L (ref 135–145)
Sodium: 138 mmol/L (ref 135–145)

## 2021-04-30 LAB — BODY FLUID CELL COUNT WITH DIFFERENTIAL
Eos, Fluid: 0 %
Lymphs, Fluid: 53 %
Monocyte-Macrophage-Serous Fluid: 17 % — ABNORMAL LOW (ref 50–90)
Neutrophil Count, Fluid: 30 % — ABNORMAL HIGH (ref 0–25)
Total Nucleated Cell Count, Fluid: 298 cu mm (ref 0–1000)

## 2021-04-30 LAB — PHOSPHORUS: Phosphorus: 3.4 mg/dL (ref 2.5–4.6)

## 2021-04-30 LAB — GLUCOSE, CAPILLARY
Glucose-Capillary: 153 mg/dL — ABNORMAL HIGH (ref 70–99)
Glucose-Capillary: 122 mg/dL — ABNORMAL HIGH (ref 70–99)
Glucose-Capillary: 158 mg/dL — ABNORMAL HIGH (ref 70–99)
Glucose-Capillary: 128 mg/dL — ABNORMAL HIGH (ref 70–99)
Glucose-Capillary: 114 mg/dL — ABNORMAL HIGH (ref 70–99)
Glucose-Capillary: 130 mg/dL — ABNORMAL HIGH (ref 70–99)

## 2021-04-30 LAB — ALBUMIN: Albumin: 2.1 g/dL — ABNORMAL LOW (ref 3.5–5.0)

## 2021-04-30 LAB — IRON AND TIBC
Iron: 45 ug/dL (ref 45–182)
Saturation Ratios: 37 % (ref 17.9–39.5)
TIBC: 122 ug/dL — ABNORMAL LOW (ref 250–450)
UIBC: 77 ug/dL

## 2021-04-30 LAB — MAGNESIUM: Magnesium: 2.9 mg/dL — ABNORMAL HIGH (ref 1.7–2.4)

## 2021-04-30 LAB — CBC
HCT: 36.6 % — ABNORMAL LOW (ref 39.0–52.0)
Hemoglobin: 12.5 g/dL — ABNORMAL LOW (ref 13.0–17.0)
MCH: 27.1 pg (ref 26.0–34.0)
MCHC: 34.2 g/dL (ref 30.0–36.0)
MCV: 79.2 fL — ABNORMAL LOW (ref 80.0–100.0)
Platelets: 335 10*3/uL (ref 150–400)
RBC: 4.62 MIL/uL (ref 4.22–5.81)
RDW: 15.6 % — ABNORMAL HIGH (ref 11.5–15.5)
WBC: 10.1 10*3/uL (ref 4.0–10.5)
nRBC: 0 % (ref 0.0–0.2)

## 2021-04-30 LAB — LACTATE DEHYDROGENASE, PLEURAL OR PERITONEAL FLUID: LD, Fluid: 50 U/L — ABNORMAL HIGH (ref 3–23)

## 2021-04-30 LAB — PROTEIN, PLEURAL OR PERITONEAL FLUID: Total protein, fluid: <3 g/dL

## 2021-04-30 LAB — GLUCOSE, PLEURAL OR PERITONEAL FLUID: Glucose, Fluid: 151 mg/dL

## 2021-04-30 LAB — FERRITIN: Ferritin: 471 ng/mL — ABNORMAL HIGH (ref 24–336)

## 2021-04-30 MED ORDER — PRISMASOL BGK 4/2.5 32-4-2.5 MEQ/L REPLACEMENT SOLN
Status: DC
  Filled 2021-04-30 (×8): qty 5000

## 2021-04-30 MED ORDER — PRISMASOL BGK 4/2.5 32-4-2.5 MEQ/L EC SOLN
Status: DC
  Filled 2021-04-30 (×14): qty 5000

## 2021-04-30 MED ORDER — SODIUM CHLORIDE 0.9 % IV SOLN
2.0000 g | INTRAVENOUS | Status: DC
  Administered 2021-05-01 – 2021-05-02 (×2): 2 g via INTRAVENOUS
  Filled 2021-04-30 (×2): qty 20

## 2021-04-30 MED ORDER — B COMPLEX-C PO TABS
1.0000 | ORAL_TABLET | Freq: Every day | ORAL | Status: DC
  Administered 2021-04-30 – 2021-05-04 (×4): 1 via ORAL
  Filled 2021-04-30 (×4): qty 1

## 2021-04-30 MED ORDER — SODIUM CHLORIDE 0.9 % IV SOLN
500.0000 [IU]/h | INTRAVENOUS | Status: DC
  Administered 2021-04-30: 500 [IU]/h via INTRAVENOUS_CENTRAL
  Administered 2021-05-01: 750 [IU]/h via INTRAVENOUS_CENTRAL
  Filled 2021-04-30 (×2): qty 2

## 2021-04-30 MED ORDER — FAMOTIDINE 20 MG PO TABS
20.0000 mg | ORAL_TABLET | Freq: Every day | ORAL | Status: DC
  Administered 2021-04-30 – 2021-05-07 (×7): 20 mg via ORAL
  Filled 2021-04-30 (×7): qty 1

## 2021-04-30 MED ORDER — BOOST PLUS PO LIQD
237.0000 mL | Freq: Three times a day (TID) | ORAL | Status: DC
  Administered 2021-04-30 – 2021-05-07 (×14): 237 mL via ORAL
  Filled 2021-04-30 (×25): qty 237

## 2021-04-30 MED ORDER — PRISMASOL BGK 4/2.5 32-4-2.5 MEQ/L REPLACEMENT SOLN
Status: DC
  Filled 2021-04-30 (×4): qty 5000

## 2021-04-30 MED ORDER — GABAPENTIN 100 MG PO CAPS
100.0000 mg | ORAL_CAPSULE | Freq: Two times a day (BID) | ORAL | Status: DC
  Administered 2021-04-30 – 2021-05-07 (×14): 100 mg via ORAL
  Filled 2021-04-30 (×14): qty 1

## 2021-04-30 NOTE — Consult Note (Signed)
? ?                                                                                ?Consultation Note ?Date: 04/30/2021  ? ?Patient Name: [name] [name]  ?DOB: [birth year]  MRN: [id number]  Age / Sex: 76 y.o., male  ?PCP: Patient, No Pcp Per (Inactive) ?Referring Physician: Laurin Coder, MD ? ?Reason for Consultation: Establishing goals of care ? ?HPI/Patient Profile: 76 y.o. male  with past medical history of HTN, HFrEF (echo 04/29/2021 with an EF of 20 to 25% and severely decreased function of the left ventricle), A-fib, CVA with left-sided hemiplegia and aphasia, PVD, and type 2 diabetes admitted on 04/29/2021 with altered mental status x2 days from skilled nursing facility. ? ?On admission patient was hyper kalemia with an elevated creatinine of 11.42 as well as transaminitis.  Nephrology was consulted in the setting of severe hyperkalemia and ARF. CRRT was initiated today, 4/21.  ? ?Clinical Assessment and Goals of Care: ?I have reviewed medical records including EPIC notes, labs and imaging, assessed the patient and then met with patient at bedside  to discuss diagnosis prognosis, GOC, EOL wishes, disposition and options. ? ?I introduced Palliative Medicine as specialized medical care for people living with serious illness. It focuses on providing relief from the symptoms and stress of a serious illness. The goal is to improve quality of life for both the patient and the family. ? ?We discussed a brief life review of the patient.  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  He has lived in [place] while working at [employer].  He spent time in [facility] and says that home and where he is from [place].  We reviewed the patient has written for bucks on black history.  He has 3 children, 1 who resides in [place], one who resides here in [place], and 1 that lives in [place]. ? ?As far as functional and nutritional status patient shares he has not been able to  do much for himself since his stroke in 2010.  He requires heavy assistance with ADLs. ? ?We discussed patient's current illness and what it means in the larger context of patient's on-going co-morbidities. I attempted to elicit values and goals of care important to the patient.  He says that writing and getting back to [place] are important to him.  He says he hopes to be able to move back there and continue writing 1 day. ? ?Advance directives, decision makers in the absence of patient's ability to make his own decisions, and concepts specific to code status, were considered and discussed.  Patient agreed that he wants any and all available and appropriate medical interventions to sustain his life.  He says that if he needed a ventilator he would be fine with that.   ? ?He also says that he is okay with dialysis for now but would never want to have hemodialysis long-term.  He shares that most people he knows that if [facility] dialysis have died. ? ?Education offered regarding concept specific to human mortality and the limitations of medical interventions to prolong life when the body begins to fail to thrive. ?  ?  Discussed with patient the importance of continued conversation with family and the medical providers regarding overall plan of care and treatment options, ensuring decisions are within the context of the patient?s values and GOCs.  Several times during our discussion patient attempted to change the subject and not speak about his medical situation.  I shared that these are ongoing conversations that may be uncomfortable to think and talk about but are important to discuss and outlined. ? ?When asked about who would make decisions for him if he is unable to speak for himself, patient stated that he would not want his son [name] [name] to do it.  He shares that [name] [name] would "make him die too soon".  He shares that his daughter [name] [name] would be the one he would deem as his decision maker if he is unable to speak for  himself.  However, he was clear that he is able to make his decisions right now and is not deferring decision-making to anyone else at this time. ? ?Current plan of care is full code and full scope.  Patient is in agreement with CRRT but future discussions regarding long-term HD needed if long term HD is an option.  ? ?After reviewing the chart and speaking with the patient, patient does not recall completing HCPOA and no HCPOA paperwork is on file.   ? ?In absence of this paperwork, patient's legal decision maker would be his current (?) wife [name] [name].  Patient shares he hopes he is divorced from her but cannot say whether or not he has actually finalized a divorce.   ? ?However, today he stated he would want his daughter [name] [name] to be his surrogate Media planner.  ? ?If patient is divorced and no paperwork is in place for HCPOA, according to [place] law decisions would fall to patient's 3 children - with majority of children needing to come to consensus for any decisions regarding the patient's health. ? ?Patient was appreciative of my visit but did not want to speak any further regarding advance care planning or his medical status.  I shared that the palliative medicine team will continue to follow the patient throughout his hospitalization.  My colleagues will chart check and shadow his chart over the weekend and I will be returning on service and plan to round on him on Monday. ? ?Please utilize Amion to contact providers for any palliative medicine needs over the weekend. ? ?Primary Decision Maker ?PATIENT ? ?Code Status/Advance Care Planning: ?Full code ? ?Prognosis:   ?Unable to determine ? ?Discharge Planning: To Be Determined ? ?Primary Diagnoses: ?Present on Admission: ? Acute renal failure (ARF) ([place]) ? ? ?Physical Exam ?Vitals reviewed.  ?HENT:  ?   Head: Normocephalic and atraumatic.  ?   Mouth/Throat:  ?   Mouth: Mucous membranes are moist.  ?   Comments: Right HD cath in place ?Eyes:  ?   Comments: Right  eyelid droop  ?Cardiovascular:  ?   Rate and Rhythm: Normal rate.  ?   Pulses: Normal pulses.  ?Pulmonary:  ?   Effort: Pulmonary effort is normal.  ?Abdominal:  ?   Palpations: Abdomen is soft.  ?Musculoskeletal:  ?   Comments: Generalized weakness with left sided hemiplegia  ?Skin: ?   General: Skin is warm.  ?Neurological:  ?   Mental Status: He is alert and oriented to person, place, and time.  ?Psychiatric:     ?   Mood and Affect: Mood normal.     ?  Behavior: Behavior normal.     ?   Thought Content: Thought content normal.     ?   Judgment: Judgment normal.  ? ? ?Palliative Assessment/Data: 30% ? ? ? ? ?I discussed this patient's plan of care with patient, nursing. ? ?Thank you for this consult. Palliative medicine will continue to follow and assist holistically.  ? ?Time Total: 75 minutes ?Greater than 50%  of this time was spent counseling and coordinating care related to the above assessment and plan. ? ?Signed by: ?Jordan Hawks, DNP, FNP-BC ?Palliative Medicine ? ?  ?Please contact Palliative Medicine Team phone at [phone] for questions and concerns.  ?For individual provider: See Amion ? ? ? ? ? ? ? ? ? ? ? ? ?  ?

## 2021-04-30 NOTE — Progress Notes (Signed)
Initial Nutrition Assessment ? ?DOCUMENTATION CODES:  ? ?Severe malnutrition in context of chronic illness ? ?INTERVENTION:  ? ?Boost Plus PO TID, each supplement provides 360 kcal and 14 gm protein. ? ?B-complex with vitamin C daily while on CRRT. ? ?NUTRITION DIAGNOSIS:  ? ?Severe Malnutrition related to chronic illness (CHF) as evidenced by severe muscle depletion, severe fat depletion. ? ?GOAL:  ? ?Patient will meet greater than or equal to 90% of their needs ? ?MONITOR:  ? ?PO intake, Supplement acceptance, Labs, Skin ? ?REASON FOR ASSESSMENT:  ? ?Rounds (CRRT, wounds) ?  ? ?ASSESSMENT:  ? ?76 yo male admitted with severe hyperkalemia, acute renal failure. PMH includes HTN, CHF, A fib, CVA with L sided hemiplegia and aphasia, PVD, DM-2. ? ?Discussed patient in ICU rounds and with RN today. ?Patient has multiple wounds and is currently requiring CRRT.  ? ?Patient reports that he usually eats well at SNF PTA. He eats eggs, sausage, and pancakes for breakfast most mornings and has whatever they provide for lunch and dinner. Since admission he has been eating okay, but appetite is not great. He didn't eat dinner yesterday, but ate 75% of breakfast today. He usually does not receive PO supplements, but likes chocolate Boost; RD to order TID between meals.  ? ?Labs reviewed.  ?CBG: [phone] ? ?Medications reviewed and include Levophed. ? ?No recent weights available for review.  ?Patient reports 115 lb weight loss since 2010.  ?Admission weight 84.4 kg ?Current weight 80.7 kg ?I/O -1.2 L since admission ? ?NUTRITION - FOCUSED PHYSICAL EXAM: ? ?Flowsheet Row Most Recent Value  ?Orbital Region Severe depletion  ?Upper Arm Region Mild depletion  ?Thoracic and Lumbar Region Severe depletion  ?Buccal Region Severe depletion  ?Temple Region Severe depletion  ?Clavicle Bone Region Severe depletion  ?Clavicle and Acromion Bone Region Severe depletion  ?Scapular Bone Region Moderate depletion  ?Dorsal Hand Moderate  depletion  ?Patellar Region Moderate depletion  ?Anterior Thigh Region Moderate depletion  ?Posterior Calf Region Moderate depletion  ?Edema (RD Assessment) Moderate  ?Hair Reviewed  ?Eyes Reviewed  ?Mouth Reviewed  ?Skin Reviewed  ?Nails Reviewed  ? ?  ? ? ?Diet Order:   ?Diet Order   ? ?       ?  DIET DYS 3 Room service appropriate? Yes; Fluid consistency: Thin  Diet effective now       ?  ? ?  ?  ? ?  ? ? ?EDUCATION NEEDS:  ? ?No education needs have been identified at this time ? ?Skin:  Skin Assessment: Skin Integrity Issues: ?Skin Integrity Issues:: DTI, Other (Comment) ?DTI: sacrum, back, R & L buttocks ?Other: multiple skin tears ? ?Last BM:  4/20 type 6 ? ?Height:  ? ?Ht Readings from Last 1 Encounters:  ?04/29/21 '5\' 11"'$  (1.803 m)  ? ? ?Weight:  ? ?Wt Readings from Last 1 Encounters:  ?04/30/21 80.7 kg  ? ? ? ?BMI:  Body mass index is 24.81 kg/m?. ? ?Estimated Nutritional Needs:  ? ?Kcal:  2300-2500 ? ?Protein:  130-160 gm ? ?Fluid:  2.3-2.5 L ? ? ?Lucas Mallow RD, LDN, CNSC ?Please refer to Amion for contact information.                                                       ? ?

## 2021-04-30 NOTE — Procedures (Signed)
Thoracentesis  Procedure Note ? ?[name] [name]  ?[id number]  ?[birth year] ? ?Date:04/30/21  ?Time:2:59 PM  ? ?Provider Performing:Kielan Dreisbach A Shamiyah Ngu  ? ?Procedure: Thoracentesis with imaging guidance ([id number]) ? ?Indication(s) ?Pleural Effusion ? ?Consent ?Risks of the procedure as well as the alternatives and risks of each were explained to the patient and/or caregiver.  Consent for the procedure was obtained and is signed in the bedside chart ? ?Anesthesia ?Topical only with 1% lidocaine  ? ? ?Time Out ?Verified patient identification, verified procedure, site/side was marked, verified correct patient position, special equipment/implants available, medications/allergies/relevant history reviewed, required imaging and test results available. ? ? ?Sterile Technique ?Maximal sterile technique including full sterile barrier drape, hand hygiene, sterile gown, sterile gloves, mask, hair covering, sterile ultrasound probe cover (if used). ? ?Procedure Description ?Ultrasound was used to identify appropriate pleural anatomy for placement and overlying skin marked.  Area of drainage cleaned and draped in sterile fashion. Lidocaine was used to anesthetize the skin and subcutaneous tissue.  800 cc's of amber appearing fluid was drained from the right pleural space. Catheter then removed and bandaid applied to site. ? ? ?Complications/Tolerance ?None; patient tolerated the procedure well. ?Chest X-ray is ordered to confirm no post-procedural complication. ? ? ?EBL ?Minimal ? ? ?Specimen(s) ?Pleural fluid ? ? ? ? ? ? ? ? ? ? ? ?

## 2021-04-30 NOTE — Progress Notes (Signed)
?[facility] ?Progress Note  ? ? 76 y.o. male with h/o CVA, DM, HFrEF, pAFib who presented with AMS found to have AKI and hyperkalemia with noted distended bladder -> foley with good UOP.   ?Of note pt DNR/DNI and has routine visits with Palliative care for symptom management but doesn't appear to actively be on hospice.  ? ?Assessment/ Plan:   ?AKI, severe:  suspect this is obstructive in etiology given report of > 2L of urine outpt when foley inserted.  Follow serial labs.  Not a candidate for long term dialysis in light of comorbids and functional status.  Hold entresto. Strict I/Os. ?- Had to start CRRT 4/20 because of refractory hyperkalemia after d/w pt's son. ?- Seen on CRRT pre/post/qd 500/400/2000 on 2K bath ?Change to 4K bath and will monitor  ? ?UOP really decreased after initial great UOP. ?Check bladder scan and if distended then flush foley. ? ?Hyperkalemia, severe: secondary to #1 and should improve now that BOO relieved.  Rec'd na bicarb, IV ca, insulin, glucose.  Initially gave 1L NS boluses and lasix 100 IV to promote K excretion. Further fluids/diuretics as clinically indicated; caution with h/o severe CHF.  ?See above ?AMS: suspect multifactorial with h/o CVA, severe AKI, gabapentin - hold.  further w/u per primary ?HFrEF: 2019 TTE with EF 20-25% with grade 2 DD.  Caution with IV fluids.  Hold entresto w AKI. ?H/o CVA, L hemiparesis, report of h/o aphasia: hold gabapentin with AKI ?A fib: on amiodarone ? ?Subjective:   ?Hypotensive with hyperkalemia yesterday + pressor -> CRRT  ? ?Objective:   ?BP (!) 112/57   Pulse 68   Temp (!) 96.3 ?F (35.7 ?C) (Bladder)   Resp 18   Ht '5\' 11"'$  (1.803 m)   Wt 80.7 kg   SpO2 92%   BMI 24.81 kg/m?  ? ?Intake/Output Summary (Last 24 hours) at 04/30/2021 1058 ?Last data filed at 04/30/2021 1000 ?Gross per 24 hour  ?Intake 3836.95 ml  ?Output 4586 ml  ?Net -749.05 ml  ? ?Weight change: -3.669 kg ? ?Physical Exam: ?Gen: lying at 30 degrees in bed,  arousable and pleasant ?Eyes: anicteric ?Neck: flat JVP ?CV: RRR, no rub, narrow complex QRS on monitor ?Abd: soft, nontender, nondistended ?Lungs: normal WOB, clear ?GU: foley draining amber urine ?Extr:  L arm and leg hemiparesis with mild dependent edema, no R edema ?Neuro:  dysarthric but able to answer some simple questions ?Skin: cool and dry ? ?Imaging: ?CT ABDOMEN PELVIS WO CONTRAST ? ?Result Date: 04/29/2021 ?CLINICAL DATA:  Sepsis.  Acute renal failure. EXAM: CT ABDOMEN AND PELVIS WITHOUT CONTRAST TECHNIQUE: Multidetector CT imaging of the abdomen and pelvis was performed following the standard protocol without IV contrast. RADIATION DOSE REDUCTION: This exam was performed according to the departmental dose-optimization program which includes automated exposure control, adjustment of the mA and/or kV according to patient size and/or use of iterative reconstruction technique. COMPARISON:  CT abdomen 05/08/2020. CT abdomen pelvis 04/26/2020. FINDINGS: Lower chest: Moderate size right and small left pleural effusions are present. There is airspace consolidation in the left lower lobe and atelectasis in the right lower lobe. Heart is enlarged, unchanged. Hepatobiliary: The liver is mildly diffusely hyperdense without focal lesion identified. Gallstones and sludge are present. No biliary ductal dilatation. Pancreas: Unremarkable. No pancreatic ductal dilatation or surrounding inflammatory changes. Spleen: Normal in size without focal abnormality. Adrenals/Urinary Tract: There is mild bilateral hydroureteronephrosis to the level of the bladder without obstructing calculus. The bladder is markedly  distended. There is mild diffuse bladder wall thickening and surrounding inflammation despite Foley catheter in the bladder. Multiple layering bladder calculi are visualized measuring up to 5 mm. Adrenal glands are within normal limits. There are numerous bilateral renal cysts measuring up to 4.3 cm. There are additional  hyperdense lesions in both kidneys with the largest in the left kidney measuring 3 cm. These are similar to the prior study. There is a single punctate 3 mm calculus in the left kidney, unchanged. Stomach/Bowel: Stomach is within normal limits. Appendix appears normal. No evidence of bowel wall thickening, distention, or inflammatory changes. There is sigmoid colon diverticulosis without evidence for acute diverticulitis. Vascular/Lymphatic: Aorta is normal in size. Infrarenal IVC filter is present. There are atherosclerotic calcifications of the aorta. Vascular stents are seen in the iliac arteries. There is a new enlarged paraesophageal lymph node measuring 13 mm short axis image 3/15. Reproductive: Prostate gland is enlarged, unchanged. Other: There is diffuse body wall edema, new from prior. There is no ascites or free air. There is thinning of the anterior abdominal wall, unchanged. Musculoskeletal: The bones are osteopenic. Multilevel degenerative changes affect the spine. Vertebral body hemangioma noted at L4. Left-sided hip screw is present. There is heterotopic ossification surrounding the left hip. Erosive changes of the distal sacrum and coccyx are unchanged. Rounded low-density collection in the soft tissues in this region measuring 12 mm appears unchanged, left of midline. IMPRESSION: 1. Diffuse bladder wall thickening with mild surrounding inflammation concerning for cystitis. 2. The bladder is markedly distended despite Foley catheter placement. Please correlate clinically. 3. Mild bilateral hydroureteronephrosis without obstructing calculus may be secondary to distended bladder. Follow-up recommended. 4. Stable nonobstructing left renal calculus. 5. The liver is diffusely hyperdense, a new finding. Findings may related to iron deposition, copper deposition, or amiodarone hepatotoxicity. Recommend clinical correlation and follow-up. 6. Stable erosive changes of the sacrum and coccyx with stable  chronic small soft fluid collection. 7. Moderate right and small left pleural effusions with left lower lobe consolidation. 8. New enlarged paraesophageal lymph node. 9. New diffuse body wall edema. 10. Stable bilateral renal lesions. 11. Aortic Atherosclerosis (ICD10-I70.0). Electronically Signed   By: Ronney Asters M.D.   On: 04/29/2021 19:08  ? ?US RENAL ? ?Result Date: 04/29/2021 ?CLINICAL DATA:  Acute kidney injury EXAM: RENAL / URINARY TRACT ULTRASOUND COMPLETE COMPARISON:  Abdominal CT 05/08/2020 FINDINGS: Right Kidney: Renal measurements: 10 x 5 x 4.5 cm = volume: 115 mL. Diffusely increased echogenicity. Multiple cysts, all simple appearing and measuring up to 3.5 cm at the lower pole. No hydronephrosis. Left Kidney: Not visualized, likely due to shadowing bowel gas. Bladder: Foley catheter is present with tip in the lumen. Moderate distension of the trabeculated bladder. IMPRESSION: 1. Nonvisualized left kidney, likely from shadowing bowel gas. 2. No acute finding on the right where there is medical renal disease and cysts. 3. Located Foley catheter with moderate distension of the bladder. Electronically Signed   By: Jorje Guild M.D.   On: 04/29/2021 07:01  ? ?DG CHEST PORT 1 VIEW ? ?Result Date: 04/29/2021 ?CLINICAL DATA:  Central line placement. EXAM: PORTABLE CHEST 1 VIEW COMPARISON:  Chest x-ray dated April 26, 2020. FINDINGS: Right internal jugular central venous catheter with tip in the proximal right atrium. Cardiomegaly. Diffuse interstitial thickening. Large left and moderate right pleural effusions. Collapse of the lingula and left lower lobe. Patchy airspace disease in the remaining aerated left upper lobe. Partial collapse of the right lower lobe. No pneumothorax. No  acute osseous abnormality. IMPRESSION: 1. Right internal jugular central venous catheter with tip in the proximal right atrium. No pneumothorax. 2. Pulmonary edema with large left and moderate right pleural effusions.  Electronically Signed   By: Titus Dubin M.D.   On: 04/29/2021 18:39  ? ?ECHOCARDIOGRAM COMPLETE ? ?Result Date: 04/29/2021 ?   ECHOCARDIOGRAM REPORT   Patient Name:   [name] [name] Date of Exam: 04/29/2021 Medical Rec #

## 2021-04-30 NOTE — Progress Notes (Signed)
? ?NAME:  [name] [name], MRN:  [id number], DOB:  1945/10/20, LOS: 1 ?ADMISSION DATE:  04/29/2021 CONSULTATION DATE:  04/29/2021 ?REFERRING MD:  Sedonia Small - EDP CHIEF COMPLAINT:  Hyperkalemia, ARF  ? ?History of Present Illness:  ?76 year old man who presented to [facility] ED 4/20 with AMS x 2 days. Per SNF where patient resides, usually A&Ox4. Sent for outpatient labs which were abnormal. 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 ? ?On ED arrival, patient was drowsy, but appropriately answering questions. Denies fever, reports chills. Denies CP/SOB, n/v/d/ or changes in bowel habits. Denies decreased UOP or LUTS, denies abdominal pain. Endorses some confusion, dizziness and weakness. Labs notable for Na 134, K > 7.5, Cr 11.42 (baseline 1.3-1.5), transaminitis (AST 128, ALT 164). Lipase normal, CK 39. CBC grossly unremarkable. EKG with concern for hyperK-related changes and calcium/bicarb initiated. UA grossly negative; however, on physical exam patient's lower abdomen was significantly distended, raising concern for bladder outlet obstruction. Foley was placed with 2L dark UOP. Nephrology was consulted in the setting of severe hyperkalemia/ARF. ? ?PCCM consulted for ICU admission in the setting of severe ARF. ? ?Pertinent Medical History:  ? ?Past Medical History:  ?Diagnosis Date  ? Chronic systolic CHF (congestive heart failure) ([place])   ? CVA, old, aphasia   ? Diabetes ([place])   ? Hemiplegia ([place])   ? HTN (hypertension)   ? Intestinal obstruction ([place]) 11/2015  ? Peripheral vascular disease ([place])   ? Pneumonia   ? Stroke [facility])   ? ?Significant Hospital Events: ?Including procedures, antibiotic start and stop dates in addition to other pertinent events   ?4/20 - Presented from SNF with AMS x 1-2 days. Found to be hyperkalemic to > 7.5 with ARF (Cr 11, baseline 1.3-1.5). EKG changes and  bicarb/Ca given. C/f bladder outlet obstruction, Foley placed with 2L output. Nephro consulted. PCCM to admit. ?4/21-arterial line placed, central venous catheter placed ?4/21-started on CRRT ? ?Interim History / Subjective:  ?No overnight events ?Tolerating CRRT well ?Awake alert interactive, no complaints ? ?Objective:  ?Blood pressure (!) 101/52, pulse 68, temperature (!) 96.3 ?F (35.7 ?C), temperature source Bladder, resp. rate 11, height '5\' 11"'$  (1.803 m), weight 80.7 kg, SpO2 95 %. ?   ?FiO2 (%):  [32 %] 32 %  ? ?Intake/Output Summary (Last 24 hours) at 04/30/2021 0920 ?Last data filed at 04/30/2021 0907 ?Gross per 24 hour  ?Intake 3577.08 ml  ?Output 4302 ml  ?Net -724.92 ml  ? ?Filed Weights  ? 04/29/21 0127 04/30/21 0445  ?Weight: 84.4 kg 80.7 kg  ? ?Physical Examination: ?General: Chronically ill-appearing, ?HEENT: Dry mucous membranes ?Neuro: Aphasic, generalized weakness  ?CV: S1-S2 appreciated ?PULM: Few rales at the bases, decreased air entry at the bases  ?GI: Soft, bowel sounds appreciated ?Extremities: Left lower extremity edema, trace right lower extremity edema ?Skin: Warm/dry, bilateral feet with significant callous/skin darkening 2/2 PVD. ? ?Resolved Hospital Problem List:  ? ? ?Assessment & Plan:  ? ?Acute kidney injury secondary to bladder outlet obstruction ?Severe hyperkalemia ?-Tolerating CRRT well ?-Continue to trend electrolytes ?-Electrolytes better ?-Bloody urine ?-Avoid nephrotoxic agents ?-Renal service continues to follow, will defer for decision regarding duration of CRRT ? ?Altered mental status ?-Multifactorial ?-Initiate gabapentin at a lower dose ? ?Transaminitis ?-Amiodarone on hold ?-Will trend LFTs ?-Obtain iron studies ?-Abdominal CT noted ? ?Heart failure with reduced ejection fraction ?History of atrial fibrillation ?-Most recent echo  with ejection fraction of 20 to 41%, diastolic dysfunction ?-Hold amiodarone for transaminitis ?-Follow-up on echocardiogram ? ?Prior CVA with  left residual deficits ?Dysphagia ?Dysphagia ?-Previously had a PEG ?-Speech for swallowing evaluation ?- Nutrition consult to assess needs ? ?Eva ?-Patient is a full code at present ?-Discussed with son [name] [name] and daughter [name] [name] ? ?Large right pleural effusion ?-Will benefit from thoracentesis ? ?Concern for possible pneumonia ?Possible aspiration pneumonia ?-No fever ?-No leukocytosis ?-Will de-escalate antibiotics to Rocephin ? ?Best Practice: (right click and "Reselect all SmartList Selections" daily)  ? ?Diet/type: NPO w/ oral meds ?DVT prophylaxis: prophylactic heparin  ?GI prophylaxis: PPI ?Lines: N/A ?Foley:  Yes, and it is still needed ?Code Status: Full code ?Last date of multidisciplinary goals of care discussion [full code at present] ? ?Labs:  ?CBC: ?Recent Labs  ?Lab 04/29/21 ?0150 04/29/21 ?1641 04/29/21 ?1654 04/30/21 ?0200  ?WBC 5.4 6.2  --  10.1  ?HGB 14.5 12.1*  --  12.5*  ?HCT 44.3 36.2*  --  36.6*  ?MCV 80.0 80.8  --  79.2*  ?PLT 441* 326 315 335  ? ? ?Basic Metabolic Panel: ?Recent Labs  ?Lab 04/29/21 ?6606 04/29/21 ?1153 04/29/21 ?2005 04/30/21 ?0200 04/30/21 ?0757  ?NA 134* 140 138 139 138  ?K 7.0* 7.2* 4.7 3.9 3.7  ?CL 102 104 103 103 105  ?CO2 '24 25 27 29 27  '$ ?GLUCOSE 149* 119*  121* 195* 173* 136*  ?BUN 88* 87* 72* 48* 34*  ?CREATININE 10.40* 10.64* 8.57* 5.91* 4.36*  ?CALCIUM 9.8 10.3 9.1 8.8* 8.7*  ?MG  --   --   --  2.9*  --   ?PHOS  --   --   --  3.4  --   ? ?GFR: ?Estimated Creatinine Clearance: 15.6 mL/min (A) (by C-G formula based on SCr of 4.36 mg/dL (H)). ?Recent Labs  ?Lab 04/29/21 ?0150 04/29/21 ?0522 04/29/21 ?1153 04/29/21 ?1358 04/29/21 ?1641 04/29/21 ?1654 04/29/21 ?1812 04/30/21 ?0200  ?WBC 5.4  --   --   --  6.2  --   --  10.1  ?LATICACIDVEN  --    < > 2.2* 2.5*  --  2.5* 1.7  --   ? < > = values in this interval not displayed.  ? ?Liver Function Tests: ?Recent Labs  ?Lab 04/29/21 ?0150 04/30/21 ?0200  ?AST 128*  --   ?ALT 164*  --   ?ALKPHOS 87  --    ?BILITOT 0.8  --   ?PROT 6.4*  --   ?ALBUMIN 2.5* 2.1*  ? ?Recent Labs  ?Lab 04/29/21 ?0150  ?LIPASE 29  ? ?No results for input(s): AMMONIA in the last 168 hours. ? ?ABG: ?No results found for: PHART, PCO2ART, PO2ART, HCO3, TCO2, ACIDBASEDEF, O2SAT  ? ?Coagulation Profile: ?Recent Labs  ?Lab 04/29/21 ?1654  ?INR 1.3*  ? ? ?Cardiac Enzymes: ?Recent Labs  ?Lab 04/29/21 ?0150  ?CKTOTAL 39*  ? ?HbA1C: ?No results found for: HGBA1C ? ?CBG: ?Recent Labs  ?Lab 04/29/21 ?1541 04/29/21 ?1948 04/29/21 ?2300 04/30/21 ?3016 04/30/21 ?0735  ?GLUCAP 192* 184* 153* 153* 122*  ? ? ?Review of Systems:   ?Review of systems completed with pertinent positives/negatives outlined in above HPI. ? ?Past Medical History:  ?He,  has a past medical history of Chronic systolic CHF (congestive heart failure) ([place]), CVA, old, aphasia, Diabetes ([place]), Hemiplegia ([place]), HTN (hypertension), Intestinal obstruction ([place]) (11/2015), Peripheral vascular disease ([place]), Pneumonia, and Stroke ([place]).  ? ?Surgical History:  ? ?Past Surgical History:  ?Procedure  Laterality Date  ? ABDOMINAL ADHESION SURGERY  11/2015  ? PERIPHERAL VASCULAR CATHETERIZATION    ? R-EIA stent, possibly R-Subclavian stent 2017  ?  ?Social History:  ? reports that he has quit smoking. He has never used smokeless tobacco. He reports that he does not drink alcohol and does not use drugs.  ? ?Family History:  ?His family history is negative for CAD.  ? ?Allergies: ?No Known Allergies  ? ?Home Medications: ?Prior to Admission medications   ?Medication Sig Start Date End Date Taking? Authorizing Provider  ?acetaminophen (TYLENOL) 325 MG tablet Place 650 mg into feeding tube every 6 (six) hours as needed for mild pain.    [provider]  ?amiodarone (PACERONE) 200 MG tablet Take 200 mg by mouth daily.     [provider]  ?aspirin 81 MG chewable tablet Chew 81 mg by mouth daily.     [provider]  ?atorvastatin (LIPITOR) 40 MG tablet Take 40 mg by mouth at  bedtime.     [provider]  ?Cholecalciferol (VITAMIN D-3) 125 MCG (5000 UT) TABS Take 1 tablet by mouth daily.    [provider]  ?esomeprazole (NEXIUM) 20 MG capsule Take 20 mg by mouth daily

## 2021-04-30 NOTE — Progress Notes (Signed)
Speech Language Pathology Treatment: Dysphagia  ?Patient Details ?Name: [name] [name] ?MRN: [id number] ?DOB: Mar 23, 1945 ?Today's Date: 04/30/2021 ?Time: 2620-3559 ?SLP Time Calculation (min) (ACUTE ONLY): 10 min ? ?Assessment / Plan / Recommendation ?Clinical Impression ? Pt now on CRRT, but fully alert, speech clearer than yesterday; pt is able to communicate wants and needs clearly. RN reports pt has tolerated meals with self feeding though it can be a bit of a mess. There have been maybe 1-2 instances of coughing with thins, but generally pt is improving from a swallowing standpoint. Pt reports good tolerance of meals and that he is happy with food texture. SLP observed pt taking sips of thins with pills, no signs of aspiration. Will f/u next week as pt did have a bit of AMS yesterday. If his mentation were to change, his ability to eat and drink safely would also change.   ?HPI HPI: 76 year old man who presented to [facility] ED 4/20 with AMS x 2 days. Per SNF where patient resides, usually A&Ox4. Sent for outpatient labs which were abnormal. Pt has a history of oral dysphagia at baseline though per notes in 2021 pt is able to compensate for oral dysphagia, uses liquid washes and has not required texture modification in prior admissions since recovery from initial CVA. 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 ?  ?   ?SLP Plan ? Continue with current plan of care ? ?  ?  ?Recommendations for follow up therapy are one component of a multi-disciplinary discharge planning process, led by the attending physician.  Recommendations may be updated based on patient status, additional functional criteria and insurance authorization. ?  ? ?Recommendations  ?Diet recommendations: Dysphagia 3 (mechanical soft);Thin liquid ?Liquids provided via: Straw ?Medication Administration: Whole meds with  liquid ?Supervision: Staff to assist with self feeding ?Compensations: Minimize environmental distractions;Slow rate;Small sips/bites ?Postural Changes and/or Swallow Maneuvers: Seated upright 90 degrees  ?   ?    ?   ? ? ? ? Oral Care Recommendations: Oral care BID ?Plan: Continue with current plan of care ? ? ? ? ?  ?  ? ? ?Lemonte Al, Katherene Ponto ? ?04/30/2021, 11:25 AM ?

## 2021-05-01 DIAGNOSIS — N179 Acute kidney failure, unspecified: Secondary | ICD-10-CM | POA: Diagnosis not present

## 2021-05-01 DIAGNOSIS — E43 Unspecified severe protein-calorie malnutrition: Secondary | ICD-10-CM | POA: Insufficient documentation

## 2021-05-01 LAB — RENAL FUNCTION PANEL
Albumin: 2 g/dL — ABNORMAL LOW (ref 3.5–5.0)
Albumin: 1.9 g/dL — ABNORMAL LOW (ref 3.5–5.0)
Anion gap: 5 (ref 5–15)
Anion gap: 6 (ref 5–15)
BUN: 14 mg/dL (ref 8–23)
BUN: 11 mg/dL (ref 8–23)
CO2: 27 mmol/L (ref 22–32)
CO2: 27 mmol/L (ref 22–32)
Calcium: 8.6 mg/dL — ABNORMAL LOW (ref 8.9–10.3)
Calcium: 9 mg/dL (ref 8.9–10.3)
Chloride: 105 mmol/L (ref 98–111)
Chloride: 104 mmol/L (ref 98–111)
Creatinine, Ser: 2.12 mg/dL — ABNORMAL HIGH (ref 0.61–1.24)
Creatinine, Ser: 1.89 mg/dL — ABNORMAL HIGH (ref 0.61–1.24)
GFR, Estimated: 32 mL/min — ABNORMAL LOW (ref >60)
GFR, Estimated: 37 mL/min — ABNORMAL LOW (ref >60)
Glucose, Bld: 143 mg/dL — ABNORMAL HIGH (ref 70–99)
Glucose, Bld: 103 mg/dL — ABNORMAL HIGH (ref 70–99)
Phosphorus: 1.7 mg/dL — ABNORMAL LOW (ref 2.5–4.6)
Phosphorus: 2.2 mg/dL — ABNORMAL LOW (ref 2.5–4.6)
Potassium: 3.8 mmol/L (ref 3.5–5.1)
Potassium: 3.8 mmol/L (ref 3.5–5.1)
Sodium: 137 mmol/L (ref 135–145)
Sodium: 137 mmol/L (ref 135–145)

## 2021-05-01 LAB — CBC
HCT: 38.2 % — ABNORMAL LOW (ref 39.0–52.0)
Hemoglobin: 12.5 g/dL — ABNORMAL LOW (ref 13.0–17.0)
MCH: 26.4 pg (ref 26.0–34.0)
MCHC: 32.7 g/dL (ref 30.0–36.0)
MCV: 80.8 fL (ref 80.0–100.0)
Platelets: 306 10*3/uL (ref 150–400)
RBC: 4.73 MIL/uL (ref 4.22–5.81)
RDW: 15.9 % — ABNORMAL HIGH (ref 11.5–15.5)
WBC: 11.5 10*3/uL — ABNORMAL HIGH (ref 4.0–10.5)
nRBC: 0 % (ref 0.0–0.2)

## 2021-05-01 LAB — APTT: aPTT: 77 seconds — ABNORMAL HIGH (ref 24–36)

## 2021-05-01 LAB — GLUCOSE, CAPILLARY: Glucose-Capillary: 114 mg/dL — ABNORMAL HIGH (ref 70–99)

## 2021-05-01 LAB — MAGNESIUM: Magnesium: 2.5 mg/dL — ABNORMAL HIGH (ref 1.7–2.4)

## 2021-05-01 MED ORDER — SODIUM PHOSPHATES 45 MMOLE/15ML IV SOLN
10.0000 mmol | Freq: Once | INTRAVENOUS | Status: AC
  Administered 2021-05-01: 10 mmol via INTRAVENOUS
  Filled 2021-05-01: qty 3.33

## 2021-05-01 MED ORDER — ONDANSETRON HCL 4 MG/2ML IJ SOLN
4.0000 mg | Freq: Three times a day (TID) | INTRAMUSCULAR | Status: DC | PRN
  Filled 2021-05-01: qty 2

## 2021-05-01 NOTE — Progress Notes (Signed)
? ?NAME:  [name] [name], MRN:  [id number], DOB:  11-23-1945, LOS: 2 ?ADMISSION DATE:  04/29/2021 CONSULTATION DATE:  04/29/2021 ?REFERRING MD:  Sedonia Small - EDP CHIEF COMPLAINT:  Hyperkalemia, ARF  ? ?History of Present Illness:  ?76 year old man who presented to [facility] ED 4/20 with AMS x 2 days. Per SNF where patient resides, usually A&Ox4. Sent for outpatient labs which were abnormal. 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 ? ?On ED arrival, patient was drowsy, but appropriately answering questions. Denies fever, reports chills. Denies CP/SOB, n/v/d/ or changes in bowel habits. Denies decreased UOP or LUTS, denies abdominal pain. Endorses some confusion, dizziness and weakness. Labs notable for Na 134, K > 7.5, Cr 11.42 (baseline 1.3-1.5), transaminitis (AST 128, ALT 164). Lipase normal, CK 39. CBC grossly unremarkable. EKG with concern for hyperK-related changes and calcium/bicarb initiated. UA grossly negative; however, on physical exam patient's lower abdomen was significantly distended, raising concern for bladder outlet obstruction. Foley was placed with 2L dark UOP. Nephrology was consulted in the setting of severe hyperkalemia/ARF. ? ?PCCM consulted for ICU admission in the setting of severe ARF. ? ?Pertinent Medical History:  ? ?Past Medical History:  ?Diagnosis Date  ? Chronic systolic CHF (congestive heart failure) ([place])   ? CVA, old, aphasia   ? Diabetes ([place])   ? Hemiplegia ([place])   ? HTN (hypertension)   ? Intestinal obstruction ([place]) 11/2015  ? Peripheral vascular disease ([place])   ? Pneumonia   ? Stroke [facility])   ? ?Significant Hospital Events: ?Including procedures, antibiotic start and stop dates in addition to other pertinent events   ?4/20 - Presented from SNF with AMS x 1-2 days. Found to be hyperkalemic to > 7.5 with ARF (Cr 11, baseline 1.3-1.5). EKG changes and  bicarb/Ca given. C/f bladder outlet obstruction, Foley placed with 2L output. Nephro consulted. PCCM to admit. ?4/21-arterial line placed, central venous catheter placed ?4/21-started on CRRT. ?4/21-right-sided thoracentesis for 800 cc of amber-colored fluid-transudative ?4/22 CRRT will be held today ? ?Interim History / Subjective:  ?No overnight events ?Tolerated CRRT well ?CRRT will be held today ?Did have some nausea with vomiting this morning ? ?Objective:  ?Blood pressure (!) 97/55, pulse 72, temperature 97.9 ?F (36.6 ?C), temperature source Bladder, resp. rate 16, height '5\' 11"'$  (1.803 m), weight 79.9 kg, SpO2 93 %. ?   ?   ? ?Intake/Output Summary (Last 24 hours) at 05/01/2021 0940 ?Last data filed at 05/01/2021 0900 ?Gross per 24 hour  ?Intake 1685.96 ml  ?Output 2773 ml  ?Net -1087.04 ml  ? ?Filed Weights  ? 04/29/21 0127 04/30/21 0445 05/01/21 0500  ?Weight: 84.4 kg 80.7 kg 79.9 kg  ? ?Physical Examination: ?General: Chronically ill-appearing ?HEENT: Dry mucous membranes ?Neuro: Aphasic, with generalized weakness  ?CV: S1-S2 appreciated ?PULM: rales at the bases  ?GI: Soft, bowel sounds appreciated ?Extremities: Trace lower extremity edema bilaterally ?Skin: Warm/dry, bilateral feet with significant callous/skin darkening 2/2 PVD. ? ?Resolved Hospital Problem List:  ? ? ?Assessment & Plan:  ? ?Acute kidney injury secondary to bladder outlet obstruction ?Severe hypokalemia ?-Did tolerate CRRT well ?-Appreciate nephrology follow-up-CRRT to be held today ?-Good urine output ?-Avoid nephrotoxic agent ? ?Altered mental status ?-Multifactorial ?-Gabapentin was initiated at a lower dose, may need increase if mental status continues to be stable ? ?Transaminitis ?-Amiodarone on hold for transaminitis ?-We will continue to trend ?-Iron studies revealed normal saturation  ratios, elevated ferritin ?-Hold iron supplementation ? ?Heart failure with reduced ejection fraction ?History of atrial fibrillation ?-Most recent  echo with ejection fraction of 20 to 14%, diastolic dysfunction ?-Amiodarone on hold for transaminitis ? ?Prior CVA with left residual deficits ?Dysphagia ?-Previously had a PEG tube ?-Tolerating orally but did have some nausea this morning ? ?Goals of care ?-Is a full code at present ?-Son Whitaker Holderman and daughter [name] involved in decision making for [name] at present ? ? ?Large right pleural effusion ?-Tolerated thoracentesis well ?-Transudative effusion with significant improvement in chest x-ray findings ? ?Concern for possible pneumonia ?Possible aspiration pneumonia ?-No fever ?-No leukocytosis ?-Continue Rocephin ? ?Best Practice: (right click and "Reselect all SmartList Selections" daily)  ? ?Diet/type: Regular consistency (see orders) ?DVT prophylaxis: prophylactic heparin  ?GI prophylaxis: PPI ?Lines: N/A ?Foley:  Yes, and it is still needed ?Code Status: Full code ?Last date of multidisciplinary goals of care discussion [full code at present] ? ?Labs:  ?CBC: ?Recent Labs  ?Lab 04/29/21 ?0150 04/29/21 ?1641 04/29/21 ?1654 04/30/21 ?0200 05/01/21 ?0210  ?WBC 5.4 6.2  --  10.1 11.5*  ?HGB 14.5 12.1*  --  12.5* 12.5*  ?HCT 44.3 36.2*  --  36.6* 38.2*  ?MCV 80.0 80.8  --  79.2* 80.8  ?PLT 441* 326 315 335 306  ? ? ?Basic Metabolic Panel: ?Recent Labs  ?Lab 04/30/21 ?0200 04/30/21 ?0757 04/30/21 ?1353 04/30/21 ?1608 04/30/21 ?1933 05/01/21 ?0210 05/01/21 ?0214  ?NA 139 138 137 138 138  --  137  ?K 3.9 3.7 3.7 3.9 3.8  --  3.8  ?CL 103 105 104 104 106  --  105  ?CO2 '29 27 27 27 26  '$ --  27  ?GLUCOSE 173* 136* 125* 131* 134*  --  143*  ?BUN 48* 34* 25* 22 20  --  14  ?CREATININE 5.91* 4.36* 3.39* 3.19* 2.74*  --  2.12*  ?CALCIUM 8.8* 8.7* 8.5* 8.5* 8.4*  --  8.6*  ?MG 2.9*  --   --   --   --  2.5*  --   ?PHOS 3.4  --   --  2.4*  --   --  1.7*  ? ?GFR: ?Estimated Creatinine Clearance: 32.1 mL/min (A) (by C-G formula based on SCr of 2.12 mg/dL (H)). ?Recent Labs  ?Lab 04/29/21 ?0150 04/29/21 ?0522  04/29/21 ?1153 04/29/21 ?1358 04/29/21 ?1641 04/29/21 ?1654 04/29/21 ?1812 04/30/21 ?0200 05/01/21 ?0210  ?WBC 5.4  --   --   --  6.2  --   --  10.1 11.5*  ?LATICACIDVEN  --    < > 2.2* 2.5*  --  2.5* 1.7  --   --   ? < > = values in this interval not displayed.  ? ?Liver Function Tests: ?Recent Labs  ?Lab 04/29/21 ?0150 04/30/21 ?0200 04/30/21 ?1608 05/01/21 ?0214  ?AST 128*  --   --   --   ?ALT 164*  --   --   --   ?ALKPHOS 87  --   --   --   ?BILITOT 0.8  --   --   --   ?PROT 6.4*  --   --   --   ?ALBUMIN 2.5* 2.1* 2.1* 2.0*  ? ?Recent Labs  ?Lab 04/29/21 ?0150  ?LIPASE 29  ? ?No results for input(s): AMMONIA in the last 168 hours. ? ?ABG: ?No results found for: PHART, PCO2ART, PO2ART, HCO3, TCO2, ACIDBASEDEF, O2SAT  ? ?Coagulation Profile: ?Recent Labs  ?Lab 04/29/21 ?  1654  ?INR 1.3*  ? ? ?Cardiac Enzymes: ?Recent Labs  ?Lab 04/29/21 ?0150  ?CKTOTAL 39*  ? ?HbA1C: ?No results found for: HGBA1C ? ?CBG: ?Recent Labs  ?Lab 04/30/21 ?1116 04/30/21 ?1523 04/30/21 ?1935 04/30/21 ?2328 05/01/21 ?0336  ?GLUCAP 158* 128* 114* 130* 114*  ? ? ?Review of Systems:   ?Review of systems completed with pertinent positives/negatives outlined in above HPI. ? ?Past Medical History:  ?He,  has a past medical history of Chronic systolic CHF (congestive heart failure) ([place]), CVA, old, aphasia, Diabetes ([place]), Hemiplegia ([place]), HTN (hypertension), Intestinal obstruction ([place]) (11/2015), Peripheral vascular disease ([place]), Pneumonia, and Stroke ([place]).  ? ?Surgical History:  ? ?Past Surgical History:  ?Procedure Laterality Date  ? ABDOMINAL ADHESION SURGERY  11/2015  ? PERIPHERAL VASCULAR CATHETERIZATION    ? R-EIA stent, possibly R-Subclavian stent 2017  ?  ?Social History:  ? reports that he has quit smoking. He has never used smokeless tobacco. He reports that he does not drink alcohol and does not use drugs.  ? ?Family History:  ?His family history is negative for CAD.  ? ?Allergies: ?No Known Allergies  ? ?Home Medications: ?Prior  to Admission medications   ?Medication Sig Start Date End Date Taking? Authorizing Provider  ?acetaminophen (TYLENOL) 325 MG tablet Place 650 mg into feeding tube every 6 (six) hours as needed for mild pain.    Pro

## 2021-05-01 NOTE — Progress Notes (Signed)
eLink Physician-Brief Progress Note ?Patient Name: [name] [name] ?DOB: 07-Dec-1945 ?MRN: [id number] ? ? ?Date of Service ? 05/01/2021  ?HPI/Events of Note ? PHOS 1.7, patient is on CRRT.  ?eICU Interventions ? Sodium Phosphate 10 meq iv x 1 ordered.  ? ? ? ?  ? ?[name] [name] [name] ?05/01/2021, 4:54 AM ?

## 2021-05-01 NOTE — Progress Notes (Signed)
?[facility] ?Progress Note  ? ? 76 y.o. male with h/o CVA, DM, HFrEF, pAFib who presented with AMS found to have AKI and hyperkalemia with noted distended bladder -> foley with good UOP.   ?Of note pt DNR/DNI and has routine visits with Palliative care for symptom management but doesn't appear to actively be on hospice.  ? ?Assessment/ Plan:   ?AKI, severe:  suspect this is obstructive in etiology given report of > 2L of urine outpt when foley inserted.  Follow serial labs.  Not a candidate for long term dialysis in light of comorbids and functional status.  Hold entresto. Strict I/Os. ?- Had to start CRRT 4/20 because of refractory hyperkalemia after d/w pt's son. ?- Seen on CRRT pre/post/qd 500/400/2000 on 4K bath ? ?Let's stop CRRT and see where the renal function is at. No longer hyperkalemic and would like to see where his renal function is at. He had 3.9L UOP in the 1st 24hrs. Currently ~41m/hr via foley. ? ?Hyperkalemia, severe: secondary to #1 and should improve now that BOO relieved.  Rec'd na bicarb, IV ca, insulin, glucose.  Initially gave 1L NS boluses and lasix 100 IV to promote K excretion. Further fluids/diuretics as clinically indicated; caution with h/o severe CHF. Resolved with CRRT ?See above ?AMS: suspect multifactorial with h/o CVA, severe AKI, gabapentin - hold.  further w/u per primary ?HFrEF: 2019 TTE with EF 20-25% with grade 2 DD.  Caution with IV fluids.  Hold entresto w AKI. ?H/o CVA, L hemiparesis, report of h/o aphasia: hold gabapentin with AKI ?A fib: on amiodarone ? ?Subjective:   ?Tolerating CRRT; flushes of foley overnight and ~267mhr UOP ?Patient states that he's confused but denies f/c/n/v/ shortness of breath  ? ?Objective:   ?BP (!) 97/55   Pulse 75   Temp 97.9 ?F (36.6 ?C) (Bladder)   Resp 18   Ht '5\' 11"'$  (1.803 m)   Wt 79.9 kg   SpO2 92%   BMI 24.57 kg/m?  ? ?Intake/Output Summary (Last 24 hours) at 05/01/2021 0912 ?Last data filed at 05/01/2021  0900 ?Gross per 24 hour  ?Intake 1685.96 ml  ?Output 2773 ml  ?Net -1087.04 ml  ? ?Weight change: -0.8 kg ? ?Physical Exam: ?Gen: lying at 30 degrees in bed, arousable and pleasant ?Eyes: anicteric ?Neck: flat JVP ?CV: RRR, no rub, narrow complex QRS on monitor ?Abd: soft, nontender, nondistended ?Lungs: normal WOB, clear ?GU: foley draining dark  urine ?Extr:  L arm and leg hemiparesis with mild dependent edema, no R edema ?Neuro:  dysarthric but able to answer simple questions ?Skin: cool and dry ? ?Imaging: ?CT ABDOMEN PELVIS WO CONTRAST ? ?Result Date: 04/29/2021 ?CLINICAL DATA:  Sepsis.  Acute renal failure. EXAM: CT ABDOMEN AND PELVIS WITHOUT CONTRAST TECHNIQUE: Multidetector CT imaging of the abdomen and pelvis was performed following the standard protocol without IV contrast. RADIATION DOSE REDUCTION: This exam was performed according to the departmental dose-optimization program which includes automated exposure control, adjustment of the mA and/or kV according to patient size and/or use of iterative reconstruction technique. COMPARISON:  CT abdomen 05/08/2020. CT abdomen pelvis 04/26/2020. FINDINGS: Lower chest: Moderate size right and small left pleural effusions are present. There is airspace consolidation in the left lower lobe and atelectasis in the right lower lobe. Heart is enlarged, unchanged. Hepatobiliary: The liver is mildly diffusely hyperdense without focal lesion identified. Gallstones and sludge are present. No biliary ductal dilatation. Pancreas: Unremarkable. No pancreatic ductal dilatation or surrounding inflammatory changes. Spleen:  Normal in size without focal abnormality. Adrenals/Urinary Tract: There is mild bilateral hydroureteronephrosis to the level of the bladder without obstructing calculus. The bladder is markedly distended. There is mild diffuse bladder wall thickening and surrounding inflammation despite Foley catheter in the bladder. Multiple layering bladder calculi are  visualized measuring up to 5 mm. Adrenal glands are within normal limits. There are numerous bilateral renal cysts measuring up to 4.3 cm. There are additional hyperdense lesions in both kidneys with the largest in the left kidney measuring 3 cm. These are similar to the prior study. There is a single punctate 3 mm calculus in the left kidney, unchanged. Stomach/Bowel: Stomach is within normal limits. Appendix appears normal. No evidence of bowel wall thickening, distention, or inflammatory changes. There is sigmoid colon diverticulosis without evidence for acute diverticulitis. Vascular/Lymphatic: Aorta is normal in size. Infrarenal IVC filter is present. There are atherosclerotic calcifications of the aorta. Vascular stents are seen in the iliac arteries. There is a new enlarged paraesophageal lymph node measuring 13 mm short axis image 3/15. Reproductive: Prostate gland is enlarged, unchanged. Other: There is diffuse body wall edema, new from prior. There is no ascites or free air. There is thinning of the anterior abdominal wall, unchanged. Musculoskeletal: The bones are osteopenic. Multilevel degenerative changes affect the spine. Vertebral body hemangioma noted at L4. Left-sided hip screw is present. There is heterotopic ossification surrounding the left hip. Erosive changes of the distal sacrum and coccyx are unchanged. Rounded low-density collection in the soft tissues in this region measuring 12 mm appears unchanged, left of midline. IMPRESSION: 1. Diffuse bladder wall thickening with mild surrounding inflammation concerning for cystitis. 2. The bladder is markedly distended despite Foley catheter placement. Please correlate clinically. 3. Mild bilateral hydroureteronephrosis without obstructing calculus may be secondary to distended bladder. Follow-up recommended. 4. Stable nonobstructing left renal calculus. 5. The liver is diffusely hyperdense, a new finding. Findings may related to iron deposition,  copper deposition, or amiodarone hepatotoxicity. Recommend clinical correlation and follow-up. 6. Stable erosive changes of the sacrum and coccyx with stable chronic small soft fluid collection. 7. Moderate right and small left pleural effusions with left lower lobe consolidation. 8. New enlarged paraesophageal lymph node. 9. New diffuse body wall edema. 10. Stable bilateral renal lesions. 11. Aortic Atherosclerosis (ICD10-I70.0). Electronically Signed   By: Ronney Asters M.D.   On: 04/29/2021 19:08  ? ?DG Chest Port 1 View ? ?Result Date: 04/30/2021 ?CLINICAL DATA:  Altered mental status, acute renal failure. EXAM: PORTABLE CHEST 1 VIEW COMPARISON:  April 29, 2021 FINDINGS: There is stable right-sided venous catheter positioning. The cardiac silhouette is mildly enlarged and unchanged in size. Mild to moderate severity bibasilar atelectasis and/or infiltrate is seen, left greater than right. This is decreased in severity when compared to the prior study. A small left pleural effusion is also noted. This is also decreased in size. The right-sided pleural effusion seen on the prior study is no longer visualized. A radiopaque vascular stent is seen overlying the lateral aspect of the right apex. The visualized skeletal structures are unremarkable. IMPRESSION: 1. Mild to moderate severity bibasilar atelectasis and/or infiltrate, left greater than right, decreased in size when compared to the prior study. 2. Small left pleural effusion, also decreased in size when compared to the prior exam. Electronically Signed   By: Virgina Norfolk M.D.   On: 04/30/2021 18:22  ? ?DG CHEST PORT 1 VIEW ? ?Result Date: 04/29/2021 ?CLINICAL DATA:  Central line placement. EXAM: PORTABLE CHEST 1  VIEW COMPARISON:  Chest x-ray dated April 26, 2020. FINDINGS: Right internal jugular central venous catheter with tip in the proximal right atrium. Cardiomegaly. Diffuse interstitial thickening. Large left and moderate right pleural effusions.  Collapse of the lingula and left lower lobe. Patchy airspace disease in the remaining aerated left upper lobe. Partial collapse of the right lower lobe. No pneumothorax. No acute osseous abnormality. IMPRESSION: 1. Right internal

## 2021-05-02 ENCOUNTER — Inpatient Hospital Stay (HOSPITAL_COMMUNITY): Payer: Medicare Other

## 2021-05-02 DIAGNOSIS — E43 Unspecified severe protein-calorie malnutrition: Secondary | ICD-10-CM | POA: Diagnosis not present

## 2021-05-02 DIAGNOSIS — N179 Acute kidney failure, unspecified: Secondary | ICD-10-CM | POA: Diagnosis not present

## 2021-05-02 LAB — RENAL FUNCTION PANEL
Albumin: 2 g/dL — ABNORMAL LOW (ref 3.5–5.0)
Albumin: 1.8 g/dL — ABNORMAL LOW (ref 3.5–5.0)
Anion gap: 6 (ref 5–15)
Anion gap: 8 (ref 5–15)
BUN: 12 mg/dL (ref 8–23)
BUN: 14 mg/dL (ref 8–23)
CO2: 26 mmol/L (ref 22–32)
CO2: 28 mmol/L (ref 22–32)
Calcium: 9.7 mg/dL (ref 8.9–10.3)
Calcium: 9.7 mg/dL (ref 8.9–10.3)
Chloride: 105 mmol/L (ref 98–111)
Chloride: 103 mmol/L (ref 98–111)
Creatinine, Ser: 2.1 mg/dL — ABNORMAL HIGH (ref 0.61–1.24)
Creatinine, Ser: 2.23 mg/dL — ABNORMAL HIGH (ref 0.61–1.24)
GFR, Estimated: 32 mL/min — ABNORMAL LOW (ref >60)
GFR, Estimated: 30 mL/min — ABNORMAL LOW (ref >60)
Glucose, Bld: 106 mg/dL — ABNORMAL HIGH (ref 70–99)
Glucose, Bld: 85 mg/dL (ref 70–99)
Phosphorus: 2.8 mg/dL (ref 2.5–4.6)
Phosphorus: 2.8 mg/dL (ref 2.5–4.6)
Potassium: 3.9 mmol/L (ref 3.5–5.1)
Potassium: 3.5 mmol/L (ref 3.5–5.1)
Sodium: 137 mmol/L (ref 135–145)
Sodium: 139 mmol/L (ref 135–145)

## 2021-05-02 LAB — HEPATIC FUNCTION PANEL
ALT: 91 U/L — ABNORMAL HIGH (ref 0–44)
AST: 63 U/L — ABNORMAL HIGH (ref 15–41)
Albumin: 1.9 g/dL — ABNORMAL LOW (ref 3.5–5.0)
Alkaline Phosphatase: 64 U/L (ref 38–126)
Bilirubin, Direct: 0.2 mg/dL (ref 0.0–0.2)
Indirect Bilirubin: 0.5 mg/dL (ref 0.3–0.9)
Total Bilirubin: 0.7 mg/dL (ref 0.3–1.2)
Total Protein: 5.1 g/dL — ABNORMAL LOW (ref 6.5–8.1)

## 2021-05-02 LAB — MAGNESIUM: Magnesium: 2.4 mg/dL (ref 1.7–2.4)

## 2021-05-02 LAB — CBC
HCT: 38.5 % — ABNORMAL LOW (ref 39.0–52.0)
Hemoglobin: 12.3 g/dL — ABNORMAL LOW (ref 13.0–17.0)
MCH: 26.1 pg (ref 26.0–34.0)
MCHC: 31.9 g/dL (ref 30.0–36.0)
MCV: 81.7 fL (ref 80.0–100.0)
Platelets: 312 10*3/uL (ref 150–400)
RBC: 4.71 MIL/uL (ref 4.22–5.81)
RDW: 16.2 % — ABNORMAL HIGH (ref 11.5–15.5)
WBC: 10.2 10*3/uL (ref 4.0–10.5)
nRBC: 0 % (ref 0.0–0.2)

## 2021-05-02 LAB — APTT: aPTT: 38 seconds — ABNORMAL HIGH (ref 24–36)

## 2021-05-02 MED ORDER — MIDODRINE HCL 5 MG PO TABS
5.0000 mg | ORAL_TABLET | Freq: Three times a day (TID) | ORAL | Status: DC
  Administered 2021-05-02 (×2): 5 mg via ORAL
  Filled 2021-05-02 (×3): qty 1

## 2021-05-02 MED ORDER — SENNOSIDES 8.8 MG/5ML PO SYRP
10.0000 mL | ORAL_SOLUTION | Freq: Once | ORAL | Status: AC
  Administered 2021-05-02: 10 mL via ORAL
  Filled 2021-05-02: qty 10

## 2021-05-02 MED ORDER — PROCHLORPERAZINE EDISYLATE 10 MG/2ML IJ SOLN
5.0000 mg | Freq: Once | INTRAMUSCULAR | Status: DC
  Filled 2021-05-02: qty 1

## 2021-05-02 NOTE — Progress Notes (Addendum)
? ?NAME:  [name] [name], MRN:  [id number], DOB:  05-26-1945, LOS: 3 ?ADMISSION DATE:  04/29/2021 CONSULTATION DATE:  04/29/2021 ?REFERRING MD:  Sedonia Small - EDP CHIEF COMPLAINT:  Hyperkalemia, ARF  ? ?History of Present Illness:  ?76 year old man who presented to [facility] ED 4/20 with AMS x 2 days. Per SNF where patient resides, usually A&Ox4. Sent for outpatient labs which were abnormal. 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 ? ?On ED arrival, patient was drowsy, but appropriately answering questions. Denies fever, reports chills. Denies CP/SOB, n/v/d/ or changes in bowel habits. Denies decreased UOP or LUTS, denies abdominal pain. Endorses some confusion, dizziness and weakness. Labs notable for Na 134, K > 7.5, Cr 11.42 (baseline 1.3-1.5), transaminitis (AST 128, ALT 164). Lipase normal, CK 39. CBC grossly unremarkable. EKG with concern for hyperK-related changes and calcium/bicarb initiated. UA grossly negative; however, on physical exam patient's lower abdomen was significantly distended, raising concern for bladder outlet obstruction. Foley was placed with 2L dark UOP. Nephrology was consulted in the setting of severe hyperkalemia/ARF. ? ?PCCM consulted for ICU admission in the setting of severe ARF. ? ?Pertinent Medical History:  ? ?Past Medical History:  ?Diagnosis Date  ? Chronic systolic CHF (congestive heart failure) ([place])   ? CVA, old, aphasia   ? Diabetes ([place])   ? Hemiplegia ([place])   ? HTN (hypertension)   ? Intestinal obstruction ([place]) 11/2015  ? Peripheral vascular disease ([place])   ? Pneumonia   ? Stroke [facility])   ? ?Significant Hospital Events: ?Including procedures, antibiotic start and stop dates in addition to other pertinent events   ?4/20 - Presented from SNF with AMS x 1-2 days. Found to be hyperkalemic to > 7.5 with ARF (Cr 11, baseline 1.3-1.5). EKG changes and  bicarb/Ca given. C/f bladder outlet obstruction, Foley placed with 2L output. Nephro consulted. PCCM to admit. ?4/21-arterial line placed, central venous catheter placed ?4/21-started on CRRT. ?4/21-right-sided thoracentesis for 800 cc of amber-colored fluid-transudative ?4/22 CRRT will be held today ?4/22 CRRT remains on hold, some nausea ? ?Interim History / Subjective:  ?Some nausea overnight, CRRT on hold ? ?Objective:  ?Blood pressure (!) 111/48, pulse 66, temperature 98.4 ?F (36.9 ?C), temperature source Bladder, resp. rate 15, height '5\' 11"'$  (1.803 m), weight 80.2 kg, SpO2 100 %. ?   ?   ? ?Intake/Output Summary (Last 24 hours) at 05/02/2021 0811 ?Last data filed at 05/02/2021 0700 ?Gross per 24 hour  ?Intake 878.5 ml  ?Output 619 ml  ?Net 259.5 ml  ? ?Filed Weights  ? 04/30/21 0445 05/01/21 0500 05/02/21 0500  ?Weight: 80.7 kg 79.9 kg 80.2 kg  ? ?Physical Examination: ?General: Chronically ill-appearing ?HEENT: Dry mucous membranes ?Neuro: Remains aphasic, generalized weakness ?CV: S1-S2 appreciated ?PULM: Bilateral rales at bases ?GI: Soft, bowel sounds appreciated ?Extremities: Trace extremity edema ?Skin: Warm/dry, bilateral feet with significant callous/skin darkening 2/2 PVD. ? ?Resolved Hospital Problem List:  ? ? ?Assessment & Plan:  ? ?Acute kidney injury secondary to bladder outlet obstruction ?Severe hyperkalemia ?-Tolerated CRRT well, CRRT on hold ?-Maintaining good urine output ?-Maintain renal perfusion, avoid nephrotoxic agents ? ?Altered mental status ?-Multifactorial ?-Gabapentin reinitiated at lower dose ? ?Transaminitis ?-Amiodarone on hold for transaminitis ?-Hold iron supplementation ? ?Heart failure with reduced ejection fraction ?History of atrial fibrillation ?-Most recent echocardiogram with ejection fraction of 20 to 17%, diastolic dysfunction ?-Hold amiodarone ? ?Prior CVA with residual  deficits ?Dysphagia ?-Previously had a PEG tube ?-Tolerating orally ?-Nausea, switched to  Compazine ?-Appears he was on Phenergan for nausea/vomiting from his records ? ?Goals of care ?-Remains a full code at present ?-Son Kester Stimpson and daughter [name] involved in decision making for Mr. Muniz at present ? ?Goals of care ?-Is a full code at present ?-Son Jaymir Struble and daughter [name] involved in decision making for Mr. Weisel at present ? ? ?Large right pleural effusion ?-Tolerated thoracentesis well ?-Transudative effusion with significant improvement in chest x-ray findings ? ?Concern for possible pneumonia ?Possible aspiration pneumonia ?-No fever ?-No leukocytosis ?-I will discontinue Rocephin today as he is not manifesting any signs of ongoing infection at present ?-Monitor closely ? ?With vomiting this morning ?-Obtain a KUB ?-Abdomen is soft, nontender ?Has not moved his bowels ? ?Best Practice: (right click and "Reselect all SmartList Selections" daily)  ? ?Diet/type: Regular consistency (see orders) ?DVT prophylaxis: prophylactic heparin  ?GI prophylaxis: PPI ?Lines: N/A ?Foley:  Yes, and it is still needed ?Code Status: Full code ?Last date of multidisciplinary goals of care discussion [full code at present] ? ?Labs:  ?CBC: ?Recent Labs  ?Lab 04/29/21 ?0150 04/29/21 ?1641 04/29/21 ?1654 04/30/21 ?0200 05/01/21 ?0210 05/02/21 ?0301  ?WBC 5.4 6.2  --  10.1 11.5* 10.2  ?HGB 14.5 12.1*  --  12.5* 12.5* 12.3*  ?HCT 44.3 36.2*  --  36.6* 38.2* 38.5*  ?MCV 80.0 80.8  --  79.2* 80.8 81.7  ?PLT 441* 326 315 335 306 312  ? ? ?Basic Metabolic Panel: ?Recent Labs  ?Lab 04/30/21 ?0200 04/30/21 ?0757 04/30/21 ?1608 04/30/21 ?1933 05/01/21 ?0210 05/01/21 ?0214 05/01/21 ?1516 05/02/21 ?0301  ?NA 139   < > 138 138  --  137 137 137  ?K 3.9   < > 3.9 3.8  --  3.8 3.8 3.9  ?CL 103   < > 104 106  --  105 104 105  ?CO2 29   < > 27 26  --  '27 27 26  '$ ?GLUCOSE 173*   < > 131* 134*  --  143* 103* 106*  ?BUN 48*   < > 22 20  --  '14 11 12  '$ ?CREATININE 5.91*   < > 3.19* 2.74*  --  2.12* 1.89* 2.10*   ?CALCIUM 8.8*   < > 8.5* 8.4*  --  8.6* 9.0 9.7  ?MG 2.9*  --   --   --  2.5*  --   --  2.4  ?PHOS 3.4  --  2.4*  --   --  1.7* 2.2* 2.8  ? < > = values in this interval not displayed.  ? ?GFR: ?Estimated Creatinine Clearance: 32.4 mL/min (A) (by C-G formula based on SCr of 2.1 mg/dL (H)). ?Recent Labs  ?Lab 04/29/21 ?1153 04/29/21 ?1358 04/29/21 ?1641 04/29/21 ?1654 04/29/21 ?1812 04/30/21 ?0200 05/01/21 ?0210 05/02/21 ?0301  ?WBC  --   --  6.2  --   --  10.1 11.5* 10.2  ?LATICACIDVEN 2.2* 2.5*  --  2.5* 1.7  --   --   --   ? ?Liver Function Tests: ?Recent Labs  ?Lab 04/29/21 ?0150 04/30/21 ?0200 04/30/21 ?1608 05/01/21 ?0214 05/01/21 ?1516 05/02/21 ?0301  ?AST 128*  --   --   --   --   --   ?ALT 164*  --   --   --   --   --   ?ALKPHOS 87  --   --   --   --   --   ?  BILITOT 0.8  --   --   --   --   --   ?PROT 6.4*  --   --   --   --   --   ?ALBUMIN 2.5* 2.1* 2.1* 2.0* 1.9* 2.0*  ? ?Recent Labs  ?Lab 04/29/21 ?0150  ?LIPASE 29  ? ?No results for input(s): AMMONIA in the last 168 hours. ? ?ABG: ?No results found for: PHART, PCO2ART, PO2ART, HCO3, TCO2, ACIDBASEDEF, O2SAT  ? ?Coagulation Profile: ?Recent Labs  ?Lab 04/29/21 ?1654  ?INR 1.3*  ? ? ?Cardiac Enzymes: ?Recent Labs  ?Lab 04/29/21 ?0150  ?CKTOTAL 39*  ? ?HbA1C: ?No results found for: HGBA1C ? ?CBG: ?Recent Labs  ?Lab 04/30/21 ?1116 04/30/21 ?1523 04/30/21 ?1935 04/30/21 ?2328 05/01/21 ?0336  ?GLUCAP 158* 128* 114* 130* 114*  ? ? ?Review of Systems:   ?Review of systems completed with pertinent positives/negatives outlined in above HPI. ? ?Past Medical History:  ?He,  has a past medical history of Chronic systolic CHF (congestive heart failure) ([place]), CVA, old, aphasia, Diabetes ([place]), Hemiplegia ([place]), HTN (hypertension), Intestinal obstruction ([place]) (11/2015), Peripheral vascular disease ([place]), Pneumonia, and Stroke ([place]).  ? ?Surgical History:  ? ?Past Surgical History:  ?Procedure Laterality Date  ? ABDOMINAL ADHESION SURGERY  11/2015  ? PERIPHERAL  VASCULAR CATHETERIZATION    ? R-EIA stent, possibly R-Subclavian stent 2017  ?  ?Social History:  ? reports that he has quit smoking. He has never used smokeless tobacco. He reports that he does not drink alcohol and does not [place]

## 2021-05-02 NOTE — Progress Notes (Signed)
?[facility] ?Progress Note  ? ? 76 y.o. male with h/o CVA, DM, HFrEF, pAFib who presented with AMS found to have AKI and hyperkalemia with noted distended bladder -> foley with good UOP.   ?Of note pt DNR/DNI and has routine visits with Palliative care for symptom management but doesn't appear to actively be on hospice.  ? ?Assessment/ Plan:   ?AKI, severe:  suspect this is obstructive in etiology given report of > 2L of urine outpt when foley inserted.  Follow serial labs.  Not a candidate for long term dialysis in light of comorbids and functional status.  Hold entresto. Strict I/Os. ?- Had to start CRRT 4/20-4/22  because of refractory hyperkalemia after d/w pt's son. ? ?Giving trial off CRRT to  see where the renal function is at. No longer hyperkalemic. He had 3.9L UOP in the 1st 24hrs after foley was placed. Currently ~27m/hr via foley. ? ?Will have nurse scan bladder to make sure foley doesn't need to be flushed. Still certainly edematous. ? ?No absolute indication for RRT and the patient appears to be  comfortable. ? ?Hyperkalemia, severe: secondary to #1 + rec'd na bicarb, IV ca, insulin, glucose.  Initially gave 1L NS boluses and lasix 100 IV to promote K excretion. Further fluids/diuretics as clinically indicated; caution with h/o severe CHF. Resolved with CRRT (now off) ?See above ?AMS: suspect multifactorial with h/o CVA, severe AKI, gabapentin - hold.  further w/u per primary ?HFrEF: 2019 TTE with EF 20-25% with grade 2 DD.  Caution with IV fluids.  Hold entresto w AKI. ?H/o CVA, L hemiparesis, report of h/o aphasia: hold gabapentin with AKI ?A fib: on amiodarone ? ?Subjective:   ?Confused but pleasant.  ?~338mhr UOP ?Patient denies f/c/ shortness of breath  ?+ nausea ? ?Objective:   ?BP (!) 119/55   Pulse 64   Temp 98.4 ?F (36.9 ?C) (Bladder)   Resp 14   Ht '5\' 11"'$  (1.803 m)   Wt 80.2 kg   SpO2 100%   BMI 24.66 kg/m?  ? ?Intake/Output Summary (Last 24 hours) at 05/02/2021  0947 ?Last data filed at 05/02/2021 0900 ?Gross per 24 hour  ?Intake 849.54 ml  ?Output 407 ml  ?Net 442.54 ml  ? ?Weight change: 0.3 kg ? ?Physical Exam: ?Gen: lying at 30 degrees in bed, arousable and pleasant ?Eyes: anicteric ?Neck: flat JVP ?CV: RRR, no rub, narrow complex QRS on monitor ?Abd: soft, nontender, nondistended ?Lungs: normal WOB, clear ?GU: foley draining dark  urine ?Extr:  L arm and leg hemiparesis with mild dependent edema, no R edema ?Neuro:  dysarthric but able to answer simple questions ?Skin: cool and dry ? ?Imaging: ?DG Chest Port 1 View ? ?Result Date: 04/30/2021 ?CLINICAL DATA:  Altered mental status, acute renal failure. EXAM: PORTABLE CHEST 1 VIEW COMPARISON:  April 29, 2021 FINDINGS: There is stable right-sided venous catheter positioning. The cardiac silhouette is mildly enlarged and unchanged in size. Mild to moderate severity bibasilar atelectasis and/or infiltrate is seen, left greater than right. This is decreased in severity when compared to the prior study. A small left pleural effusion is also noted. This is also decreased in size. The right-sided pleural effusion seen on the prior study is no longer visualized. A radiopaque vascular stent is seen overlying the lateral aspect of the right apex. The visualized skeletal structures are unremarkable. IMPRESSION: 1. Mild to moderate severity bibasilar atelectasis and/or infiltrate, left greater than right, decreased in size when compared to the prior study.  2. Small left pleural effusion, also decreased in size when compared to the prior exam. Electronically Signed   By: Virgina Norfolk M.D.   On: 04/30/2021 18:22   ? ?Labs: ?BMET ?Recent Labs  ?Lab 04/30/21 ?0200 04/30/21 ?0757 04/30/21 ?1353 04/30/21 ?1608 04/30/21 ?1933 05/01/21 ?0214 05/01/21 ?1516 05/02/21 ?0301  ?NA 139 138 137 138 138 137 137 137  ?K 3.9 3.7 3.7 3.9 3.8 3.8 3.8 3.9  ?CL 103 105 104 104 106 105 104 105  ?CO2 '29 27 27 27 26 27 27 26  '$ ?GLUCOSE 173* 136* 125* 131*  134* 143* 103* 106*  ?BUN 48* 34* 25* '22 20 14 11 12  '$ ?CREATININE 5.91* 4.36* 3.39* 3.19* 2.74* 2.12* 1.89* 2.10*  ?CALCIUM 8.8* 8.7* 8.5* 8.5* 8.4* 8.6* 9.0 9.7  ?PHOS 3.4  --   --  2.4*  --  1.7* 2.2* 2.8  ? ?CBC ?Recent Labs  ?Lab 04/29/21 ?1641 04/29/21 ?1654 04/30/21 ?0200 05/01/21 ?0210 05/02/21 ?0301  ?WBC 6.2  --  10.1 11.5* 10.2  ?HGB 12.1*  --  12.5* 12.5* 12.3*  ?HCT 36.2*  --  36.6* 38.2* 38.5*  ?MCV 80.8  --  79.2* 80.8 81.7  ?PLT 326 315 335 306 312  ? ? ?Medications:   ? ? B-complex with vitamin C  1 tablet Oral Daily  ? chlorhexidine  15 mL Mouth Rinse BID  ? Chlorhexidine Gluconate Cloth  6 each Topical Daily  ? famotidine  20 mg Oral Daily  ? gabapentin  100 mg Oral BID  ? heparin  5,000 Units Subcutaneous Q8H  ? lactose free nutrition  237 mL Oral TID BM  ? mouth rinse  15 mL Mouth Rinse q12n4p  ? midodrine  5 mg Oral TID WC  ? prochlorperazine  5 mg Intravenous Once  ? sodium chloride flush  10-40 mL Intracatheter Q12H  ? ? ? ? ?Otelia Santee, MD ?05/02/2021, 9:47 AM  ? ?

## 2021-05-02 NOTE — Progress Notes (Signed)
eLink Physician-Brief Progress Note ?Patient Name: [name] [name] ?DOB: 29-Oct-1945 ?MRN: [id number] ? ? ?Date of Service ? 05/02/2021  ?HPI/Events of Note ? Vomiting. RN noted prolonged Qtc on monitor so not using Zofran which is ordered  ?eICU Interventions ? Try Compazine  ? ? ? ?Intervention Category ?Minor Interventions: Routine modifications to care plan (e.g. PRN medications for pain, fever) ? ?Jacori Mulrooney G Shenequa Howse ?05/02/2021, 3:42 AM ?

## 2021-05-03 DIAGNOSIS — R339 Retention of urine, unspecified: Secondary | ICD-10-CM

## 2021-05-03 DIAGNOSIS — E43 Unspecified severe protein-calorie malnutrition: Secondary | ICD-10-CM

## 2021-05-03 DIAGNOSIS — N179 Acute kidney failure, unspecified: Secondary | ICD-10-CM | POA: Diagnosis not present

## 2021-05-03 LAB — CBC
HCT: 33.4 % — ABNORMAL LOW (ref 39.0–52.0)
Hemoglobin: 11 g/dL — ABNORMAL LOW (ref 13.0–17.0)
MCH: 26.4 pg (ref 26.0–34.0)
MCHC: 32.9 g/dL (ref 30.0–36.0)
MCV: 80.1 fL (ref 80.0–100.0)
Platelets: 285 10*3/uL (ref 150–400)
RBC: 4.17 MIL/uL — ABNORMAL LOW (ref 4.22–5.81)
RDW: 16.3 % — ABNORMAL HIGH (ref 11.5–15.5)
WBC: 6.8 10*3/uL (ref 4.0–10.5)
nRBC: 0 % (ref 0.0–0.2)

## 2021-05-03 LAB — RENAL FUNCTION PANEL
Albumin: 1.9 g/dL — ABNORMAL LOW (ref 3.5–5.0)
Anion gap: 5 (ref 5–15)
BUN: 14 mg/dL (ref 8–23)
CO2: 28 mmol/L (ref 22–32)
Calcium: 9.6 mg/dL (ref 8.9–10.3)
Chloride: 107 mmol/L (ref 98–111)
Creatinine, Ser: 2.26 mg/dL — ABNORMAL HIGH (ref 0.61–1.24)
GFR, Estimated: 30 mL/min — ABNORMAL LOW (ref >60)
Glucose, Bld: 83 mg/dL (ref 70–99)
Phosphorus: 2.8 mg/dL (ref 2.5–4.6)
Potassium: 3.3 mmol/L — ABNORMAL LOW (ref 3.5–5.1)
Sodium: 140 mmol/L (ref 135–145)

## 2021-05-03 LAB — GLUCOSE, CAPILLARY
Glucose-Capillary: 68 mg/dL — ABNORMAL LOW (ref 70–99)
Glucose-Capillary: 71 mg/dL (ref 70–99)
Glucose-Capillary: 86 mg/dL (ref 70–99)
Glucose-Capillary: 110 mg/dL — ABNORMAL HIGH (ref 70–99)
Glucose-Capillary: 110 mg/dL — ABNORMAL HIGH (ref 70–99)
Glucose-Capillary: 153 mg/dL — ABNORMAL HIGH (ref 70–99)

## 2021-05-03 LAB — BODY FLUID CULTURE W GRAM STAIN: Culture: NO GROWTH

## 2021-05-03 LAB — MAGNESIUM: Magnesium: 2.3 mg/dL (ref 1.7–2.4)

## 2021-05-03 LAB — APTT: aPTT: 40 seconds — ABNORMAL HIGH (ref 24–36)

## 2021-05-03 LAB — CYTOLOGY - NON PAP

## 2021-05-03 MED ORDER — METOCLOPRAMIDE HCL 5 MG/5ML PO SOLN
5.0000 mg | Freq: Three times a day (TID) | ORAL | Status: DC
  Filled 2021-05-03: qty 10

## 2021-05-03 MED ORDER — METOCLOPRAMIDE HCL 5 MG/ML IJ SOLN
5.0000 mg | Freq: Three times a day (TID) | INTRAMUSCULAR | Status: DC
  Administered 2021-05-03 – 2021-05-07 (×16): 5 mg via INTRAVENOUS
  Filled 2021-05-03 (×16): qty 2

## 2021-05-03 MED ORDER — DEXTROSE 10 % IV SOLN: INTRAVENOUS | Status: DC

## 2021-05-03 NOTE — Progress Notes (Signed)
? ?NAME:  [name] [name], MRN:  [id number], DOB:  Feb 04, 1945, LOS: 4 ?ADMISSION DATE:  04/29/2021 CONSULTATION DATE:  04/29/2021 ?REFERRING MD:  Sedonia Small - EDP CHIEF COMPLAINT:  Hyperkalemia, ARF  ? ?History of Present Illness:  ?76 year old man who presented to [facility] ED 4/20 with AMS x 2 days. Per SNF where patient resides, usually A&Ox4. Sent for outpatient labs which were abnormal. 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 ? ?On ED arrival, patient was drowsy, but appropriately answering questions. Denies fever, reports chills. Denies CP/SOB, n/v/d/ or changes in bowel habits. Denies decreased UOP or LUTS, denies abdominal pain. Endorses some confusion, dizziness and weakness. Labs notable for Na 134, K > 7.5, Cr 11.42 (baseline 1.3-1.5), transaminitis (AST 128, ALT 164). Lipase normal, CK 39. CBC grossly unremarkable. EKG with concern for hyperK-related changes and calcium/bicarb initiated. UA grossly negative; however, on physical exam patient's lower abdomen was significantly distended, raising concern for bladder outlet obstruction. Foley was placed with 2L dark UOP. Nephrology was consulted in the setting of severe hyperkalemia/ARF. ? ?PCCM consulted for ICU admission in the setting of severe ARF. ? ?Pertinent Medical History:  ? ?Past Medical History:  ?Diagnosis Date  ? Chronic systolic CHF (congestive heart failure) ([place])   ? CVA, old, aphasia   ? Diabetes ([place])   ? Hemiplegia ([place])   ? HTN (hypertension)   ? Intestinal obstruction ([place]) 11/2015  ? Peripheral vascular disease ([place])   ? Pneumonia   ? Stroke [facility])   ? ?Significant Hospital Events: ?Including procedures, antibiotic start and stop dates in addition to other pertinent events   ?4/20 - Presented from SNF with AMS x 1-2 days. Found to be hyperkalemic to > 7.5 with ARF (Cr 11, baseline 1.3-1.5). EKG changes and  bicarb/Ca given. C/f bladder outlet obstruction, Foley placed with 2L output. Nephro consulted. PCCM to admit. ?4/21-arterial line placed, central venous catheter placed ?4/21-started on CRRT. ?4/21-right-sided thoracentesis for 800 cc of amber-colored fluid-transudative ?4/22 CRRT will be held today ?4/22 CRRT remains on hold, some nausea ?4/24-off CRRT, making urine ? ?Interim History / Subjective:  ? ?Denies any significant complaints at the present time ?Does not appear to be in pain or discomfort ?Does not want to eat or need anything at present ? ?Objective:  ?Blood pressure (!) 127/55, pulse 61, temperature (!) 97 ?F (36.1 ?C), resp. rate 14, height '5\' 11"'$  (1.803 m), weight 77.7 kg, SpO2 98 %. ?   ?   ? ?Intake/Output Summary (Last 24 hours) at 05/03/2021 0843 ?Last data filed at 05/03/2021 0600 ?Gross per 24 hour  ?Intake 319.95 ml  ?Output 935 ml  ?Net -615.05 ml  ? ?Filed Weights  ? 05/01/21 0500 05/02/21 0500 05/03/21 0500  ?Weight: 79.9 kg 80.2 kg 77.7 kg  ? ?Physical Examination: ?General: Chronically ill-appearing ?HEENT: Dry mucous membranes ?Neuro: Aphasic, generalized weakness ?CV: S1-S2 appreciated ?PULM: Bibasal rales  ?GI: Soft, bowel sounds appreciated ?Extremities: Bilateral lower extremity edema ?Skin: Skin is warm and dry  ? ?Resolved Hospital Problem List:  ? ? ?Assessment & Plan:  ? ?Acute kidney injury secondary to bladder outlet obstruction ?Hyperkalemia resolved ?-Was on CRRT, held 4/22 ?-He is making urine with stable electrolytes ?-No indication for further CRRT or dialysis at present ?-Maintain renal perfusion, avoid nephrotoxic agents ?-Discussed with renal ? ?Altered mental status ?-This is multifactorial ?-Home dose gabapentin reduced ? ?Transaminitis ?-Amiodarone was held  for transaminitis ?-Levels improving ?-Amiodarone may be started at a lower dose ?-Continue to hold iron supplementation ? ?Heart failure with reduced ejection fraction ?History of atrial fibrillation ?-Most recent  echo shows ejection fraction of 20 to 51%, diastolic dysfunction ?-May reinitiate amiodarone at a lower dose once kidney function settle better ? ?Prior CVA with residual deficits ?Dysphagia ?-Tolerating orally ?-Compazine for nausea ?-Was on Phenergan at home ? ?Goals of care ?-Remains a full code at present ?-Son Cobe Viney and daughter [name] involved in decision making for Mr. [place]ander at present ? ?Large right pleural effusion ?-Tolerated thoracentesis well ?-Transudative effusion ? ?Concern for possible pneumonia ?Possible aspiration pneumonia ?-No fever ?-No leukocytosis ?Rocephin discontinued 4/23 ? ?With vomiting 4/23 ?-KUB suggestive of IBS ?-He is moving his bowels ?-Add Reglan ? ?Will consider transitioning out of the unit if he were to remain stable ? ?Best Practice: (right click and "Reselect all SmartList Selections" daily)  ? ?Diet/type: Regular consistency (see orders) ?DVT prophylaxis: prophylactic heparin  ?GI prophylaxis: PPI ?Lines: N/A ?Foley:  Yes, and it is still needed ?Code Status: Full code ?Last date of multidisciplinary goals of care discussion [full code at present] ? ?Labs:  ?CBC: ?Recent Labs  ?Lab 04/29/21 ?1641 04/29/21 ?1654 04/30/21 ?0200 05/01/21 ?0210 05/02/21 ?0301 05/03/21 ?8841  ?WBC 6.2  --  10.1 11.5* 10.2 6.8  ?HGB 12.1*  --  12.5* 12.5* 12.3* 11.0*  ?HCT 36.2*  --  36.6* 38.2* 38.5* 33.4*  ?MCV 80.8  --  79.2* 80.8 81.7 80.1  ?PLT 326 315 335 306 312 285  ? ? ?Basic Metabolic Panel: ?Recent Labs  ?Lab 04/30/21 ?0200 04/30/21 ?6606 05/01/21 ?0210 05/01/21 ?0214 05/01/21 ?1516 05/02/21 ?0301 05/02/21 ?1630 05/03/21 ?0358  ?NA 139   < >  --  137 137 137 139 140  ?K 3.9   < >  --  3.8 3.8 3.9 3.5 3.3*  ?CL 103   < >  --  105 104 105 103 107  ?CO2 29   < >  --  '27 27 26 28 28  '$ ?GLUCOSE 173*   < >  --  143* 103* 106* 85 83  ?BUN 48*   < >  --  '14 11 12 14 14  '$ ?CREATININE 5.91*   < >  --  2.12* 1.89* 2.10* 2.23* 2.26*  ?CALCIUM 8.8*   < >  --  8.6* 9.0 9.7 9.7 9.6  ?MG  2.9*  --  2.5*  --   --  2.4  --  2.3  ?PHOS 3.4   < >  --  1.7* 2.2* 2.8 2.8 2.8  ? < > = values in this interval not displayed.  ? ?GFR: ?Estimated Creatinine Clearance: 30.1 mL/min (A) (by C-G formula based on SCr of 2.26 mg/dL (H)). ?Recent Labs  ?Lab 04/29/21 ?1153 04/29/21 ?1358 04/29/21 ?1641 04/29/21 ?1654 04/29/21 ?1812 04/30/21 ?0200 05/01/21 ?0210 05/02/21 ?0301 05/03/21 ?0357  ?WBC  --   --    < >  --   --  10.1 11.5* 10.2 6.8  ?LATICACIDVEN 2.2* 2.5*  --  2.5* 1.7  --   --   --   --   ? < > = values in this interval not displayed.  ? ?Liver Function Tests: ?Recent Labs  ?Lab 04/29/21 ?0150 04/30/21 ?0200 05/01/21 ?1516 05/02/21 ?0301 05/02/21 ?1000 05/02/21 ?1630 05/03/21 ?0358  ?AST 128*  --   --   --  63*  --   --   ?ALT  164*  --   --   --  91*  --   --   ?ALKPHOS 87  --   --   --  64  --   --   ?BILITOT 0.8  --   --   --  0.7  --   --   ?PROT 6.4*  --   --   --  5.1*  --   --   ?ALBUMIN 2.5*   < > 1.9* 2.0* 1.9* 1.8* 1.9*  ? < > = values in this interval not displayed.  ? ?Recent Labs  ?Lab 04/29/21 ?0150  ?LIPASE 29  ? ?No results for input(s): AMMONIA in the last 168 hours. ? ?ABG: ?No results found for: PHART, PCO2ART, PO2ART, HCO3, TCO2, ACIDBASEDEF, O2SAT  ? ?Coagulation Profile: ?Recent Labs  ?Lab 04/29/21 ?1654  ?INR 1.3*  ? ? ?Cardiac Enzymes: ?Recent Labs  ?Lab 04/29/21 ?0150  ?CKTOTAL 39*  ? ?HbA1C: ?No results found for: HGBA1C ? ?CBG: ?Recent Labs  ?Lab 04/30/21 ?2328 05/01/21 ?0336 05/03/21 ?9983 05/03/21 ?3825 05/03/21 ?0745  ?GLUCAP 130* 114* 68* 71 86  ? ? ?Review of Systems:   ?Review of systems completed with pertinent positives/negatives outlined in above HPI. ? ?Past Medical History:  ?He,  has a past medical history of Chronic systolic CHF (congestive heart failure) ([place]), CVA, old, aphasia, Diabetes ([place]), Hemiplegia ([place]), HTN (hypertension), Intestinal obstruction ([place]) (11/2015), Peripheral vascular disease ([place]), Pneumonia, and Stroke ([place]).  ? ?Surgical History:  ? ?Past  Surgical History:  ?Procedure Laterality Date  ? ABDOMINAL ADHESION SURGERY  11/2015  ? PERIPHERAL VASCULAR CATHETERIZATION    ? R-EIA stent, possibly R-Subclavian stent 2017  ?  ?Social History:  ? reports that

## 2021-05-03 NOTE — Progress Notes (Signed)
Hypoglycemic Event ? ?CBG: 68 ? ?Treatment: 4 oz juice/soda ? ?Symptoms: None ? ?Follow-up CBG: Time: 0541 CBG Result: 71 ? ?Possible Reasons for Event: Inadequate meal intake ? ?Comments/MD notified: MD notified. Awaiting orders ? ? ? ?[name] [name] ? ? ?

## 2021-05-03 NOTE — Progress Notes (Signed)
SLP Cancellation Note ? ?Patient Details ?Name: [name] [name] ?MRN: [id number] ?DOB: [birth year] ? ? ?Cancelled treatment:       Reason Eval/Treat Not Completed: Other (comment). Pt reports nausea at this time. He says he does not want his breakfast. When asked if the type of food he is getting is satisfactory he responded no. When asked if there is something I can do to make them more appropriate pt responded no. When asked if there is any other food he may want he responded no. When SLP asked if he wanted me to leave he responded yes. RN confirmed he has been refusing meds and meals this am.  Will sign off at this time as pt has demonstrated tolerance of current food and liquid texture when he is willing to eat. Resistance appears to be more related to nausea/behavior/emotions. Please reorder as needed.  ? ?Herbie Baltimore, MA CCC-SLP  ?Acute Rehabilitation Services ?[employer] ?Office [phone] ? ?Perlita Forbush, Katherene Ponto ?05/03/2021, 9:16 AM ?

## 2021-05-03 NOTE — Progress Notes (Signed)
Updated son at bedside ?

## 2021-05-03 NOTE — Progress Notes (Signed)
Updated daughter ? ?Patient just wants to be left alone ? ?Does not want bothered, refusing meds and food ?

## 2021-05-03 NOTE — Progress Notes (Signed)
eLink Physician-Brief Progress Note ?Patient Name: [name] [name] ?DOB: 01/29/1945 ?MRN: [id number] ? ? ?Date of Service ? 05/03/2021  ?HPI/Events of Note ? Hypoglycemia and not eating his meals.   ?eICU Interventions ? Add d10 and glucose checks every 2 hours x 4 ?Day team to modify this based on levels   ? ? ? ?Intervention Category ?Major Interventions: Hyperglycemia - active titration of insulin therapy ? ?[name] ?05/03/2021, 5:54 AM ?

## 2021-05-03 NOTE — Progress Notes (Signed)
?[facility] ?Progress Note  ? ? 76 y.o. male with h/o CVA, DM, HFrEF, pAFib who presented with AMS found to have AKI 2/2 BOO and hyperkalemia with noted distended bladder -> foley with good UOP.   ? ?Assessment/ Plan:   ?Resolving Severe AKI, was dialysis dependent:   ?Now stable GFR, adequate UOP.   ?Has Trialysis IJ, can remove.   ?Cont to hold entresto. S ?trict I/Os.  ?CRRT was 4/20-22, no HD since ?Had to start CRRT 4/20-4/22   ?Hyperkalemia, severe: secondary to #1. Resolved ?AMS: suspect multifactorial with h/o CVA, severe AKI, gabapentin - hold.  further w/u per primary; seems tob e improving ?HFrEF: 2019 TTE with EF 20-25% with grade 2 DD.   ?H/o CVA, L hemiparesis, report of h/o aphasia: hold gabapentin with AKI ?A fib: on amiodarone ? ?Will sign off for now.  Please call with any questions or concerns.  Pt does not need follow up with nephrology and PCP can follow renal function, refer as needed to our office   ? ?Subjective:   ?Awake and alert, answers questions appropriately ?SCr stable 2.26, K 3.3 ?0.9L UOP ?Not much appetite ?  ? ?Objective:   ?BP (!) 127/55   Pulse 61   Temp (!) 97 ?F (36.1 ?C)   Resp 14   Ht '5\' 11"'$  (1.803 m)   Wt 77.7 kg   SpO2 98%   BMI 23.89 kg/m?  ? ?Intake/Output Summary (Last 24 hours) at 05/03/2021 0845 ?Last data filed at 05/03/2021 0600 ?Gross per 24 hour  ?Intake 319.95 ml  ?Output 935 ml  ?Net -615.05 ml  ? ? ?Weight change: -2.5 kg ? ?Physical Exam: ?Gen: NAD, chronically ill appearing ?Eyes: anicteric ?CV: RRR, no rub ?Abd: soft, nontender, nondistended ?Lungs: normal WOB, clear ?GU: foley draining dark  urine ?Extr:  L arm and leg hemiparesis with mild dependent edema, no R edema ?Neuro:  dysarthric but able to answer simple questions ?Skin: cool and dry ? ?Imaging: ?DG Abd 1 View ? ?Result Date: 05/02/2021 ?CLINICAL DATA:  Abdominal pain, shortness of breath, history of CVA. EXAM: ABDOMEN - 1 VIEW COMPARISON:  None. FINDINGS: There are multiple  dilated small bowel loops in midabdomen measuring up to 4.4 cm concerning for ileus/obstruction. IVC filter is noted. Bilateral iliac vascular stents. Partially imaged left hip cephalomedullary nail. IMPRESSION: Multiple dilated small bowel loops in mid abdomen measuring up to 4.3 cm concerning for ileus/obstruction. Electronically Signed   By: Keane Police D.O.   On: 05/02/2021 16:19  ? ?DG CHEST PORT 1 VIEW ? ?Result Date: 05/02/2021 ?CLINICAL DATA:  Shortness of breath EXAM: PORTABLE CHEST 1 VIEW COMPARISON:  04/30/2021 FINDINGS: No significant change in AP portable chest radiograph. Cardiomegaly with right neck multi lumen vascular catheter. Mild, diffuse bilateral interstitial pulmonary opacity. Right subclavian vascular stent. IMPRESSION: Cardiomegaly with unchanged mild, diffuse bilateral interstitial pulmonary opacity, likely edema. No new or focal airspace opacity. Electronically Signed   By: Delanna Ahmadi M.D.   On: 05/02/2021 16:18   ? ?Labs: ?BMET ?Recent Labs  ?Lab 04/30/21 ?0200 04/30/21 ?0757 04/30/21 ?1608 04/30/21 ?1933 05/01/21 ?0214 05/01/21 ?1516 05/02/21 ?0301 05/02/21 ?1630 05/03/21 ?0358  ?NA 139   < > 138 138 137 137 137 139 140  ?K 3.9   < > 3.9 3.8 3.8 3.8 3.9 3.5 3.3*  ?CL 103   < > 104 106 105 104 105 103 107  ?CO2 29   < > '27 26 27 27 26 28 28  '$ ?  GLUCOSE 173*   < > 131* 134* 143* 103* 106* 85 83  ?BUN 48*   < > '22 20 14 11 12 14 14  '$ ?CREATININE 5.91*   < > 3.19* 2.74* 2.12* 1.89* 2.10* 2.23* 2.26*  ?CALCIUM 8.8*   < > 8.5* 8.4* 8.6* 9.0 9.7 9.7 9.6  ?PHOS 3.4  --  2.4*  --  1.7* 2.2* 2.8 2.8 2.8  ? < > = values in this interval not displayed.  ? ? ?CBC ?Recent Labs  ?Lab 04/30/21 ?0200 05/01/21 ?0210 05/02/21 ?0301 05/03/21 ?7209  ?WBC 10.1 11.5* 10.2 6.8  ?HGB 12.5* 12.5* 12.3* 11.0*  ?HCT 36.6* 38.2* 38.5* 33.4*  ?MCV 79.2* 80.8 81.7 80.1  ?PLT 335 306 312 285  ? ? ? ?Medications:   ? ? B-complex with vitamin C  1 tablet Oral Daily  ? chlorhexidine  15 mL Mouth Rinse BID  ? Chlorhexidine  Gluconate Cloth  6 each Topical Daily  ? famotidine  20 mg Oral Daily  ? gabapentin  100 mg Oral BID  ? heparin  5,000 Units Subcutaneous Q8H  ? lactose free nutrition  237 mL Oral TID BM  ? mouth rinse  15 mL Mouth Rinse q12n4p  ? midodrine  5 mg Oral TID WC  ? prochlorperazine  5 mg Intravenous Once  ? sodium chloride flush  10-40 mL Intracatheter Q12H  ? ? ?Rexene Agent, MD  ?05/03/2021, 8:45 AM  ? ?

## 2021-05-03 NOTE — Progress Notes (Signed)
?                                                   ?  Palliative Care Progress Note, Assessment & Plan  ? ?Patient Name: [name] [name]       Date: 05/03/2021 ?DOB: Dec 07, 1945  Age: 76 y.o. MRN#: [id number] ?Attending Physician: Laurin Coder, MD ?Primary Care Physician: Patient, No Pcp Per (Inactive) ?Admit Date: 04/29/2021 ? ?Reason for Consultation/Follow-up: Establishing goals of care ? ?Subjective: ?Patient is sitting in bed in no apparent distress.  He awakens as I enter the room.  He is able to make his wishes known.  No family at bedside presently. ? ?HPI: ?76 y.o. male  with past medical history of HTN, HFrEF (echo 04/29/2021 with an EF of 20 to 25% and severely decreased function of the left ventricle), A-fib, CVA with left-sided hemiplegia and aphasia, PVD, and type 2 diabetes admitted on 04/29/2021 with altered mental status x2 days from skilled nursing facility. ?  ?On admission patient was hyper kalemia with an elevated creatinine of 11.42 as well as transaminitis.  Nephrology was consulted in the setting of severe hyperkalemia and ARF. CRRT was initiated 4/21 and given improvement in K and UOP, CRRT was discontinued 4/24.  ? ?Summary of counseling/coordination of care: ?After reviewing the patient's chart, assessed patient at bedside.  He did not recall meeting me or discussing advance care planning with me last week.  He shares that he thinks his son would now be his decision maker since he is a physician.  ? ?Reviewed that patient was no longer receiving CRRT.  Patient did not know what I was talking about.  We reviewed that this was the dialysis machine that was temporary placed for him to have dialysis at bedside.  Patient does not recall having CRRT. No family at bedside. I will attempt to speak with family at a later date/time. ? ?PMT will continue to follow  the patient throughout his hospitalization.  ? ?Verdell Carmine. Temitayo Covalt, DNP, FNP-BC ?Palliative Medicine Team ?Team Phone # [phone] ? ?NO CHARGE ?  ?

## 2021-05-04 DIAGNOSIS — E43 Unspecified severe protein-calorie malnutrition: Secondary | ICD-10-CM | POA: Diagnosis not present

## 2021-05-04 DIAGNOSIS — E875 Hyperkalemia: Secondary | ICD-10-CM | POA: Diagnosis not present

## 2021-05-04 DIAGNOSIS — N179 Acute kidney failure, unspecified: Secondary | ICD-10-CM | POA: Diagnosis not present

## 2021-05-04 DIAGNOSIS — R4182 Altered mental status, unspecified: Secondary | ICD-10-CM | POA: Diagnosis not present

## 2021-05-04 DIAGNOSIS — N19 Unspecified kidney failure: Secondary | ICD-10-CM

## 2021-05-04 LAB — RENAL FUNCTION PANEL
Albumin: 1.9 g/dL — ABNORMAL LOW (ref 3.5–5.0)
Albumin: 2 g/dL — ABNORMAL LOW (ref 3.5–5.0)
Anion gap: 4 — ABNORMAL LOW (ref 5–15)
Anion gap: 9 (ref 5–15)
BUN: 14 mg/dL (ref 8–23)
BUN: 15 mg/dL (ref 8–23)
CO2: 28 mmol/L (ref 22–32)
CO2: 25 mmol/L (ref 22–32)
Calcium: 9.4 mg/dL (ref 8.9–10.3)
Calcium: 9.3 mg/dL (ref 8.9–10.3)
Chloride: 105 mmol/L (ref 98–111)
Chloride: 101 mmol/L (ref 98–111)
Creatinine, Ser: 2.39 mg/dL — ABNORMAL HIGH (ref 0.61–1.24)
Creatinine, Ser: 2.26 mg/dL — ABNORMAL HIGH (ref 0.61–1.24)
GFR, Estimated: 28 mL/min — ABNORMAL LOW (ref >60)
GFR, Estimated: 30 mL/min — ABNORMAL LOW
Glucose, Bld: 157 mg/dL — ABNORMAL HIGH (ref 70–99)
Glucose, Bld: 137 mg/dL — ABNORMAL HIGH (ref 70–99)
Phosphorus: 2.2 mg/dL — ABNORMAL LOW (ref 2.5–4.6)
Phosphorus: 2 mg/dL — ABNORMAL LOW (ref 2.5–4.6)
Potassium: 3.1 mmol/L — ABNORMAL LOW (ref 3.5–5.1)
Potassium: 3.1 mmol/L — ABNORMAL LOW (ref 3.5–5.1)
Sodium: 137 mmol/L (ref 135–145)
Sodium: 135 mmol/L (ref 135–145)

## 2021-05-04 LAB — MAGNESIUM: Magnesium: 2.3 mg/dL (ref 1.7–2.4)

## 2021-05-04 LAB — CULTURE, BLOOD (ROUTINE X 2)
Culture: NO GROWTH
Culture: NO GROWTH
Special Requests: ADEQUATE

## 2021-05-04 LAB — APTT: aPTT: 60 seconds — ABNORMAL HIGH (ref 24–36)

## 2021-05-04 LAB — GLUCOSE, CAPILLARY
Glucose-Capillary: 143 mg/dL — ABNORMAL HIGH (ref 70–99)
Glucose-Capillary: 94 mg/dL (ref 70–99)
Glucose-Capillary: 123 mg/dL — ABNORMAL HIGH (ref 70–99)
Glucose-Capillary: 141 mg/dL — ABNORMAL HIGH (ref 70–99)
Glucose-Capillary: 147 mg/dL — ABNORMAL HIGH (ref 70–99)
Glucose-Capillary: 184 mg/dL — ABNORMAL HIGH (ref 70–99)

## 2021-05-04 MED ORDER — MUPIROCIN 2 % EX OINT
1.0000 "application " | TOPICAL_OINTMENT | Freq: Two times a day (BID) | CUTANEOUS | Status: DC
  Administered 2021-05-04 – 2021-05-07 (×7): 1 via NASAL
  Filled 2021-05-04: qty 22

## 2021-05-04 MED ORDER — CHLORHEXIDINE GLUCONATE CLOTH 2 % EX PADS
6.0000 | MEDICATED_PAD | Freq: Every day | CUTANEOUS | Status: DC
  Administered 2021-05-05 – 2021-05-07 (×3): 6 via TOPICAL

## 2021-05-04 MED ORDER — TAMSULOSIN HCL 0.4 MG PO CAPS
0.4000 mg | ORAL_CAPSULE | Freq: Every day | ORAL | Status: DC
Start: 2021-05-04 — End: 2021-05-07
  Administered 2021-05-04 – 2021-05-07 (×4): 0.4 mg via ORAL
  Filled 2021-05-04 (×4): qty 1

## 2021-05-04 MED ORDER — POTASSIUM PHOSPHATES 15 MMOLE/5ML IV SOLN
15.0000 mmol | Freq: Once | INTRAVENOUS | Status: AC
  Administered 2021-05-04: 15 mmol via INTRAVENOUS
  Filled 2021-05-04: qty 5

## 2021-05-04 MED ORDER — ADULT MULTIVITAMIN W/MINERALS CH
1.0000 | ORAL_TABLET | Freq: Every day | ORAL | Status: DC
  Administered 2021-05-04 – 2021-05-07 (×4): 1 via ORAL
  Filled 2021-05-04 (×4): qty 1

## 2021-05-04 NOTE — Progress Notes (Addendum)
?PROGRESS NOTE ? ? ? ?[name] [name]  BTD:[id number] DOB: 1945/08/16 DOA: 04/29/2021 ?PCP: Patient, No Pcp Per (Inactive)  ? 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 ?Potassium was above 7.5 on admission and creatinine was 11.42.  Creatinine at baseline is between 1.3 and 1.5.  He was also found to have transaminitis which was thought to be related to amiodarone and so this was held. ?He was treated with CRRT with improvement in his renal functions and urine output and electrolytes.  Delene Loll was on hold. ?CT abdomen pelvis and renal ultrasound consistent with bladder outlet obstruction and distended gallbladder and bilateral hydroureteronephrosis. ?The catheter was taken out 05/03/2021 currently he has a condom cath. ? ?Assessment & Plan: ?  ?Principal Problem: ?  Acute renal failure (ARF) ([place]) ?Active Problems: ?  Protein-calorie malnutrition, severe ? ?#1AKI due to BOO-in the setting of Lasix, Entresto ?patient presented with change in mental status bladder distention hyperkalemia and elevated creatinine.  He received CRRT with improvement in his renal functions and electrolytes.  Patient was followed by renal signed off 05/03/2021.  PCCM signed off 05/04/2021. ?Delene Loll was stopped ?Gabapentin dose was decreased ?He was on gabapentin 400 mg twice a day prior to admission ? ?#2 transaminitis was thought to be due to amiodarone with improvement in liver function test. ?Consider restarting Amio and a lower dose in the next few days.   ? ?#3 paroxysmal atrial  fibrillation currently rate controlled off of amiodarone.  Consider restarting amiodarone in the next day or 2. ?Not on anticoagulation ? ?#4 history of stroke with left-sided hemiparesis and dysphagia and dysarthria-continue supportive measures ?Was on aspirin and Lipitor at the nursing home ? ?#5 nausea and vomiting seems to have resolved with Reglan ? ?#6 hyperlipidemia was on Lipitor prior to admission consider restarting in the next day or 2 if liver function remains stable. ? ?#7 combined systolic and diastolic heart failure was on Lasix and Entresto at the nursing home which have not been restarted. ? ?#8 Right pleural effusion status postthoracentesis transudative ? ?#9 BPH start flomax  ? ?#10 hypoglycemia-due to poor oral intake patient has been started on D10 currently his sugars are stable I will change CBG to every 4 and monitor closely.  Dietary consult.  Patient has had tube feeds G-tubes in the past he has a complicated GI history with hiatal hernias surgeries and PEG tubes.  Currently he does not have a PEG tube. ? ?#11 DTI left buttocks, left back and sacral present on admission ? ?#12 hypokalemia replete and recheck labs in a.m. ? ? ? ?Pressure Injury 09/28/19 Elbow Left;Posterior Stage 1 -  Intact skin with non-blanchable redness of a localized area usually over a bony prominence. (Active)  ?09/28/19 1830  ?Location: Elbow  ?Location Orientation: Left;Posterior  ?Staging: Stage 1 -  Intact skin with non-blanchable redness of a localized area usually over a bony prominence.  ?Wound Description (Comments):   ?Present on Admission: Yes  ?   ?Pressure Injury Tibial Distal;Left;Posterior;Lateral Stage 3 -  Full thickness tissue loss. Subcutaneous fat may be visible but bone, tendon or muscle  are NOT exposed. (Active)  ?   ?Location: Tibial  ?Location Orientation: Distal;Left;Posterior;Lateral  ?Staging: Stage 3 -  Full thickness tissue loss. Subcutaneous fat may be visible but bone, tendon or muscle  are NOT exposed.  ?Wound Description (Comments):   ?Present on Admission: Yes  ?   ?Pressure Injury 09/28/19 Tibial Distal;Posterior;Right;Lateral Stage 3 -  Full thickness tissue loss. Subcutaneous fat may be visible but bone, tendon or muscle are NOT exposed. (Active)  ?09/28/19 1832  ?Location: Tibial  ?Location Orientation: Distal;Posterior;Right;Lateral  ?Staging: Stage 3 -  Full thickness tissue loss. Subcutaneous fat may be visible but bone, tendon or muscle are NOT exposed.  ?Wound Description (Comments):   ?Present on Admission:   ?   ?Pressure Injury 09/29/19 Coccyx Mid;Right;Left Stage 1 -  Intact skin with non-blanchable redness of a localized area usually over a bony prominence. Stage 1, with healed ulcers noted (Active)  ?09/29/19 0800  ?Location: Coccyx  ?Location Orientation: Mid;Right;Left  ?Staging: Stage 1 -  Intact skin with non-blanchable redness of a localized area usually over a bony prominence.  ?Wound Description (Comments): Stage 1, with healed ulcers noted  ?Present on Admission: Yes  ?   ?Pressure Injury 04/29/21 Buttocks Left Deep Tissue Pressure Injury - Purple or maroon localized area of discolored intact skin or blood-filled blister due to damage of underlying soft tissue from pressure and/or shear. (Active)  ?04/29/21 1023  ?Location: Buttocks  ?Location Orientation: Left  ?Staging: Deep Tissue Pressure Injury - Purple or maroon localized area of discolored intact skin or blood-filled blister due to damage of underlying soft tissue from pressure and/or shear.  ?Wound Description (Comments):   ?Present on Admission: Yes  ?Dressing Type Foam - Lift dressing to assess site every shift 05/03/21 2035  ?   ?Pressure Injury 04/29/21 Buttocks Right Deep Tissue Pressure Injury - Purple or maroon localized area of discolored intact skin or blood-filled blister due to damage of underlying soft tissue from pressure and/or shear. (Active)  ?04/29/21 1024  ?Location: Buttocks  ?Location  Orientation: Right  ?Staging: Deep Tissue Pressure Injury - Purple or maroon localized area of discolored intact skin or blood-filled blister due to damage of underlying soft tissue from pressure and/or shear.  ?Wound Description (Comments):   ?Present on Admission: Yes  ?Dressing Type Foam - Lift dressing to assess site every shift 05/03/21 2035  ?   ?Pressure Injury 04/29/21 Back Left;Upper Deep Tissue Pressure Injury - Purple or maroon localized area of discolored intact skin or blood-filled blister due to damage of underlying soft tissue from pressure and/or shear. (Active)  ?04/29/21 1025  ?Location: Back  ?Location Orientation: Left;Upper  ?Staging: Deep Tissue Pressure Injury - Purple or maroon localized area of discolored intact skin or blood-filled blister due to damage of underlying soft tissue from pressure and/or shear.  ?Wound Description (Comments):   ?Present on Admission: Yes  ?Dressing Type Foam - Lift dressing to assess site every shift 05/03/21 2035  ?   ?Pressure Injury 04/29/21 Sacrum Deep Tissue Pressure Injury - Purple or maroon localized area of discolored intact skin or blood-filled blister due to damage of underlying soft tissue from pressure and/or shear. (Active)  ?04/29/21 1026  ?Location: Sacrum  ?Location Orientation:   ?Staging: Deep Tissue Pressure Injury - Purple or maroon localized area of discolored intact skin or blood-filled blister due to damage of underlying soft tissue from pressure and/or shear.  ?Wound Description (Comments):   ?Present on Admission: Yes  ?Dressing Type Foam -  Lift dressing to assess site every shift 05/03/21 2035  ? ? ? ? ?Nutrition Problem: Severe Malnutrition ?Etiology: chronic illness (CHF) ? ?Signs/Symptoms: severe muscle depletion, severe fat depletion ? ?Interventions: Boost Plus, MVI ? ?Estimated body mass index is 23.89 kg/m? as calculated from the following: ?  Height as of this encounter: '5\' 11"'$  (1.803 m). ?  Weight as of this encounter: 77.7  kg. ? ?DVT prophylaxis: Heparin  ?code Status: Full code  ?family Communication: Discussed with son Kavan Devan ?Disposition Plan:  Status is: Inpatient ?Remains inpatient appropriate because: AKI CRRT ?  ?Consultants:  ?PC

## 2021-05-04 NOTE — Progress Notes (Signed)
Report called and patient transferred via bed to 3E03.  ?

## 2021-05-04 NOTE — Progress Notes (Signed)
No overnight events ?Stable overnight ? ?PCCM will sign off ?

## 2021-05-04 NOTE — Progress Notes (Signed)
RN bladder scanned pt and he was retaining 274m. RN sent MD secure chat ?

## 2021-05-04 NOTE — Progress Notes (Signed)
?                                                   ?Palliative Care Progress Note, Assessment & Plan  ? ?Patient Name: [name] [name]       Date: 05/04/2021 ?DOB: [birth year]  Age: 76 y.o. MRN#: [id number] ?Attending Physician: Georgette Shell, MD ?Primary Care Physician: Patient, No Pcp Per (Inactive) ?Admit Date: 04/29/2021 ? ?Reason for Consultation/Follow-up: Establishing goals of care ? ?Subjective: ?Patient is sitting up in bed in no apparent distress.  He acknowledges my presence and is able to make his wishes known.  He is alert and oriented x4 today.  No family at bedside.  He has no acute complaints.  He does not recall meeting me in the past, but can recall why he is in the hospital and give some detail of treatments he is receiving.   ? ?HPI: ?76 y.o. male  with past medical history of HTN, HFrEF (echo 04/29/2021 with an EF of 20 to 25% and severely decreased function of the left ventricle), A-fib, CVA with left-sided hemiplegia and aphasia, PVD, and type 2 diabetes admitted on 04/29/2021 with altered mental status x2 days from skilled nursing facility. ?  ?On admission patient was hyper kalemia with an elevated creatinine of 11.42 as well as transaminitis.  Nephrology was consulted in the setting of severe hyperkalemia and ARF. CRRT was initiated 4/21 and given improvement in K and UOP, CRRT was discontinued 4/24. Pt is stable and currently has orders to be transferred to medsurg/medtele when bed available.  ? ?Summary of counseling/coordination of care: ?After reviewing the patient's chart and assessing the patient at bedside, I spoke with patient's daughter Ronette Deter and then son [name] over the phone. ? ?Ronette Deter shares that the patient has lost capacity to make decisions for himself several years ago.  She shares he can be very convincing but that ultimately he  cannot recall necessary facts and make sound decisions for himself.  She shares she is 1 of 5 children and they all communicate and stay updated regarding their father's current health status. She shares they all get along and defer to patient's son [name], who is a medical doctor, to make sound decisions for the patient. She shares that [name] is the person to speak with regarding patient's care. ? ?I spoke with [name] over the phone. We had a brief discussion of patient's current health status and then discussed patient's CODE STATUS.  [name] confirmed that patient's CODE STATUS while outpatient and in his nursing facility is a DNR.  [name] states that patient would like a peaceful passing in his "home" if a cardio pulmonary event were to occur.  However, as he was also clear that patient would like to continue to get treatment and be transferred to the hospital to be able to treat the treatable. ? ?While patient is inpatient, patient is a full code.  [name] says that he needs to make the documentation at the facility more clear so that in the future if patient is transferred to hospital then he will be a full code. ? ?Therapeutic silence and active listening provided for [name] to share his thoughts and emotions regarding patient's current health status.  He shares he feels as though his father is making some improvements and is hopeful he can return to his facility.  He also referenced that they are getting his documentation in order to ensure a clear outline of patient's surrogate decision maker. ? ?Goals remain clear.  Full code and full scope of care while hospitalized.  DNR once patient is discharged from the hospital.  Surrogate decision maker is patient's son [name]. ? ?Plan is for patient to transfer back to long-term care facility. ? ?Questions and concerns were addressed.  Family was encouraged to call with any questions or concerns. ? ?PMT will continue to monitor the patient shadow the patient's chart.  PMT  will intervene at patient/family's request, if goals change, or if patient's health status declines. ? ?Code Status: ?Full code ? ?Prognosis: ?Unable to determine ? ?Discharge Planning: ?SNF with palliative outpatient services ? ?Care plan was discussed with patient, patient's son, patient's daughter ? ?Physical Exam ?Vitals reviewed.  ?Constitutional:   ?   Appearance: He is not ill-appearing or toxic-appearing.  ?HENT:  ?   Head: Normocephalic and atraumatic.  ?   Mouth/Throat:  ?   Mouth: Mucous membranes are moist.  ?Eyes:  ?   Comments: Right eyelid droop  ?Cardiovascular:  ?   Rate and Rhythm: Normal rate.  ?Pulmonary:  ?   Effort: Pulmonary effort is normal.  ?Abdominal:  ?   Palpations: Abdomen is soft.  ?Musculoskeletal:  ?   Comments: bedbound  ?Neurological:  ?   Mental Status: He is alert and oriented to person, place, and time.  ?Psychiatric:     ?   Mood and Affect: Mood normal.     ?   Behavior: Behavior normal.     ?   Thought Content: Thought content normal.     ?   Judgment: Judgment normal.  ?   Comments: Cannot recall previous events, poor historian  ?         ? ?Palliative Assessment/Data: 30% ? ? ? ?Total Time 50 minutes  ?Greater than 50%  of this time was spent counseling and coordinating care related to the above assessment and plan. ? ?Thank you for allowing the Palliative Medicine Team to assist in the care of this patient. ? ?Verdell Carmine. Jazzmine Kleiman, DNP, FNP-BC ?Palliative Medicine Team ?Team Phone # [phone] ?  ?

## 2021-05-04 NOTE — Progress Notes (Addendum)
Nutrition Follow-up ? ?DOCUMENTATION CODES:  ? ?Severe malnutrition in context of chronic illness ? ?INTERVENTION:  ? ?Continue to offer Boost Plus PO TID, each supplement provides 360 kcal and 14 gm protein. ? ?D/C B-complex with vitamin C. Change to MVI with minerals daily. ? ?NUTRITION DIAGNOSIS:  ? ?Severe Malnutrition related to chronic illness (CHF) as evidenced by severe muscle depletion, severe fat depletion. ? ?Ongoing  ? ?GOAL:  ? ?Patient will meet greater than or equal to 90% of their needs ? ?Unmet  ? ?MONITOR:  ? ?PO intake, Supplement acceptance, Labs, Skin ? ?REASON FOR ASSESSMENT:  ? ?Rounds (CRRT, wounds) ?  ? ?ASSESSMENT:  ? ?76 yo male admitted with severe hyperkalemia, acute renal failure. PMH includes HTN, CHF, A fib, CVA with L sided hemiplegia and aphasia, PVD, DM-2. ? ?Discussed patient in ICU rounds and with RN today. ?CRRT off since 4/22.   ? ?Remains on a dysphagia 3 diet with thin liquids. ?Meal intakes: 10-80% ?Patient has been refusing medications and food d/t nausea. ?Reglan added 4/24.  ?Low blood sugars yesterday, so IVF w/ dextrose added. ?He is drinking some of the Boost Plus supplements.  ? ?New stage II PI to ischial tuberosity noted.  ? ?Palliative Care team is following. Plans to meet with family soon.  ? ?Labs reviewed. K 3.1, Phos 2.2 ?CBG: 143-94 ? ?Medications reviewed and include B-complex with vitamin C, Reglan TID, Flomax. ?IVF: D10 at 50 ml/h.  ? ?Admission weight 84.4 kg ?Current weight 77.7 kg ? ?Diet Order:   ?Diet Order   ? ?       ?  DIET DYS 3 Room service appropriate? Yes; Fluid consistency: Thin  Diet effective now       ?  ? ?  ?  ? ?  ? ? ?EDUCATION NEEDS:  ? ?No education needs have been identified at this time ? ?Skin:  Skin Assessment: Skin Integrity Issues: ?Skin Integrity Issues:: Stage II ?DTI: sacrum, back, R & L buttocks ?Stage II: ischial tuberosity ?Other: multiple skin tears ? ?Last BM:  4/24 ? ?Height:  ? ?Ht Readings from Last 1 Encounters:   ?04/29/21 '5\' 11"'$  (1.803 m)  ? ? ?Weight:  ? ?Wt Readings from Last 1 Encounters:  ?05/04/21 77.7 kg  ? ? ? ?BMI:  Body mass index is 23.89 kg/m?. ? ?Estimated Nutritional Needs:  ? ?Kcal:  2300-2500 ? ?Protein:  115-130 gm ? ?Fluid:  2.3-2.5 L ? ? ?Lucas Mallow RD, LDN, CNSC ?Please refer to Amion for contact information.                                                       ? ?

## 2021-05-05 DIAGNOSIS — R7989 Other specified abnormal findings of blood chemistry: Secondary | ICD-10-CM

## 2021-05-05 DIAGNOSIS — N1831 Chronic kidney disease, stage 3a: Secondary | ICD-10-CM

## 2021-05-05 DIAGNOSIS — E875 Hyperkalemia: Secondary | ICD-10-CM

## 2021-05-05 DIAGNOSIS — I69320 Aphasia following cerebral infarction: Secondary | ICD-10-CM

## 2021-05-05 DIAGNOSIS — I480 Paroxysmal atrial fibrillation: Secondary | ICD-10-CM

## 2021-05-05 DIAGNOSIS — R112 Nausea with vomiting, unspecified: Secondary | ICD-10-CM

## 2021-05-05 DIAGNOSIS — I5022 Chronic systolic (congestive) heart failure: Secondary | ICD-10-CM

## 2021-05-05 DIAGNOSIS — N400 Enlarged prostate without lower urinary tract symptoms: Secondary | ICD-10-CM

## 2021-05-05 DIAGNOSIS — N1330 Unspecified hydronephrosis: Secondary | ICD-10-CM

## 2021-05-05 DIAGNOSIS — E785 Hyperlipidemia, unspecified: Secondary | ICD-10-CM

## 2021-05-05 DIAGNOSIS — N320 Bladder-neck obstruction: Secondary | ICD-10-CM

## 2021-05-05 DIAGNOSIS — N179 Acute kidney failure, unspecified: Secondary | ICD-10-CM | POA: Diagnosis not present

## 2021-05-05 DIAGNOSIS — Z9189 Other specified personal risk factors, not elsewhere classified: Secondary | ICD-10-CM | POA: Diagnosis not present

## 2021-05-05 DIAGNOSIS — J90 Pleural effusion, not elsewhere classified: Secondary | ICD-10-CM

## 2021-05-05 LAB — MAGNESIUM: Magnesium: 2 mg/dL (ref 1.7–2.4)

## 2021-05-05 LAB — RENAL FUNCTION PANEL
Albumin: 1.9 g/dL — ABNORMAL LOW (ref 3.5–5.0)
Anion gap: 5 (ref 5–15)
BUN: 15 mg/dL (ref 8–23)
CO2: 27 mmol/L (ref 22–32)
Calcium: 9.2 mg/dL (ref 8.9–10.3)
Chloride: 103 mmol/L (ref 98–111)
Creatinine, Ser: 2.13 mg/dL — ABNORMAL HIGH (ref 0.61–1.24)
GFR, Estimated: 32 mL/min — ABNORMAL LOW (ref >60)
Glucose, Bld: 107 mg/dL — ABNORMAL HIGH (ref 70–99)
Phosphorus: 2.2 mg/dL — ABNORMAL LOW (ref 2.5–4.6)
Potassium: 3.3 mmol/L — ABNORMAL LOW (ref 3.5–5.1)
Sodium: 135 mmol/L (ref 135–145)

## 2021-05-05 LAB — CBC
HCT: 31.3 % — ABNORMAL LOW (ref 39.0–52.0)
Hemoglobin: 10.4 g/dL — ABNORMAL LOW (ref 13.0–17.0)
MCH: 26.5 pg (ref 26.0–34.0)
MCHC: 33.2 g/dL (ref 30.0–36.0)
MCV: 79.8 fL — ABNORMAL LOW (ref 80.0–100.0)
Platelets: 266 10*3/uL (ref 150–400)
RBC: 3.92 MIL/uL — ABNORMAL LOW (ref 4.22–5.81)
RDW: 16.2 % — ABNORMAL HIGH (ref 11.5–15.5)
WBC: 7.3 10*3/uL (ref 4.0–10.5)
nRBC: 0 % (ref 0.0–0.2)

## 2021-05-05 LAB — COMPREHENSIVE METABOLIC PANEL
ALT: 68 U/L — ABNORMAL HIGH (ref 0–44)
AST: 37 U/L (ref 15–41)
Albumin: 1.8 g/dL — ABNORMAL LOW (ref 3.5–5.0)
Alkaline Phosphatase: 72 U/L (ref 38–126)
Anion gap: 5 (ref 5–15)
BUN: 16 mg/dL (ref 8–23)
CO2: 24 mmol/L (ref 22–32)
Calcium: 9 mg/dL (ref 8.9–10.3)
Chloride: 105 mmol/L (ref 98–111)
Creatinine, Ser: 2.18 mg/dL — ABNORMAL HIGH (ref 0.61–1.24)
GFR, Estimated: 31 mL/min — ABNORMAL LOW (ref >60)
Glucose, Bld: 177 mg/dL — ABNORMAL HIGH (ref 70–99)
Potassium: 3.5 mmol/L (ref 3.5–5.1)
Sodium: 134 mmol/L — ABNORMAL LOW (ref 135–145)
Total Bilirubin: 0.6 mg/dL (ref 0.3–1.2)
Total Protein: 4.3 g/dL — ABNORMAL LOW (ref 6.5–8.1)

## 2021-05-05 LAB — GLUCOSE, CAPILLARY
Glucose-Capillary: 181 mg/dL — ABNORMAL HIGH (ref 70–99)
Glucose-Capillary: 133 mg/dL — ABNORMAL HIGH (ref 70–99)
Glucose-Capillary: 107 mg/dL — ABNORMAL HIGH (ref 70–99)
Glucose-Capillary: 105 mg/dL — ABNORMAL HIGH (ref 70–99)

## 2021-05-05 LAB — CHOLESTEROL, BODY FLUID: Cholesterol, Fluid: 18 mg/dL

## 2021-05-05 LAB — PHOSPHORUS: Phosphorus: 2.5 mg/dL (ref 2.5–4.6)

## 2021-05-05 LAB — APTT: aPTT: 56 seconds — ABNORMAL HIGH (ref 24–36)

## 2021-05-05 MED ORDER — AMIODARONE HCL 200 MG PO TABS
200.0000 mg | ORAL_TABLET | Freq: Every day | ORAL | Status: DC
  Administered 2021-05-05 – 2021-05-07 (×3): 200 mg via ORAL
  Filled 2021-05-05 (×3): qty 1

## 2021-05-05 MED ORDER — ATORVASTATIN CALCIUM 40 MG PO TABS
40.0000 mg | ORAL_TABLET | Freq: Every day | ORAL | Status: DC
  Administered 2021-05-05 – 2021-05-07 (×3): 40 mg via ORAL
  Filled 2021-05-05 (×3): qty 1

## 2021-05-05 MED ORDER — ASPIRIN 81 MG PO CHEW
81.0000 mg | CHEWABLE_TABLET | Freq: Every day | ORAL | Status: DC
  Administered 2021-05-05 – 2021-05-07 (×3): 81 mg via ORAL
  Filled 2021-05-05 (×3): qty 1

## 2021-05-05 NOTE — Progress Notes (Signed)
SLP Cancellation Note ? ?Patient Details ?Name: [name] [name] ?MRN: [id number] ?DOB: Dec 14, 1945 ? ? ?Cancelled treatment:       Reason Eval/Treat Not Completed: Fatigue/lethargy limiting ability to participate. Pt resting quietly as this time. Talked to pts RN, MD and daughter. Pt observed to have occasional coughing with meals, eats too quickly. Daughter also describes this as his baseline. SLP assessed pt last week with similar observations. Will reassess tomorrow during a meal if possible, but there is no urgency; will not disturb patient at this time.  ? ? ?Stanly Si, Katherene Ponto ?05/05/2021, 2:22 PM ?

## 2021-05-05 NOTE — Progress Notes (Signed)
Mobility Specialist Progress Note: ? ? 05/05/21 1104  ?Mobility  ?Activity Turned to left side;Dangled on edge of bed  ?Level of Assistance Maximum assist, patient does 25-49% ?(with 2 people)  ?Assistive Device None  ?Activity Response Tolerated fair  ?$Mobility charge 1 Mobility  ? ?Pt received in bed. RN wanting pt OOB so attempted to sit EOB to work on R leg exercises but pt could not tolerated sitting and needed to lay back down. Pt able to complete 1 set of R leg exercises in bed. Pt declining being hoyer lifted to chair. Left in bed with call bell in reach and all needs met.  ? ?[name] [name] ?Mobility Specialist ?Primary Phone [phone] ? ?

## 2021-05-05 NOTE — Progress Notes (Signed)
?PROGRESS NOTE ? ?[name] [name] HUT:[id number] DOB: 06-18-1945 DOA: 04/29/2021 ?PCP: Patient, No Pcp Per (Inactive) ? ? LOS: 6 days  ? 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  He was initially admitted to the ICU, nephrology was consulted and underwent CRRT.  He had a Foley catheter placed as well. ? ?Subjective / 24h Interval events: ?Eating breakfast this morning, doing well.  Voices no concerns.  No chest pain, no shortness of breath.  No abdominal pain, no nausea or vomiting ? ?Assesement and Plan: ?Principal Problem: ?  AKI (acute kidney injury) ([name]) ?Active Problems: ?  CVA, old, aphasia ?  Chronic systolic CHF (congestive heart failure) ([name]) ?  AF (paroxysmal atrial fibrillation) ([name]) ?  At risk for aspiration ?  Protein-calorie malnutrition, severe ?  HLD (hyperlipidemia) ?  Pleural effusion, right ?  Nausea and vomiting ?  Elevated LFTs ?  BPH (benign prostatic hyperplasia) ?  Hyperkalemia ?  Stage 3a chronic kidney disease (CKD) ([name]) ?  Bladder outlet obstruction ?  Bilateral hydronephrosis ? ?Principal problem ?Acute kidney injury on chronic kidney disease stage IIIa, due to bladder outlet obstruction-patient's baseline creatinine between 1 and 1.4, came into the hospital with acute kidney injury and a creatinine of 11.4.  Nephrology consulted, and given degree of renal failure he was admitted to the ICU and placed on CRRT.  A Foley catheter was also placed.  Creatinine gradually improving, currently at 2.18. ? ?Active problems ?Bladder outlet obstruction-likely in the setting of BPH, he was started on tamsulosin.  Foley catheter was removed 4/25 and he was given a voiding trial, however he failed and Foley catheter had to be reinserted on  4/26.  Continue for now, he will need to remain with Foley for the next couple of weeks and have urology outpatient follow-up. ? ?Paroxysmal A-fib-he is on amiodarone but not on anticoagulation.  Resume amiodarone as LFTs are stable ? ?Chronic combined CHF-a 2D echo done April 2023 shows an EF of 20-25%, similar to his EF in 2019.  Currently appears euvolemic.  At home he is on Entresto which needs to be held now due to his renal failure, he is also on furosemide, hold for now. ? ?Large right-sided pleural effusion-underwent thoracentesis per PCCM, effusion was transudative ? ?Elevated LFTs-initial concern that these were due to amiodarone.  This was stopped, LFTs are stable and resume amiodarone today ? ?Hyperkalemia, hypokalemia-on admission potassium was undetectably high above 7.5.  Underwent dialysis and his AKI was treated, now potassium has normalized/hypokalemic at times.  Continue to monitor and replete as indicated ? ?Concern for possible pneumonia-ruled out, no fever, no leukocytosis, Rocephin discontinued ? ?Nausea, vomiting-likely in the setting of renal failure, now resolved. ? ?Acute metabolic encephalopathy-likely in the setting of renal failure, mental status improved ? ?History of CVA with residual left-sided hemiparesis, dysphagia, dysarthria-continue supportive measures, resume aspirin as well as Lipitor ?   ?Hypoglycemia-due to poor p.o. intake.  Patient is eating well this morning.  CBGs 130s-180s.  I will stop dextrose and closely monitor ? ?Goals of care-palliative care following as an outpatient, consulted here as well.  Ongoing. ? ?Scheduled Meds: ? chlorhexidine  15 mL Mouth Rinse BID  ? Chlorhexidine Gluconate Cloth  6 each Topical Q0600  ? famotidine  20  mg Oral Daily  ? gabapentin  100 mg Oral BID  ? heparin  5,000 Units Subcutaneous Q8H  ? lactose free nutrition  237 mL Oral TID BM  ? mouth rinse  15 mL Mouth Rinse q12n4p  ? metoCLOPramide (REGLAN) injection  5 mg Intravenous TID AC &  HS  ? multivitamin with minerals  1 tablet Oral Daily  ? mupirocin ointment  1 application. Nasal BID  ? tamsulosin  0.4 mg Oral Daily  ? ?Continuous Infusions: ? sodium chloride Stopped (05/03/21 1501)  ? dextrose 50 mL/hr at 05/04/21 2332  ? ?PRN Meds:.docusate sodium, ondansetron (ZOFRAN) IV, polyethylene glycol ? ?Diet Orders (From admission, onward)  ? ?  Start     Ordered  ? 04/29/21 1017  DIET DYS 3 Room service appropriate? Yes; Fluid consistency: Thin  Diet effective now       ?Question Answer Comment  ?Room service appropriate? Yes   ?Fluid consistency: Thin   ?  ? 04/29/21 1016  ? ?  ?  ? ?  ? ? ?DVT prophylaxis: heparin injection 5,000 Units Start: 04/29/21 1200 ?SCDs Start: 04/29/21 0506 ? ? ?Lab Results  ?Component Value Date  ? PLT 266 05/05/2021  ? ? ?  Code Status: Full Code ? ?Family Communication: no family at bedside  ? ?Status is: Inpatient ? ?Remains inpatient appropriate because: Severity of illness ? ?Level of care: Med-Surg ? ?Consultants:  ?PCCM ?Nephrology  ? ?Procedures:  ?2D echo ? ?Microbiology  ?none ? ?Antimicrobials: ?None currently  ? ? ?Objective: ?Vitals:  ? 05/04/21 1758 05/04/21 2003 05/05/21 0030 05/05/21 0617  ?BP: (!) 109/50 (!) 102/53 (!) 100/49 (!) 111/49  ?Pulse: 81 80 75 72  ?Resp: '18 18 18 18  '$ ?Temp: 98.5 ?F (36.9 ?C) 98 ?F (36.7 ?C) 98.1 ?F (36.7 ?C) 97.7 ?F (36.5 ?C)  ?TempSrc: Oral Oral Oral Oral  ?SpO2: 100% 96% 96% 97%  ?Weight:    75.9 kg  ?Height:      ? ? ?Intake/Output Summary (Last 24 hours) at 05/05/2021 1149 ?Last data filed at 05/05/2021 1110 ?Gross per 24 hour  ?Intake 1477.17 ml  ?Output 1475 ml  ?Net 2.17 ml  ? ?Wt Readings from Last 3 Encounters:  ?05/05/21 75.9 kg  ?09/28/19 84.8 kg  ?05/20/17 77.1 kg  ? ? ?Examination: ? ?Constitutional: NAD ?Eyes: no scleral icterus ?ENMT: Mucous membranes are moist.  ?Neck: normal, supple ?Respiratory: clear to auscultation bilaterally, no wheezing, no crackles.  ?Cardiovascular: Regular rate and rhythm, no murmurs /  rubs / gallops. No LE edema.  ?Abdomen: non distended, no tenderness. Bowel sounds positive.  ?Musculoskeletal: no clubbing / cyanosis.  ?Skin: no rashes ?Neurologic: No new focal deficits ? ? ?Data Reviewed: I have independently reviewed following labs and imaging studies  ? ?CBC ?Recent Labs  ?Lab 04/30/21 ?0200 05/01/21 ?0210 05/02/21 ?0301 05/03/21 ?1601 05/05/21 ?0129  ?WBC 10.1 11.5* 10.2 6.8 7.3  ?HGB 12.5* 12.5* 12.3* 11.0* 10.4*  ?HCT 36.6* 38.2* 38.5* 33.4* 31.3*  ?PLT 335 306 312 285 266  ?MCV 79.2* 80.8 81.7 80.1 79.8*  ?MCH 27.1 26.4 26.1 26.4 26.5  ?MCHC 34.2 32.7 31.9 32.9 33.2  ?RDW 15.6* 15.9* 16.2* 16.3* 16.2*  ? ? ?Recent Labs  ?Lab 04/29/21 ?0150 04/29/21 ?0522 04/29/21 ?0755 04/29/21 ?1153 04/29/21 ?1358 04/29/21 ?1654 04/29/21 ?1812 04/29/21 ?2005 05/01/21 ?0210 05/01/21 ?0214 05/02/21 ?0301 05/02/21 ?1000 05/02/21 ?1630 05/03/21 ?0932 05/04/21 ?3557 05/04/21 ?1607 05/05/21 ?0129  ?NA 134*  --    < > 140  --   --   --    < >  --    < >  137  --  139 140 137 135 134*  ?K >7.5*  --    < > 7.2*  --   --   --    < >  --    < > 3.9  --  3.5 3.3* 3.1* 3.1* 3.5  ?CL 99  --    < > 104  --   --   --    < >  --    < > 105  --  103 107 105 101 105  ?CO2 25  --    < > 25  --   --   --    < >  --    < > 26  --  '28 28 28 25 24  '$ ?GLUCOSE 96  --    < > 119*  121*  --   --   --    < >  --    < > 106*  --  85 83 157* 137* 177*  ?BUN 92*  --    < > 87*  --   --   --    < >  --    < > 12  --  '14 14 14 15 16  '$ ?CREATININE 11.42*  --    < > 10.64*  --   --   --    < >  --    < > 2.10*  --  2.23* 2.26* 2.39* 2.26* 2.18*  ?CALCIUM 10.1  --    < > 10.3  --   --   --    < >  --    < > 9.7  --  9.7 9.6 9.4 9.3 9.0  ?AST 128*  --   --   --   --   --   --   --   --   --   --  63*  --   --   --   --  37  ?ALT 164*  --   --   --   --   --   --   --   --   --   --  91*  --   --   --   --  68*  ?ALKPHOS 87  --   --   --   --   --   --   --   --   --   --  61  --   --   --   --  72  ?BILITOT 0.8  --   --   --   --   --   --   --    --   --   --  0.7  --   --   --   --  0.6  ?ALBUMIN 2.5*  --   --   --   --   --   --    < >  --    < > 2.0* 1.9* 1.8* 1.9* 1.9* 2.0* 1.8*  ?MG  --   --   --   --   --   --   --    < > 2.5*  --  2.4

## 2021-05-05 NOTE — Evaluation (Signed)
Physical Therapy Evaluation ?Patient Details ?Name: [name] [name] ?MRN: [id number] ?DOB: [birth year] ?Today's Date: 05/05/2021 ? ?History of Present Illness ? Leelyn is a 76 y.o. male presenting to [facility]/29/[facility] from nursing facility with AMS. Labs (+) for acute kidney injury. PMH includes R CVA with residual L sided hemiparesis and dysarthria, A-fib, HTN, PVD, and CHF.  ?Clinical Impression ? Patient presented to [facility] 04/29/21 with AMS consistent with patient's diagnosis of acute kidney injury. Pt presents at his baseline level of functioning. Patient requires total Ax2 with bed mobility. He states nursing staff at [facility] facility use lift equipment to transfer him for linen changes and sponge baths. Given pt at his baseline, no further skilled PT needs at this time. Will defer further needs to LTC facility. Acute PT will sign off. If changes occur, please reconsult.  ?   ?   ? ?Recommendations for follow up therapy are one component of a multi-disciplinary discharge planning process, led by the attending physician.  Recommendations may be updated based on patient status, additional functional criteria and insurance authorization. ? ?Follow Up Recommendations Long-term institutional care without follow-up therapy ? ?  ?Assistance Recommended at Discharge Frequent or constant Supervision/Assistance  ?Patient can return home with the following ? Two people to help with walking and/or transfers;Two people to help with bathing/dressing/bathroom;Assistance with cooking/housework;Assistance with feeding;Direct supervision/assist for medications management;Direct supervision/assist for financial management;Assist for transportation;Help with stairs or ramp for entrance ? ?  ?Equipment Recommendations None recommended by PT  ?Recommendations for Other Services ?    ?  ?Functional Status Assessment Patient has not had a recent decline in their functional status  ? ?  ?Precautions / Restrictions Precautions ?Precautions:  Fall ?Restrictions ?Weight Bearing Restrictions: No ?Other Position/Activity Restrictions: prevalon boots bilaterally for pressure injury prevention  ? ?  ? ?Mobility ? Bed Mobility ?Overal bed mobility: Needs Assistance ?Bed Mobility: Sit to Supine, Sit to Sidelying ?  ?  ?  ?Sit to supine: Total assist, +2 for physical assistance, +2 for safety/equipment ?Sit to sidelying: Total assist, +2 for physical assistance, +2 for safety/equipment ?General bed mobility comments: pt was total Ax2 for trunk and LE management with bed mobility tasks. Pt with no trunk or postural control in sitting. Pt able to dangle on EOB, however, reports pain in his legs and hips. Further mobility deferred for pt and therapist safety ?  ? ?Transfers ?  ?  ?  ?  ?  ?  ?  ?  ?  ?  ?  ? ?Ambulation/Gait ?  ?  ?  ?  ?  ?  ?  ?  ? ?Stairs ?  ?  ?  ?  ?  ? ?Wheelchair Mobility ?  ? ?Modified Rankin (Stroke Patients Only) ?  ? ?  ? ?Balance Overall balance assessment: Needs assistance ?Sitting-balance support: No upper extremity supported, Feet unsupported ?Sitting balance-Leahy Scale: Zero ?Sitting balance - Comments: pt required total Ax2 to dangle on EOB in sitting position. ?Postural control: Posterior lean ?  ?  ?  ?  ?  ?  ?  ?  ?  ?  ?  ?  ?  ?  ?   ? ? ? ?Pertinent Vitals/Pain Pain Assessment ?Pain Assessment: Faces ?Faces Pain Scale: Hurts little more ?Pain Location: L leg with mobility tasks ?Pain Descriptors / Indicators: Discomfort, Grimacing ?Pain Intervention(s): Limited activity within patient's tolerance  ? ? ?Home Living Family/patient expects to be discharged to:: Skilled nursing facility ?  ?  ?  ?  ?  ?  ?  ?  ?  ?  Additional Comments: LTC resident; pt expects to return here upon D/C.  ?  ?Prior Function Prior Level of Function : Needs assist ?  ?  ?  ?Physical Assist : Mobility (physical) ?Mobility (physical): Bed mobility ?  ?Mobility Comments: pt confirms he is mostly bed bound. Reports nurses at [facility] facility use lift  equipment to transfer pt to chair for sponge bath and sheet changes. ?ADLs Comments: nursing staff helps with all ADLs and iADLs ?  ? ? ?Hand Dominance  ? Dominant Hand: Right ? ?  ?Extremity/Trunk Assessment  ? Upper Extremity Assessment ?Upper Extremity Assessment: Defer to OT evaluation ?  ? ?Lower Extremity Assessment ?Lower Extremity Assessment: RLE deficits/detail;LLE deficits/detail ?RLE Deficits / Details: pt with limited ROM in R hip, knee, and ankle. AROM is similar to his PROM. Joint restrictions and contractures present from long term bed bound history. ?RLE Coordination: decreased gross motor ?LLE Deficits / Details: pt with limited ROM in R hip, knee, and ankle. This is pt's hemiplegic side with no AROM present. Pt at rest in excessive hip ER with limited IR present. Large left knee flexion contracture. Foot and ankle with increased swelling compared to R and pes planus. Joint restrictions and contractures present from long term bed bound history. Pt reports this is his baseline. ?  ? ?Cervical / Trunk Assessment ?Cervical / Trunk Assessment: Normal  ?Communication  ? Communication: Expressive difficulties  ?Cognition Arousal/Alertness: Awake/alert ?Behavior During Therapy: Flat affect ?Overall Cognitive Status: No family/caregiver present to determine baseline cognitive functioning ?  ?  ?  ?  ?  ?  ?  ?  ?  ?  ?  ?  ?  ?  ?  ?  ?General Comments: pt with flat affect and slower processing with tasks performed ?  ?  ? ?  ?General Comments General comments (skin integrity, edema, etc.): superficial wounds noted on anterior L knee. ? ?  ?Exercises    ? ?Assessment/Plan  ?  ?PT Assessment Patient does not need any further PT services  ?PT Problem List   ? ?   ?  ?PT Treatment Interventions     ? ?PT Goals (Current goals can be found in the Care Plan section)  ?Acute Rehab PT Goals ?Patient Stated Goal: to return to LTC facility ?PT Goal Formulation: With patient ?Time For Goal Achievement:  05/05/21 ?Potential to Achieve Goals: Good ? ?  ?Frequency   ?  ? ? ?Co-evaluation   ?  ?  ?  ?  ? ? ?  ?AM-PAC PT "6 Clicks" Mobility  ?Outcome Measure Help needed turning from your back to your side while in a flat bed without using bedrails?: Total ?Help needed moving from lying on your back to sitting on the side of a flat bed without using bedrails?: Total ?Help needed moving to and from a bed to a chair (including a wheelchair)?: Total ?Help needed standing up from a chair using your arms (e.g., wheelchair or bedside chair)?: Total ?Help needed to walk in hospital room?: Total ?Help needed climbing 3-5 steps with a railing? : Total ?6 Click Score: 6 ? ?  ?End of Session   ?Activity Tolerance: Patient tolerated treatment well ?Patient left: in bed;with call bell/phone within reach ?Nurse Communication: Mobility status ?PT Visit Diagnosis: Other abnormalities of gait and mobility (R26.89);Muscle weakness (generalized) (M62.81) ?  ? ?Time: 6283-6629 ?PT Time Calculation (min) (ACUTE ONLY): 17 min ? ? ?Charges:   PT Evaluation ?$PT Eval Moderate Complexity: 1  Mod ?  ?  ?   ? ? ?Jonne Ply, SPT ? ?Jonne Ply ?05/05/2021, 4:11 PM ? ?

## 2021-05-06 DIAGNOSIS — N1330 Unspecified hydronephrosis: Secondary | ICD-10-CM | POA: Diagnosis not present

## 2021-05-06 DIAGNOSIS — Z9189 Other specified personal risk factors, not elsewhere classified: Secondary | ICD-10-CM | POA: Diagnosis not present

## 2021-05-06 DIAGNOSIS — N179 Acute kidney failure, unspecified: Secondary | ICD-10-CM | POA: Diagnosis not present

## 2021-05-06 DIAGNOSIS — I480 Paroxysmal atrial fibrillation: Secondary | ICD-10-CM | POA: Diagnosis not present

## 2021-05-06 LAB — CBC
HCT: 33.9 % — ABNORMAL LOW (ref 39.0–52.0)
Hemoglobin: 11 g/dL — ABNORMAL LOW (ref 13.0–17.0)
MCH: 25.9 pg — ABNORMAL LOW (ref 26.0–34.0)
MCHC: 32.4 g/dL (ref 30.0–36.0)
MCV: 79.8 fL — ABNORMAL LOW (ref 80.0–100.0)
Platelets: 300 10*3/uL (ref 150–400)
RBC: 4.25 MIL/uL (ref 4.22–5.81)
RDW: 16.1 % — ABNORMAL HIGH (ref 11.5–15.5)
WBC: 7.8 10*3/uL (ref 4.0–10.5)
nRBC: 0 % (ref 0.0–0.2)

## 2021-05-06 LAB — COMPREHENSIVE METABOLIC PANEL
ALT: 60 U/L — ABNORMAL HIGH (ref 0–44)
AST: 37 U/L (ref 15–41)
Albumin: 1.9 g/dL — ABNORMAL LOW (ref 3.5–5.0)
Alkaline Phosphatase: 91 U/L (ref 38–126)
Anion gap: 6 (ref 5–15)
BUN: 16 mg/dL (ref 8–23)
CO2: 26 mmol/L (ref 22–32)
Calcium: 9.4 mg/dL (ref 8.9–10.3)
Chloride: 104 mmol/L (ref 98–111)
Creatinine, Ser: 2.23 mg/dL — ABNORMAL HIGH (ref 0.61–1.24)
GFR, Estimated: 30 mL/min — ABNORMAL LOW (ref >60)
Glucose, Bld: 115 mg/dL — ABNORMAL HIGH (ref 70–99)
Potassium: 3.6 mmol/L (ref 3.5–5.1)
Sodium: 136 mmol/L (ref 135–145)
Total Bilirubin: 0.5 mg/dL (ref 0.3–1.2)
Total Protein: 5 g/dL — ABNORMAL LOW (ref 6.5–8.1)

## 2021-05-06 LAB — RENAL FUNCTION PANEL
Albumin: 2 g/dL — ABNORMAL LOW (ref 3.5–5.0)
Anion gap: 4 — ABNORMAL LOW (ref 5–15)
BUN: 17 mg/dL (ref 8–23)
CO2: 29 mmol/L (ref 22–32)
Calcium: 9.5 mg/dL (ref 8.9–10.3)
Chloride: 104 mmol/L (ref 98–111)
Creatinine, Ser: 2.2 mg/dL — ABNORMAL HIGH (ref 0.61–1.24)
GFR, Estimated: 30 mL/min — ABNORMAL LOW
Glucose, Bld: 132 mg/dL — ABNORMAL HIGH (ref 70–99)
Phosphorus: 2.1 mg/dL — ABNORMAL LOW (ref 2.5–4.6)
Potassium: 4.1 mmol/L (ref 3.5–5.1)
Sodium: 137 mmol/L (ref 135–145)

## 2021-05-06 LAB — MAGNESIUM: Magnesium: 2 mg/dL (ref 1.7–2.4)

## 2021-05-06 LAB — GLUCOSE, CAPILLARY
Glucose-Capillary: 100 mg/dL — ABNORMAL HIGH (ref 70–99)
Glucose-Capillary: 114 mg/dL — ABNORMAL HIGH (ref 70–99)
Glucose-Capillary: 117 mg/dL — ABNORMAL HIGH (ref 70–99)
Glucose-Capillary: 128 mg/dL — ABNORMAL HIGH (ref 70–99)
Glucose-Capillary: 144 mg/dL — ABNORMAL HIGH (ref 70–99)

## 2021-05-06 LAB — PHOSPHORUS: Phosphorus: 1.8 mg/dL — ABNORMAL LOW (ref 2.5–4.6)

## 2021-05-06 LAB — APTT: aPTT: 30 s (ref 24–36)

## 2021-05-06 MED ORDER — FUROSEMIDE 20 MG PO TABS
20.0000 mg | ORAL_TABLET | Freq: Every day | ORAL | Status: DC
  Administered 2021-05-06 – 2021-05-07 (×2): 20 mg via ORAL
  Filled 2021-05-06 (×2): qty 1

## 2021-05-06 MED ORDER — POTASSIUM CHLORIDE CRYS ER 20 MEQ PO TBCR
40.0000 meq | EXTENDED_RELEASE_TABLET | Freq: Once | ORAL | Status: AC
  Administered 2021-05-06: 40 meq via ORAL
  Filled 2021-05-06: qty 2

## 2021-05-06 NOTE — NC FL2 (Signed)
?[place] MEDICAID FL2 LEVEL OF CARE SCREENING TOOL  ?  ? ?IDENTIFICATION  ?Patient Name: ?[name] [name] Birthdate: [birth year] Sex: male Admission Date (Current Location): ?04/29/2021  ?[place] and [place] Number: ? [place] ?  Facility and Address:  ?[facility] [place] [address] [place], [place] [address] ?     Provider Number: ?[id number]  ?Attending Physician Name and Address:  ?Caren Griffins, MD ? Relative Name and Phone Number:  ?  ?   ?Current Level of Care: ?Hospital Recommended Level of Care: ?[place] Prior Approval Number: ?  ? ?Date Approved/Denied: ?  PASRR Number: ?  ? ?Discharge Plan: ?SNF ?  ? ?Current Diagnoses: ?Patient Active Problem List  ? Diagnosis Date Noted  ? HLD (hyperlipidemia) 05/05/2021  ? Pleural effusion, right 05/05/2021  ? Nausea and vomiting 05/05/2021  ? Elevated LFTs 05/05/2021  ? BPH (benign prostatic hyperplasia) 05/05/2021  ? Hyperkalemia 05/05/2021  ? Stage 3a chronic kidney disease (CKD) ([place]) 05/05/2021  ? Bladder outlet obstruction 05/05/2021  ? Bilateral hydronephrosis 05/05/2021  ? Protein-calorie malnutrition, severe 05/01/2021  ? Acute renal failure (ARF) ([place]) 04/29/2021  ? Pressure injury of skin 09/29/2019  ? SBO (small bowel obstruction) ([place]) 09/28/2019  ? AKI (acute kidney injury) ([place]) 09/28/2019  ? PEG tube malfunction ([place]) 09/28/2019  ? Atrial fibrillation, chronic ([place]) 09/28/2019  ? Hypokalemia 09/28/2019  ? Sepsis ([place])   ? At risk for aspiration   ? Palliative care by specialist   ? Goals of care, counseling/discussion   ? Acute respiratory failure with hypoxia ([place]) 03/22/2017  ? CVA, old, aphasia 03/22/2017  ? Chronic systolic CHF (congestive heart failure) ([place]) 03/22/2017  ? Mild renal insufficiency 03/22/2017  ? AF (paroxysmal atrial fibrillation) ([place]) 03/22/2017  ? ? ?Orientation RESPIRATION BLADDER Height & Weight   ?  ?Self, Time, Place ? Normal Continent Weight: 175 lb 0.7 oz (79.4 kg) ?Height:  '5\' 11"'$   (180.3 cm)  ?BEHAVIORAL SYMPTOMS/MOOD NEUROLOGICAL BOWEL NUTRITION STATUS  ?    Continent Diet  ?AMBULATORY STATUS COMMUNICATION OF NEEDS Skin   ?Extensive Assist Verbally Normal ?  ?  ?  ?    ?     ?     ? ? ?Personal Care Assistance Level of Assistance  ?Bathing, Feeding, Dressing Bathing Assistance: Maximum assistance ?Feeding assistance: Maximum assistance ?Dressing Assistance: Maximum assistance ?   ? ?Functional Limitations Info  ?Sight, Hearing, Speech Sight Info: Adequate ?Hearing Info: Adequate ?Speech Info: Adequate  ? ? ?SPECIAL CARE FACTORS FREQUENCY  ?    ?  ?  ?  ?  ?  ?  ?   ? ? ?Contractures    ? ? ?Additional Factors Info  ?Code Status, Allergies Code Status Info: full ?Allergies Info: NKA ?  ?  ?  ?   ? ?Current Medications (05/06/2021):  This is the current hospital active medication list ?Current Facility-Administered Medications  ?Medication Dose Route Frequency Provider Last Rate Last Admin  ? 0.9 %  sodium chloride infusion  250 mL Intravenous Continuous Julian Hy, DO   Stopped at 05/03/21 1501  ? amiodarone (PACERONE) tablet 200 mg  200 mg Oral Daily Caren Griffins, MD   200 mg at 05/06/21 0934  ? aspirin chewable tablet 81 mg  81 mg Oral Daily Caren Griffins, MD   81 mg at 05/06/21 4481  ? atorvastatin (LIPITOR) tablet 40 mg  40 mg Oral Daily Gherghe, Vella Redhead, MD   40 mg  at 05/06/21 0934  ? chlorhexidine (PERIDEX) 0.12 % solution 15 mL  15 mL Mouth Rinse BID Noemi Chapel P, DO   15 mL at 05/06/21 9518  ? Chlorhexidine Gluconate Cloth 2 % PADS 6 each  6 each Topical Q0600 Georgette Shell, MD   6 each at 05/06/21 [phone]  ? docusate sodium (COLACE) capsule 100 mg  100 mg Oral BID PRN [place] Crane M, PA-C      ? famotidine (PEPCID) tablet 20 mg  20 mg Oral Daily Olalere, Adewale A, MD   20 mg at 05/06/21 0934  ? furosemide (LASIX) tablet 20 mg  20 mg Oral Daily Caren Griffins, MD   20 mg at 05/06/21 1235  ? gabapentin (NEURONTIN) capsule 100 mg  100 mg Oral BID Olalere,  Adewale A, MD   100 mg at 05/06/21 0934  ? heparin injection 5,000 Units  5,000 Units Subcutaneous Q8H Lestine Mount, PA-C   5,000 Units at 05/06/21 0541  ? lactose free nutrition (BOOST PLUS) liquid 237 mL  237 mL Oral TID BM Olalere, Adewale A, MD   237 mL at 05/06/21 0945  ? MEDLINE mouth rinse  15 mL Mouth Rinse q12n4p Julian Hy, DO   15 mL at 05/06/21 1236  ? metoCLOPramide (REGLAN) injection 5 mg  5 mg Intravenous TID AC & HS Pauletta Browns, RPH   5 mg at 05/06/21 1235  ? multivitamin with minerals tablet 1 tablet  1 tablet Oral Daily Georgette Shell, MD   1 tablet at 05/06/21 0944  ? mupirocin ointment (BACTROBAN) 2 % 1 application.  1 application. Nasal BID Georgette Shell, MD   1 application. at 05/06/21 0934  ? ondansetron (ZOFRAN) injection 4 mg  4 mg Intravenous Q8H PRN Olalere, Adewale A, MD      ? polyethylene glycol (MIRALAX / GLYCOLAX) packet 17 g  17 g Oral Daily PRN [place] Crane M, PA-C      ? tamsulosin (FLOMAX) capsule 0.4 mg  0.4 mg Oral Daily Georgette Shell, MD   0.4 mg at 05/06/21 6063  ? ? ? ?Discharge Medications: ?Please see discharge summary for a list of discharge medications. ? ?Relevant Imaging Results: ? ?Relevant Lab Results: ? ? ?Additional Information ?KZ#[id number] ? ?Emeterio Reeve, LCSW ? ? ? ? ?

## 2021-05-06 NOTE — Progress Notes (Signed)
?PROGRESS NOTE ? ?[name] [name] JME:[id number] DOB: July 20, 1945 DOA: 04/29/2021 ?PCP: Patient, No Pcp Per (Inactive) ? ? LOS: 7 days  ? 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  He was initially admitted to the ICU, nephrology was consulted and underwent CRRT.  He had a Foley catheter placed as well. ? ?Subjective / 24h Interval events: ?Watching TV.  Denies any shortness of breath, denies any chest pain.  No abdominal pain, no nausea or vomiting. ? ?Assesement and Plan: ?Principal Problem: ?  AKI (acute kidney injury) ([place]) ?Active Problems: ?  CVA, old, aphasia ?  Chronic systolic CHF (congestive heart failure) ([place]) ?  AF (paroxysmal atrial fibrillation) ([place]) ?  At risk for aspiration ?  Protein-calorie malnutrition, severe ?  HLD (hyperlipidemia) ?  Pleural effusion, right ?  Nausea and vomiting ?  Elevated LFTs ?  BPH (benign prostatic hyperplasia) ?  Hyperkalemia ?  Stage 3a chronic kidney disease (CKD) ([place]) ?  Bladder outlet obstruction ?  Bilateral hydronephrosis ? ?Principal problem ?Acute kidney injury on chronic kidney disease stage IIIa, due to bladder outlet obstruction-patient's baseline creatinine between 1 and 1.4, came into the hospital with acute kidney injury and a creatinine of 11.4.  Nephrology consulted, and given degree of renal failure he was admitted to the ICU and placed on CRRT.  A Foley catheter was also placed.  Creatinine improving, now appears stabilized.  Resume home Lasix given his underlying heart failure, closely monitor creatinine ? ?Active problems ?Bladder outlet obstruction-likely in the setting of BPH, he was started on tamsulosin.  Foley catheter was removed 4/25 and he was given a voiding trial, however he failed  and Foley catheter had to be reinserted on 4/26.  Continue for now, he will need to remain with Foley for the next couple of weeks and have urology outpatient follow-up.  I have contacted urology on-call today regarding outpatient follow-up ? ?Paroxysmal A-fib-he is on amiodarone but not on anticoagulation.  His amiodarone was initially held due to elevated LFTs, it was resumed on 4/26, LFTs are stable today ? ?Chronic combined CHF-a 2D echo done April 2023 shows an EF of 20-25%, similar to his EF in 2019.  Currently appears slightly fluid overloaded.  At home he is on Entresto which needs to be held now due to his renal failure, he is also on furosemide, restart furosemide today ? ?Large right-sided pleural effusion-underwent thoracentesis per PCCM, effusion was transudative.  ? ?Elevated LFTs-initial concern that these were due to amiodarone.  This was stopped, LFTs are stable today, amiodarone resumed on 4/26 ? ?Hyperkalemia, hypokalemia-on admission potassium was undetectably high above 7.5.  Underwent dialysis and his AKI was treated, now potassium has normalized/hypokalemic at times.  Give supplemental potassium again today as Lasix is resumed ? ?Concern for possible pneumonia-ruled out, no fever, no leukocytosis, Rocephin discontinued ? ?Nausea, vomiting-likely in the setting of renal failure, now resolved. ? ?Acute metabolic encephalopathy-likely in the setting of renal failure, mental status improved ? ?History of CVA with residual left-sided hemiparesis, dysphagia, dysarthria-continue supportive measures, resume aspirin as well as Lipitor ?   ?Hypoglycemia-due to poor p.o. intake.  Patient is eating well this morning.  CBGs 130s-180s.  Dextrose discontinued on 4/26 and CBGs appear to remain stable ? ?Goals of care-palliative care  following as an outpatient, consulted here as well.  Ongoing. ? ?Scheduled Meds: ? amiodarone  200 mg Oral Daily  ? aspirin  81 mg Oral Daily  ? atorvastatin  40 mg Oral Daily  ?  chlorhexidine  15 mL Mouth Rinse BID  ? Chlorhexidine Gluconate Cloth  6 each Topical Q0600  ? famotidine  20 mg Oral Daily  ? furosemide  20 mg Oral Daily  ? gabapentin  100 mg Oral BID  ? heparin  5,000 Units Subcutaneous Q8H  ? lactose free nutrition  237 mL Oral TID BM  ? mouth rinse  15 mL Mouth Rinse q12n4p  ? metoCLOPramide (REGLAN) injection  5 mg Intravenous TID AC & HS  ? multivitamin with minerals  1 tablet Oral Daily  ? mupirocin ointment  1 application. Nasal BID  ? potassium chloride  40 mEq Oral Once  ? tamsulosin  0.4 mg Oral Daily  ? ?Continuous Infusions: ? sodium chloride Stopped (05/03/21 1501)  ? ?PRN Meds:.docusate sodium, ondansetron (ZOFRAN) IV, polyethylene glycol ? ?Diet Orders (From admission, onward)  ? ?  Start     Ordered  ? 04/29/21 1017  DIET DYS 3 Room service appropriate? Yes; Fluid consistency: Thin  Diet effective now       ?Question Answer Comment  ?Room service appropriate? Yes   ?Fluid consistency: Thin   ?  ? 04/29/21 1016  ? ?  ?  ? ?  ? ? ?DVT prophylaxis: heparin injection 5,000 Units Start: 04/29/21 1200 ?SCDs Start: 04/29/21 0506 ? ? ?Lab Results  ?Component Value Date  ? PLT 300 05/06/2021  ? ? ?  Code Status: Full Code ? ?Family Communication: no family at bedside, discussed with daughter over the phone ? ?Status is: Inpatient ? ?Remains inpatient appropriate because: Severity of illness ? ?Level of care: Med-Surg ? ?Consultants:  ?PCCM ?Nephrology  ? ?Procedures:  ?2D echo ? ?Microbiology  ?none ? ?Antimicrobials: ?None currently  ? ? ?Objective: ?Vitals:  ? 05/05/21 2122 05/06/21 0420 05/06/21 0500 05/06/21 0942  ?BP: (!) 96/48 (!) 119/49  (!) 104/54  ?Pulse: 88 73  94  ?Resp: '17 14  18  '$ ?Temp: 99 ?F (37.2 ?C) 97.8 ?F (36.6 ?C)  97.8 ?F (36.6 ?C)  ?TempSrc: Oral Oral  Oral  ?SpO2: 97% 98%  97%  ?Weight:   79.4 kg   ?Height:      ? ? ?Intake/Output Summary (Last 24 hours) at 05/06/2021 1012 ?Last data filed at 05/06/2021 0111 ?Gross per 24 hour  ?Intake 400 ml  ?Output  1150 ml  ?Net -750 ml  ? ? ?Wt Readings from Last 3 Encounters:  ?05/06/21 79.4 kg  ?09/28/19 84.8 kg  ?05/20/17 77.1 kg  ? ? ?Examination: ? ?Constitutional: NAD ?Eyes: lids and conjunctivae normal, no scleral icterus ?ENMT: mmm ?Neck: normal, supple ?Respiratory: clear to auscultation bilaterally, no wheezing, no crackles. Normal respiratory effort.  ?Cardiovascular: Regular rate and rhythm, no murmurs / rubs / gallops.  Trace edema upper and lower extremities ?Abdomen: soft, no distention, no tenderness. Bowel sounds positive.  ?Skin: no rashes ?Neurologic: no focal deficits, equal strength ? ? ?Data Reviewed: I have independently reviewed following labs and imaging studies  ? ?CBC ?Recent Labs  ?Lab 05/01/21 ?0210 05/02/21 ?0301 05/03/21 ?8119 05/05/21 ?0129 05/06/21 ?0110  ?WBC 11.5* 10.2 6.8 7.3 7.8  ?HGB 12.5* 12.3* 11.0* 10.4* 11.0*  ?HCT 38.2* 38.5* 33.4* 31.3* 33.9*  ?PLT 306 312 285 266 300  ?MCV 80.8 81.7 80.1 79.8* 79.8*  ?  MCH 26.4 26.1 26.4 26.5 25.9*  ?MCHC 32.7 31.9 32.9 33.2 32.4  ?RDW 15.9* 16.2* 16.3* 16.2* 16.1*  ? ? ? ?Recent Labs  ?Lab 04/29/21 ?1153 04/29/21 ?1358 04/29/21 ?1654 04/29/21 ?1812 04/29/21 ?2005 05/02/21 ?0301 05/02/21 ?1000 05/02/21 ?1630 05/03/21 ?0358 05/04/21 ?0108 05/04/21 ?1607 05/05/21 ?0129 05/05/21 ?1605 05/06/21 ?0110  ?NA 140  --   --   --    < > 137  --    < > 140 137 135 134* 135 136  ?K 7.2*  --   --   --    < > 3.9  --    < > 3.3* 3.1* 3.1* 3.5 3.3* 3.6  ?CL 104  --   --   --    < > 105  --    < > 107 105 101 105 103 104  ?CO2 25  --   --   --    < > 26  --    < > '28 28 25 24 27 26  '$ ?GLUCOSE 119*  121*  --   --   --    < > 106*  --    < > 83 157* 137* 177* 107* 115*  ?BUN 87*  --   --   --    < > 12  --    < > '14 14 15 16 15 16  '$ ?CREATININE 10.64*  --   --   --    < > 2.10*  --    < > 2.26* 2.39* 2.26* 2.18* 2.13* 2.23*  ?CALCIUM 10.3  --   --   --    < > 9.7  --    < > 9.6 9.4 9.3 9.0 9.2 9.4  ?AST  --   --   --   --   --   --  63*  --   --   --   --  37  --  37   ?ALT  --   --   --   --   --   --  91*  --   --   --   --  68*  --  60*  ?ALKPHOS  --   --   --   --   --   --  39  --   --   --   --  72  --  91  ?BILITOT  --   --   --   --   --   --  0.7  --   --

## 2021-05-06 NOTE — Evaluation (Signed)
Clinical/Bedside Swallow Evaluation ?Patient Details  ?Name: [name] [name] ?MRN: [id number] ?Date of Birth: 1945-10-27 ? ?Today's Date: 05/06/2021 ?Time: SLP Start Time (ACUTE ONLY): 0940 SLP Stop Time (ACUTE ONLY): 1000 ?SLP Time Calculation (min) (ACUTE ONLY): 20 min ? ?Past Medical History:  ?Past Medical History:  ?Diagnosis Date  ? Chronic systolic CHF (congestive heart failure) ([place])   ? CVA, old, aphasia   ? Diabetes ([place])   ? Hemiplegia ([place])   ? HTN (hypertension)   ? Intestinal obstruction ([place]) 11/2015  ? Peripheral vascular disease ([place])   ? Pneumonia   ? Stroke [facility])   ? ?Past Surgical History:  ?Past Surgical History:  ?Procedure Laterality Date  ? ABDOMINAL ADHESION SURGERY  11/2015  ? PERIPHERAL VASCULAR CATHETERIZATION    ? R-EIA stent, possibly R-Subclavian stent 2017  ? ?HPI:  ?76 year old man with a history do dysphagia; SLP assessment reordered after Acute Rn again concerned about coughing. Pt initially presented to [facility] ED 4/20 with AMS x 2 days. Per SNF where patient resides, usually A&Ox4. Sent for outpatient labs which were abnormal. Pt has a history of oral dysphagia at baseline though per notes in 2021 pt is able to compensate for oral dysphagia, uses liquid washes and has not required texture modification in prior admissions since recovery from initial CVA. 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  ?  ?Assessment / Plan / Recommendation  ?Clinical Impression ? Pt observed taking sips of thin liquids with pills and in isolation. Pt exhibited immediate, delayed coughing as well as wet vocal quality with all trials. SLP attempted a chin tuck, slower rate and preventative throat clearing as potential strategies. Pt really only willing to attempt a chin tuck, which he subjectively felt improved his swallowing, though delayed coughing was not eliminated. SLP  offered MBS to determine best strategy, pt refused. SLP also briefly discussed thickened liquids which pt recalls and says he will not drink. Given pts conflicting persepctive on care - full code but also not willing to participate in most therapeutic interventions, it may be difficult to avoid intermittnet aspiration. Best courese would be aspiration precautiosn and encouraging coughign and pulmonary hygiene. Will f/u for needs. ?SLP Visit Diagnosis: Dysphagia, oropharyngeal phase (R13.12) ?   ?Aspiration Risk ? Moderate aspiration risk  ?  ?Diet Recommendation Dysphagia 3 (Mech soft);Thin liquid  ? ?Liquid Administration via: Cup;Straw ?Medication Administration: Whole meds with liquid ?Supervision: Staff to assist with self feeding ?Compensations: Minimize environmental distractions;Slow rate;Small sips/bites ?Postural Changes: Seated upright at 90 degrees  ?  ?Other  Recommendations Oral Care Recommendations: Oral care BID   ? ?Recommendations for follow up therapy are one component of a multi-disciplinary discharge planning process, led by the attending physician.  Recommendations may be updated based on patient status, additional functional criteria and insurance authorization. ? ?Follow up Recommendations    ? ? ?  ?Assistance Recommended at Discharge PRN  ?Functional Status Assessment Patient has had a recent decline in their functional status and/or demonstrates limited ability to make significant improvements in function in a reasonable and predictable amount of time  ?Frequency and Duration min 2x/week  ?2 weeks ?  ?   ? ?Prognosis Prognosis for Safe Diet Advancement: Good ?Barriers to Reach Goals: Severity of deficits  ? ?  ? ?Swallow Study   ?General HPI: 76 year old man with a history do dysphagia; SLP assessment reordered after Acute  Rn again concerned about coughing. Pt initially presented to [facility] ED 4/20 with AMS x 2 days. Per SNF where patient resides, usually A&Ox4. Sent for outpatient labs which  were abnormal. Pt has a history of oral dysphagia at baseline though per notes in 2021 pt is able to compensate for oral dysphagia, uses liquid washes and has not required texture modification in prior admissions since recovery from initial CVA. 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 ?Type of Study: Bedside Swallow Evaluation ?Previous Swallow Assessment: BSE 04/29/21 - dys 3 thin ?Diet Prior to this Study: Dysphagia 3 (soft);Thin liquids ?Temperature Spikes Noted: No ?Respiratory Status: Room air ?History of Recent Intubation: No ?Behavior/Cognition: Alert;Cooperative;Pleasant mood ?Oral Cavity Assessment: Within Functional Limits ?Oral Care Completed by SLP: No ?Oral Cavity - Dentition: Adequate natural dentition ?Vision: Functional for self-feeding ?Self-Feeding Abilities: Able to feed self;Needs assist ?Patient Positioning: Upright in bed ?Baseline Vocal Quality: Wet ?Volitional Cough: Strong;Congested ?Volitional Swallow: Able to elicit  ?  ?Oral/Motor/Sensory Function Overall Oral Motor/Sensory Function: Mild impairment ?Facial ROM: Reduced left ?Facial Symmetry: Abnormal symmetry left ?Facial Strength: Within Functional Limits ?Facial Sensation: Within Functional Limits ?Lingual ROM: Within Functional Limits ?Lingual Symmetry: Within Functional Limits ?Lingual Strength: Within Functional Limits   ?Ice Chips     ?Thin Liquid Thin Liquid: Impaired ?Presentation: Straw ?Pharyngeal  Phase Impairments: Cough - Immediate;Cough - Delayed  ?  ?Nectar Thick Nectar Thick Liquid: Not tested ?Presentation: Cup   ?Honey Thick Honey Thick Liquid: Not tested   ?Puree Puree: Not tested   ?Solid ? ? ?  Solid: Not tested  ? ?  ? ?Skylar Priest, Katherene Ponto ?05/06/2021,12:58 PM ? ? ? ?

## 2021-05-06 NOTE — TOC Initial Note (Signed)
Transition of Care (TOC) - Initial/Assessment Note  ? ? ?Patient Details  ?Name: [name] [name] ?MRN: [id number] ?Date of Birth: 1945-11-08 ? ?Transition of Care (TOC) CM/SW Contact:    ?Emeterio Reeve, LCSW ?Phone Number: ?05/06/2021, 1:42 PM ? ?Clinical Narrative:                 ? ?CSW spoke with pts family. Pt is for [place]. Pt is planning on returning at discharge.  ? ? ?Expected Discharge Plan: [place] ?Barriers to Discharge: Continued Medical Work up ? ? ?Patient Goals and CMS Choice ?Patient states their goals for this hospitalization and ongoing recovery are:: return to SNF for LTC ?CMS Medicare.gov Compare Post Acute Care list provided to:: Patient ?Choice offered to / list presented to : Patient ? ?Expected Discharge Plan and Services ?Expected Discharge Plan: [place] ?  ?  ?  ?Living arrangements for the past 2 months: [place] ?                ?  ?  ?  ?  ?  ?  ?  ?  ?  ?  ? ?Prior Living Arrangements/Services ?Living arrangements for the past 2 months: [place] ?Lives with:: Facility Resident ?Patient language and need for interpreter reviewed:: Yes ?Do you feel safe going back to the place where you live?: Yes      ?Need for Family Participation in Patient Care: Yes (Comment) ?Care giver support system in place?: Yes (comment) ?  ?Criminal Activity/Legal Involvement Pertinent to Current Situation/Hospitalization: No - Comment as needed ? ?Activities of Daily Living ?  ?  ? ?Permission Sought/Granted ?Permission sought to share information with : Family Supports, Customer service manager ?Permission granted to share information with : Yes, Verbal Permission Granted ?   ? Permission granted to share info w AGENCY: SNF ?   ?   ? ?Emotional Assessment ?Appearance:: Appears stated age ?Attitude/Demeanor/Rapport: Engaged ?Affect (typically observed): Appropriate ?Orientation: : Oriented to Self, Oriented to Place ?Alcohol / Substance  Use: Not Applicable ?Psych Involvement: No (comment) ? ?Admission diagnosis:  Hyperkalemia [E87.5] ?Urinary retention [R33.9] ?Uremia [N19] ?Acute renal failure (ARF) (HCC) [N17.9] ?Acute renal failure, unspecified acute renal failure type ([place]) [N17.9] ?Altered mental status, unspecified altered mental status type [R41.82] ?Patient Active Problem List  ? Diagnosis Date Noted  ? HLD (hyperlipidemia) 05/05/2021  ? Pleural effusion, right 05/05/2021  ? Nausea and vomiting 05/05/2021  ? Elevated LFTs 05/05/2021  ? BPH (benign prostatic hyperplasia) 05/05/2021  ? Hyperkalemia 05/05/2021  ? Stage 3a chronic kidney disease (CKD) ([place]) 05/05/2021  ? Bladder outlet obstruction 05/05/2021  ? Bilateral hydronephrosis 05/05/2021  ? Protein-calorie malnutrition, severe 05/01/2021  ? Acute renal failure (ARF) ([place]) 04/29/2021  ? Pressure injury of skin 09/29/2019  ? SBO (small bowel obstruction) ([place]) 09/28/2019  ? AKI (acute kidney injury) ([place]) 09/28/2019  ? PEG tube malfunction ([place]) 09/28/2019  ? Atrial fibrillation, chronic ([place]) 09/28/2019  ? Hypokalemia 09/28/2019  ? Sepsis ([place])   ? At risk for aspiration   ? Palliative care by specialist   ? Goals of care, counseling/discussion   ? Acute respiratory failure with hypoxia ([place]) 03/22/2017  ? CVA, old, aphasia 03/22/2017  ? Chronic systolic CHF (congestive heart failure) ([place]) 03/22/2017  ? Mild renal insufficiency 03/22/2017  ? AF (paroxysmal atrial fibrillation) ([place]) 03/22/2017  ? ?PCP:  Patient, No Pcp Per (Inactive) ?Pharmacy:  No Pharmacies Listed ? ? ? ?  Social Determinants of Health (SDOH) Interventions ?  ? ?Readmission Risk Interventions ?   ? View : No data to display.  ?  ?  ?  ? ? ?Emeterio Reeve, LCSW ?Clinical Social Worker ? ?

## 2021-05-07 LAB — COMPREHENSIVE METABOLIC PANEL
ALT: 51 U/L — ABNORMAL HIGH (ref 0–44)
AST: 31 U/L (ref 15–41)
Albumin: 1.9 g/dL — ABNORMAL LOW (ref 3.5–5.0)
Alkaline Phosphatase: 77 U/L (ref 38–126)
Anion gap: 3 — ABNORMAL LOW (ref 5–15)
BUN: 18 mg/dL (ref 8–23)
CO2: 28 mmol/L (ref 22–32)
Calcium: 9.7 mg/dL (ref 8.9–10.3)
Chloride: 106 mmol/L (ref 98–111)
Creatinine, Ser: 2.14 mg/dL — ABNORMAL HIGH (ref 0.61–1.24)
GFR, Estimated: 31 mL/min — ABNORMAL LOW (ref >60)
Glucose, Bld: 124 mg/dL — ABNORMAL HIGH (ref 70–99)
Potassium: 4.3 mmol/L (ref 3.5–5.1)
Sodium: 137 mmol/L (ref 135–145)
Total Bilirubin: 0.5 mg/dL (ref 0.3–1.2)
Total Protein: 5 g/dL — ABNORMAL LOW (ref 6.5–8.1)

## 2021-05-07 LAB — CBC
HCT: 32.1 % — ABNORMAL LOW (ref 39.0–52.0)
Hemoglobin: 10.8 g/dL — ABNORMAL LOW (ref 13.0–17.0)
MCH: 26.8 pg (ref 26.0–34.0)
MCHC: 33.6 g/dL (ref 30.0–36.0)
MCV: 79.7 fL — ABNORMAL LOW (ref 80.0–100.0)
Platelets: 308 10*3/uL (ref 150–400)
RBC: 4.03 MIL/uL — ABNORMAL LOW (ref 4.22–5.81)
RDW: 16.2 % — ABNORMAL HIGH (ref 11.5–15.5)
WBC: 7.9 10*3/uL (ref 4.0–10.5)
nRBC: 0 % (ref 0.0–0.2)

## 2021-05-07 LAB — GLUCOSE, CAPILLARY
Glucose-Capillary: 106 mg/dL — ABNORMAL HIGH (ref 70–99)
Glucose-Capillary: 103 mg/dL — ABNORMAL HIGH (ref 70–99)
Glucose-Capillary: 104 mg/dL — ABNORMAL HIGH (ref 70–99)

## 2021-05-07 LAB — APTT: aPTT: 42 seconds — ABNORMAL HIGH (ref 24–36)

## 2021-05-07 LAB — MAGNESIUM: Magnesium: 1.9 mg/dL (ref 1.7–2.4)

## 2021-05-07 LAB — PHOSPHORUS: Phosphorus: 2 mg/dL — ABNORMAL LOW (ref 2.5–4.6)

## 2021-05-07 MED ORDER — TAMSULOSIN HCL 0.4 MG PO CAPS: 0.4000 mg | ORAL_CAPSULE | Freq: Every day | ORAL | Status: AC

## 2021-05-07 MED ORDER — K PHOS MONO-SOD PHOS DI & MONO 155-852-130 MG PO TABS
250.0000 mg | ORAL_TABLET | Freq: Every day | ORAL | Status: DC
  Administered 2021-05-07: 250 mg via ORAL
  Filled 2021-05-07: qty 1

## 2021-05-07 MED ORDER — GABAPENTIN 100 MG PO CAPS: 400.0000 mg | ORAL_CAPSULE | Freq: Two times a day (BID) | ORAL | Status: AC

## 2021-05-07 NOTE — TOC Transition Note (Addendum)
Transition of Care (TOC) - CM/SW Discharge Note ? ? ?Patient Details  ?Name: [name] [name] ?MRN: [id number] ?Date of Birth: 11/30/1945 ? ?Transition of Care (TOC) CM/SW Contact:  ?Emeterio Reeve, LCSW ?Phone Number: ?05/07/2021, 11:02 AM ? ? ?Clinical Narrative:    ? ?Per MD patient ready for DC to [facility]. RN, patient, patient's family, and facility notified of DC. Discharge Summary and FL2 sent to facility. DC packet on chart. Ambulance transport requested for patient.  ?  ?RN to call report to [phone]. Pt going to room 222. ? ?CSW will sign off for now as social work intervention is no longer needed. Please consult [place] again if new needs arise. ? ? ?Final next level of care: [place] ?Barriers to Discharge: Barriers Resolved ? ? ?Patient Goals and CMS Choice ?Patient states their goals for this hospitalization and ongoing recovery are:: return to SNF for LTC ?CMS Medicare.gov Compare Post Acute Care list provided to:: Patient ?Choice offered to / list presented to : Patient ? ?Discharge Placement ?  ?           ?Patient chooses bed at: [facility] ?Patient to be transferred to facility by: ptar ?Name of family member notified: son, [name] ?Patient and family notified of of transfer: 05/07/21 ? ?Discharge Plan and Services ?  ?  ?           ?  ?  ?  ?  ?  ?  ?  ?  ?  ?  ? ?Social Determinants of Health (SDOH) Interventions ?  ? ? ?Readmission Risk Interventions ?   ? View : No data to display.  ?  ?  ?  ? ? ?Emeterio Reeve, LCSW ?Clinical Social Worker ? ? ? ?

## 2021-05-07 NOTE — Discharge Summary (Addendum)
? ?Physician Discharge Summary  ?[name] [name] IWL:[id number] DOB: [birth year] DOA: 04/29/2021 ? ?PCP: Patient, No Pcp Per (Inactive) ? ?Admit date: 04/29/2021 ?Discharge date: 05/07/2021 ? ?Admitted From: SNF ?Disposition:  SNF ? ?Recommendations for Outpatient Follow-up:  ?Follow up with PCP in 1-2 weeks ?Urology will follow-up as an outpatient ?Follow-up with cardiology in 2 to 3 weeks ?Please obtain BMP/CBC in one week ?Follow-up with palliative care as an outpatient ? ?Home Health: none ?Equipment/Devices: none ? ?Discharge Condition: stable ?CODE STATUS: Full code ? ?Diet Orders (From admission, onward)  ? ?  Start     Ordered  ? 04/29/21 1017  DIET DYS 3 Room service appropriate? Yes; Fluid consistency: Thin  Diet effective now       ?Question Answer Comment  ?Room service appropriate? Yes   ?Fluid consistency: Thin   ?  ? 04/29/21 1016  ? ?  ?  ? ?  ? ? ? 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  Patient was noted to have serum creatinine of 11 and potassium was 7.5, bladder scan was done he had distended large bladder, Foley catheter was placed with more than 2 L of urine output ? ?Hospital Course / Discharge diagnoses: ?Principal Problem: ?  AKI (acute kidney injury) ([place]) ?Active Problems: ?  CVA, old, aphasia ?  Chronic systolic CHF (congestive heart failure) ([place]) ?  AF (paroxysmal atrial fibrillation) ([place]) ?  At risk for aspiration ?  Protein-calorie malnutrition, severe ?  HLD (hyperlipidemia) ?  Pleural effusion, right ?  Nausea and vomiting ?  Elevated LFTs ?  BPH (benign prostatic hyperplasia) ?  Hyperkalemia ?  Stage 3a chronic kidney disease (CKD) ([place]) ?  Bladder outlet obstruction ?  Bilateral hydronephrosis ? ? ?Principal problem ?Acute kidney injury on chronic kidney disease stage IIIa, due to bladder outlet obstruction-patient's  baseline creatinine between 1 and 1.4, came into the hospital with acute kidney injury and a creatinine of 11.4.  Nephrology consulted, and given degree of renal failure he was admitted to the ICU and placed on CRRT.  A Foley catheter was also placed.  Creatinine improving, now appears stabilized, likely he has a new baseline around 2.  His home Lasix was resumed, his creatinine has remained stable ?  ?Active problems ?Bladder outlet obstruction-likely in the setting of BPH, he was started on tamsulosin.  Foley catheter was removed 4/25 and he was given a voiding trial, however he failed and Foley catheter had to be reinserted on 4/26.  Continue for now, he will need to remain with Foley for the next couple of weeks and have urology outpatient follow-up.  I have contacted urology on-call on 4/27, spoke with Dr. Abner Greenspan, patient will have outpatient follow-up ?Paroxysmal A-fib-he is on amiodarone but not on anticoagulation.  His amiodarone was initially held due to elevated LFTs, it was resumed on 4/26 and LFTs have remained stable ?Chronic combined CHF-a 2D echo done April 2023 shows an EF of 20-25%, similar to his EF in 2019. At home he is on Entresto which needs to be held now due to his renal failure, he seems to be tolerating furosemide, continue  ?Large right-sided pleural effusion-underwent thoracentesis per PCCM, effusion was transudative.   ?Elevated LFTs-initial concern that these were due to amiodarone or statin.  LFTs improving, patient started on amiodarone and statin with continued improvement in his LFTs.  Continue on discharge.  ?Hyperkalemia, hypokalemia-on admission potassium was undetectably high above 7.5.  Underwent dialysis and his AKI was treated, now potassium has stabilized.  Continue to monitor in an outpatient setting  ?Concern for possible pneumonia-ruled out, no fever, no leukocytosis, Rocephin discontinued ?Nausea, vomiting-likely in the setting of renal failure, now resolved. ?Acute  metabolic encephalopathy-likely in the setting of renal failure, mental status improved ?History of CVA with residual left-sided hemiparesis, dysphagia, dysarthria-continue supportive measures, resume aspirin as well as Lipitor ?Hypoglycemia-initially thought to be due to poor p.o. intake and he was on dextrose infusion.  Once he started eating, dextrose was discontinued and had no further hypoglycemic episodes ?Goals of care-palliative care following as an outpatient, consulted here as well.  Ongoing. ? ?Sepsis ruled out ? ? ?Discharge Instructions ? ? ?Allergies as of 05/07/2021   ?No Known Allergies ?  ? ?  ?Medication List  ?  ? ?STOP taking these medications   ? ?acetaminophen 325 MG tablet ?Commonly known as: TYLENOL ?  ?sacubitril-valsartan 24-26 MG ?Commonly known as: ENTRESTO ?  ? ?  ? ?TAKE these medications   ? ?amiodarone 200 MG tablet ?Commonly known as: PACERONE ?Take 200 mg by mouth daily. ?  ?aspirin 81 MG chewable tablet ?Chew 81 mg by mouth daily. ?  ?atorvastatin 40 MG tablet ?Commonly known as: LIPITOR ?Take 40 mg by mouth at bedtime. ?  ?famotidine 20 MG tablet ?Commonly known as: PEPCID ?Take 20 mg by mouth daily. ?  ?ferrous sulfate 325 (65 FE) MG EC tablet ?Take 325 mg by mouth in the morning and at bedtime. ?  ?furosemide 20 MG tablet ?Commonly known as: LASIX ?Place 2 tablets (40 mg total) into feeding tube daily. ?  ?gabapentin 100 MG capsule ?Commonly known as: NEURONTIN ?Take 4 capsules (400 mg total) by mouth 2 (two) times daily. ?What changed: medication strength ?  ?magnesium oxide 400 MG tablet ?Commonly known as: MAG-OX ?Take 400 mg by mouth 2 (two) times daily. ?  ?nitroGLYCERIN 0.4 MG SL tablet ?Commonly known as: NITROSTAT ?Place under the tongue. ?  ?OVER THE COUNTER MEDICATION ?See admin instructions. "House supplement two times a day for caloric/protein support related to moderate protein calorie malnutrition 120 ml" ?  ?polyethylene glycol 17 g packet ?Commonly known as:  MIRALAX / GLYCOLAX ?Take 17 g by mouth daily. ?  ?saccharomyces boulardii 250 MG capsule ?Commonly known as: FLORASTOR ?Take 250 mg by mouth daily. For 20 days starting 04/10/21-04/29/21 ?  ?senna 8.6 MG tablet ?Commonly known as: SENOKOT ?Take 1 tablet by mouth daily. ?  ?tamsulosin 0.4 MG Caps capsule ?Commonly known as: FLOMAX ?Take 1 capsule (0.4 mg total) by mouth daily. ?  ?vitamin C 500 MG tablet ?Commonly known as: ASCORBIC ACID ?Take 500 mg by mouth 2 (two) times daily. ?  ?Vitamin D-3 125 MCG (5000 UT) Tabs ?Take 1 tablet by mouth daily. ?  ? ?  ? ? ? ?Consultations: ?PCCM ?Nephrology  ? ?Procedures/Studies: ? ?CT ABDOMEN PELVIS WO CONTRAST ? ?Result Date: 04/29/2021 ?CLINICAL DATA:  Sepsis.  Acute renal failure. EXAM: CT ABDOMEN AND PELVIS WITHOUT CONTRAST TECHNIQUE: Multidetector CT imaging of the abdomen and pelvis was performed following the standard protocol without IV contrast. RADIATION DOSE REDUCTION: This exam was performed according to the departmental dose-optimization program which includes automated exposure control, adjustment of the mA and/or kV according to patient size and/or use of iterative reconstruction technique. COMPARISON:  CT abdomen 05/08/2020. CT abdomen pelvis 04/26/2020. FINDINGS: Lower chest: Moderate  size right and small left pleural effusions are present. There is airspace consolidation in the left lower lobe and atelectasis in the right lower lobe. Heart is enlarged, unchanged. Hepatobiliary: The liver is mildly diffusely hyperdense without focal lesion identified. Gallstones and sludge are present. No biliary ductal dilatation. Pancreas: Unremarkable. No pancreatic ductal dilatation or surrounding inflammatory changes. Spleen: Normal in size without focal abnormality. Adrenals/Urinary Tract: There is mild bilateral hydroureteronephrosis to the level of the bladder without obstructing calculus. The bladder is markedly distended. There is mild diffuse bladder wall thickening and  surrounding inflammation despite Foley catheter in the bladder. Multiple layering bladder calculi are visualized measuring up to 5 mm. Adrenal glands are within normal limits. There are numerous bilateral renal cys

## 2021-05-07 NOTE — Plan of Care (Signed)

## 2021-06-01 LAB — FUNGUS CULTURE WITH STAIN

## 2021-06-01 LAB — FUNGUS CULTURE RESULT

## 2021-06-01 LAB — FUNGAL ORGANISM REFLEX

## 2021-06-15 ENCOUNTER — Non-Acute Institutional Stay: Payer: Commercial Managed Care - HMO | Admitting: *Deleted

## 2021-06-15 ENCOUNTER — Non-Acute Institutional Stay: Payer: Commercial Managed Care - HMO | Admitting: Hospice

## 2021-06-15 DIAGNOSIS — Z515 Encounter for palliative care: Secondary | ICD-10-CM

## 2021-06-15 DIAGNOSIS — I69352 Hemiplegia and hemiparesis following cerebral infarction affecting left dominant side: Secondary | ICD-10-CM

## 2021-06-15 DIAGNOSIS — I5022 Chronic systolic (congestive) heart failure: Secondary | ICD-10-CM

## 2021-06-15 DIAGNOSIS — J189 Pneumonia, unspecified organism: Secondary | ICD-10-CM

## 2021-06-15 NOTE — Progress Notes (Signed)
[facility] Consult Note Telephone: [phone]  Fax: [phone]  PATIENT NAME: [name] [name] DOB: 1945/10/13 MRN: [id number]  PRIMARY CARE PROVIDER: Dr. Garwin Brothers  REFERRING PROVIDER: Dr. Garwin Brothers  RESPONSIBLE PARTY:  Self Contact Information     Name Relation Home Work [place] Son [phone]     [name],[name] Daughter [phone]     Climmie, [place]ow [phone]     Logon, [place]   [phone]      TELEHEALTH VISIT STATEMENT Due to the COVID-19 crisis, this visit was done via telemedicine from my office and it was initiated and consent by this patient and or family.  I connected with patient OR PROXY by a telephone/video  and verified that I am speaking with the correct person. I discussed the limitations of evaluation and management by telemedicine. Patient/proxy expressed understanding and agreed to proceed.  Monisha RN is in person with patient seen/facilitating video call. Palliative Care was asked to follow this patient to address advance care planning, complex medical decision making and goals of care clarification.   Visit is to build trust and highlight Palliative Medicine as specialized medical care for people living with serious illness, aimed at facilitating better quality of life through symptoms relief, assisting with advance care planning and complex medical decision making. This is a follow up visit.  RECOMMENDATIONS/PLAN:   Advance Care Planning/Code Status: Patient is DNR/DNI  Goals of Care: Goals of care include to maximize quality of life and symptom management.  Patient has declined significantly in functional status.  Collaborative discussion with facility NP who reports hospice order has been placed for patient.  Discussion to de-escalate disease focused aggressive treatments and focus on symptom management for patient's comfort. Hospice admission visit scheduled for next  week Palliative care team will continue to support patient, patient's family, and medical team.  Symptom management/Plan:  Pneumonia: Continue IV NS, Rocephin, Oxygen supplementation as ordered. Use breathing treatments - Albuterol as needed shortness of breath. Hemiplegia/hemiparesis: Worsened decline in functional status, bedbound.  Max assist for ADLs, feeds self after set up.  Continue ongoing supportive care. CHF: Continue Lasix as ordered.  Constipation: Continue MiraLAX and senna S daily.  No report of nausea or vomiting.  Follow up: Palliative care will continue to follow for complex medical decision making, advance care planning, and clarification of goals. Return 6 weeks or prn. Encouraged to call provider sooner with any concerns.  CHIEF COMPLAINT: Palliative follow up  HISTORY OF PRESENT ILLNESS:  [name] [name] a 76 y.o. male with multiple morbidities requiring close monitoring/management with high risk of decompensation and morbidity: Failure to thrive, pneumonia, hemiplegia/hemiparesis following cerebral infarction ,  hypertension, CHF, CKD, dysphagia. History obtained from review of EMR, discussion with primary team, family and/or patient. Records reviewed and summarized above. All 10 point systems reviewed and are negative except as documented in history of present illness above  Review and summarization of Epic records shows history from other than patient.   Palliative Care was asked to follow this patient o help address complex decision making in the context of advance care planning and goals of care clarification.   PERTINENT MEDICATIONS:  Outpatient Encounter Medications as of 06/15/2021  Medication Sig   amiodarone (PACERONE) 200 MG tablet Take 200 mg by mouth daily.    aspirin 81 MG chewable tablet Chew 81 mg by mouth daily.    atorvastatin (LIPITOR) 40 MG tablet Take 40 mg by mouth at bedtime.  Cholecalciferol (VITAMIN D-3) 125 MCG (5000 UT) TABS Take 1 tablet by  mouth daily.   famotidine (PEPCID) 20 MG tablet Take 20 mg by mouth daily.   ferrous sulfate 325 (65 FE) MG EC tablet Take 325 mg by mouth in the morning and at bedtime.   furosemide (LASIX) 20 MG tablet Place 2 tablets (40 mg total) into feeding tube daily.   gabapentin (NEURONTIN) 100 MG capsule Take 4 capsules (400 mg total) by mouth 2 (two) times daily.   magnesium oxide (MAG-OX) 400 MG tablet Take 400 mg by mouth 2 (two) times daily.    nitroGLYCERIN (NITROSTAT) 0.4 MG SL tablet Place under the tongue.   OVER THE COUNTER MEDICATION See admin instructions. "House supplement two times a day for caloric/protein support related to moderate protein calorie malnutrition 120 ml"   polyethylene glycol (MIRALAX / GLYCOLAX) 17 g packet Take 17 g by mouth daily.   saccharomyces boulardii (FLORASTOR) 250 MG capsule Take 250 mg by mouth daily. For 20 days starting 04/10/21-04/29/21   senna (SENOKOT) 8.6 MG tablet Take 1 tablet by mouth daily.   tamsulosin (FLOMAX) 0.4 MG CAPS capsule Take 1 capsule (0.4 mg total) by mouth daily.   vitamin C (ASCORBIC ACID) 500 MG tablet Take 500 mg by mouth 2 (two) times daily.    No facility-administered encounter medications on file as of 06/15/2021.    HOSPICE ELIGIBILITY/DIAGNOSIS: TBD  PAST MEDICAL HISTORY:  Past Medical History:  Diagnosis Date   Chronic systolic CHF (congestive heart failure) ([facility])    CVA, old, aphasia    Diabetes ([place])    Hemiplegia ([place])    HTN (hypertension)    Intestinal obstruction ([place]) 11/2015   Peripheral vascular disease ([place])    Pneumonia    Stroke ([place])      ALLERGIES: No Known Allergies    Thank you for the opportunity to participate in the care of [name] [name] Please call our office at [phone] if we can be of additional assistance.  Note: Portions of this note were generated with [employer]. Dictation errors may occur despite best attempts at proofreading.  Teodoro Spray, NP

## 2021-06-15 NOTE — Progress Notes (Signed)
[facility] RN NOTE  PATIENT NAME: [name] [name] DOB: December 06, 1945 MRN: [id number]  PRIMARY CARE PROVIDER: Patient, No Pcp Per (Inactive)  RESPONSIBLE PARTY: Oz Gammel (son) Acct ID - Guarantor Home Phone Work Phone Relationship Acct Type  [id number] MARKEY, DEADY* [id number]  Self P/F     [address], [place], [place] [address]    RN face to face visit completed with patient in his room at the facility [facility]). Conferenced in [facility] NP via video. See NP note for complete visit details.  (Duration of visit and documentation 30 minutes)   Daryl Eastern, RN BSN

## 2021-07-10 DEATH — deceased

## 2023-12-16 IMAGING — DX DG ABDOMEN 1V
1 series · 2 of 2 positions shown · non-contrast
Comparison: None.

CLINICAL DATA: Abdominal pain, shortness of breath, history of CVA.

EXAM:
ABDOMEN - 1 VIEW

[Series 1: abdomen · 0.14mm/px · 2 of 2 slices shown]
[im 1/2]
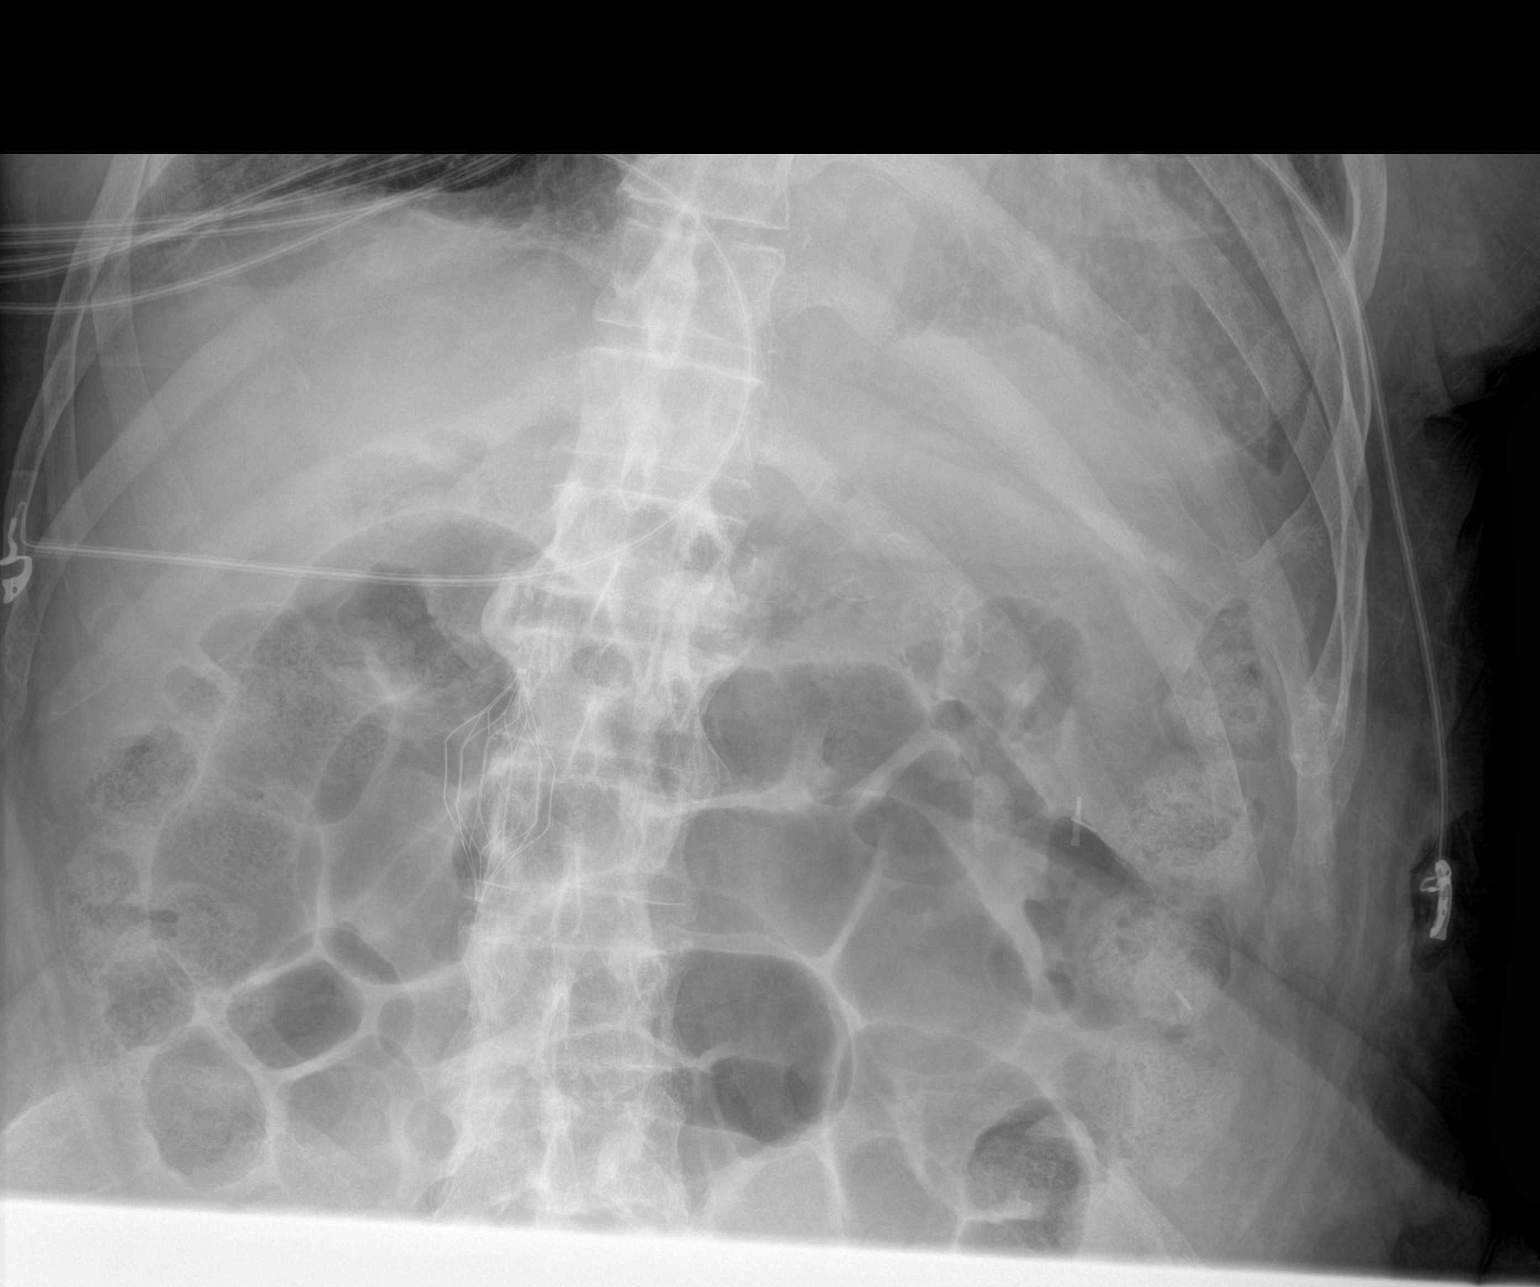
[im 2/2]
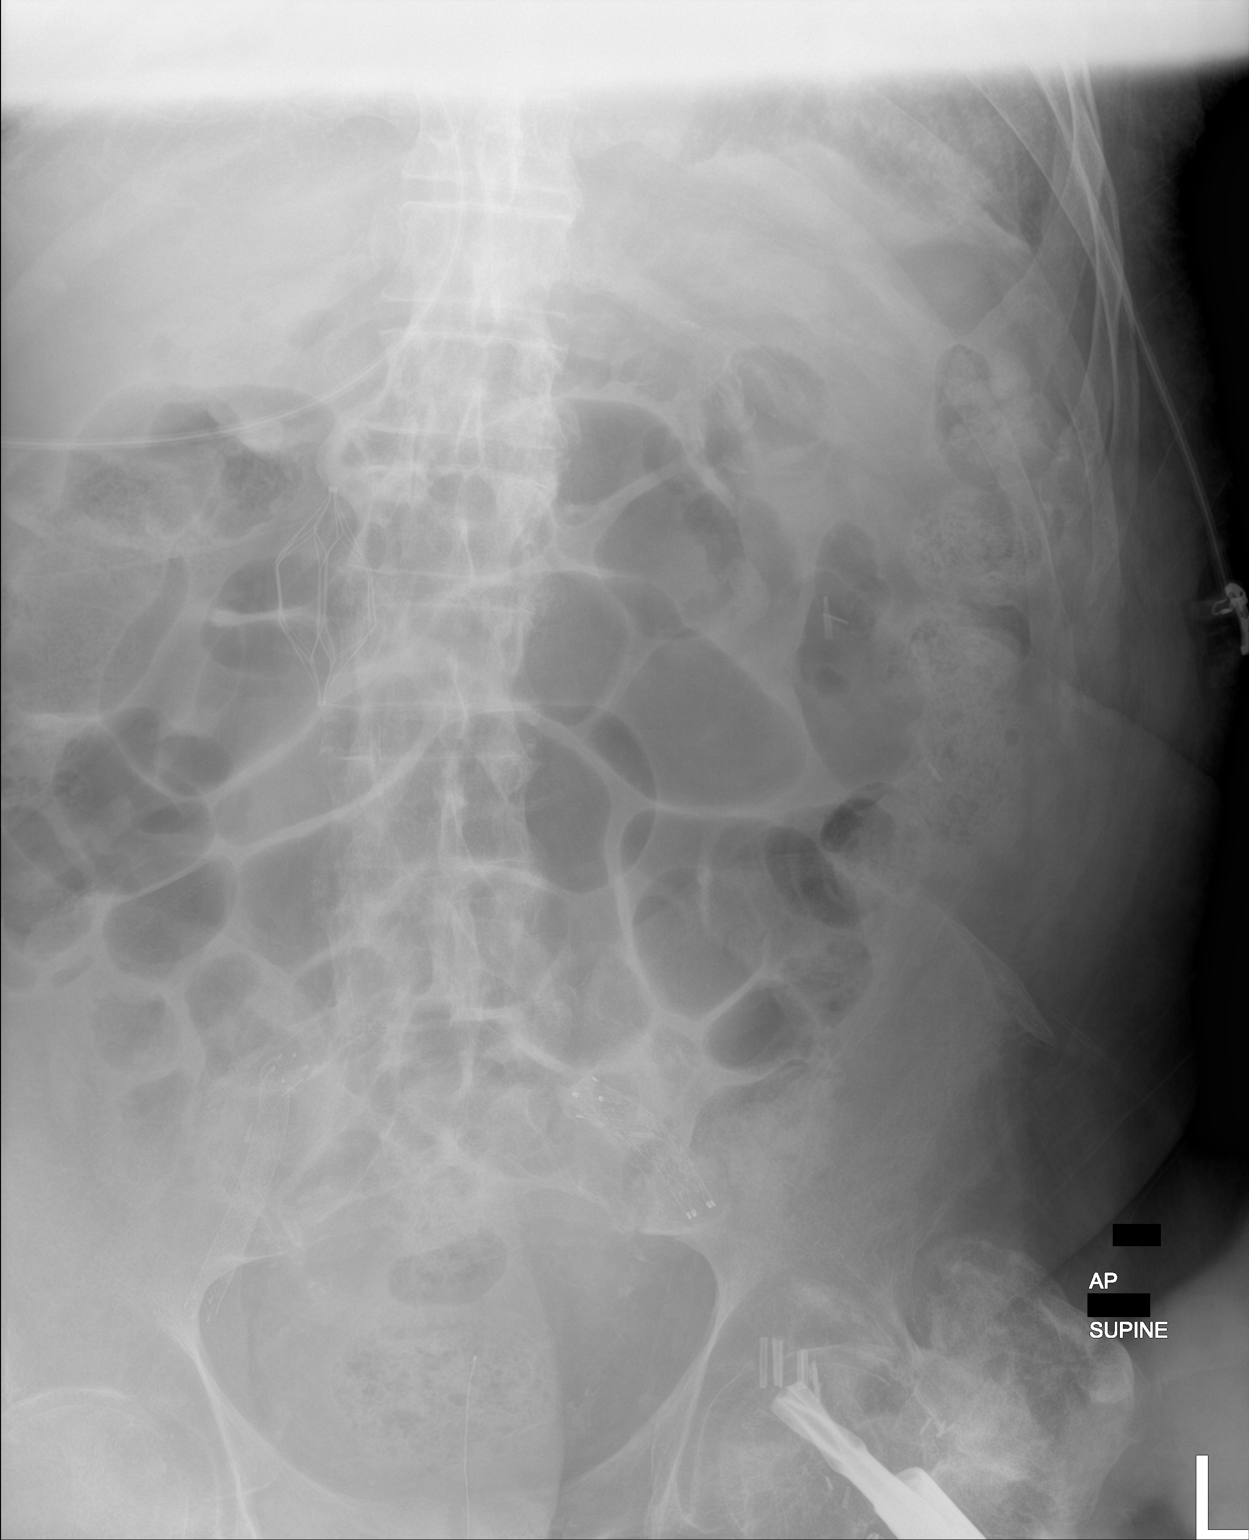

[2 of 2 positions shown; findings below may reference images not displayed]

FINDINGS: There are multiple dilated small bowel loops in midabdomen measuring
up to 4.4 cm concerning for ileus/obstruction. IVC filter is noted.
Bilateral iliac vascular stents. Partially imaged left hip
cephalomedullary nail.
IMPRESSION: Multiple dilated small bowel loops in mid abdomen measuring up to
4.3 cm concerning for ileus/obstruction.

## 2023-12-16 IMAGING — DX DG CHEST 1V PORT
1 series · 1 of 1 positions shown · non-contrast
Comparison: 04/30/2021

CLINICAL DATA: Shortness of breath

EXAM:
PORTABLE CHEST 1 VIEW

[chest]
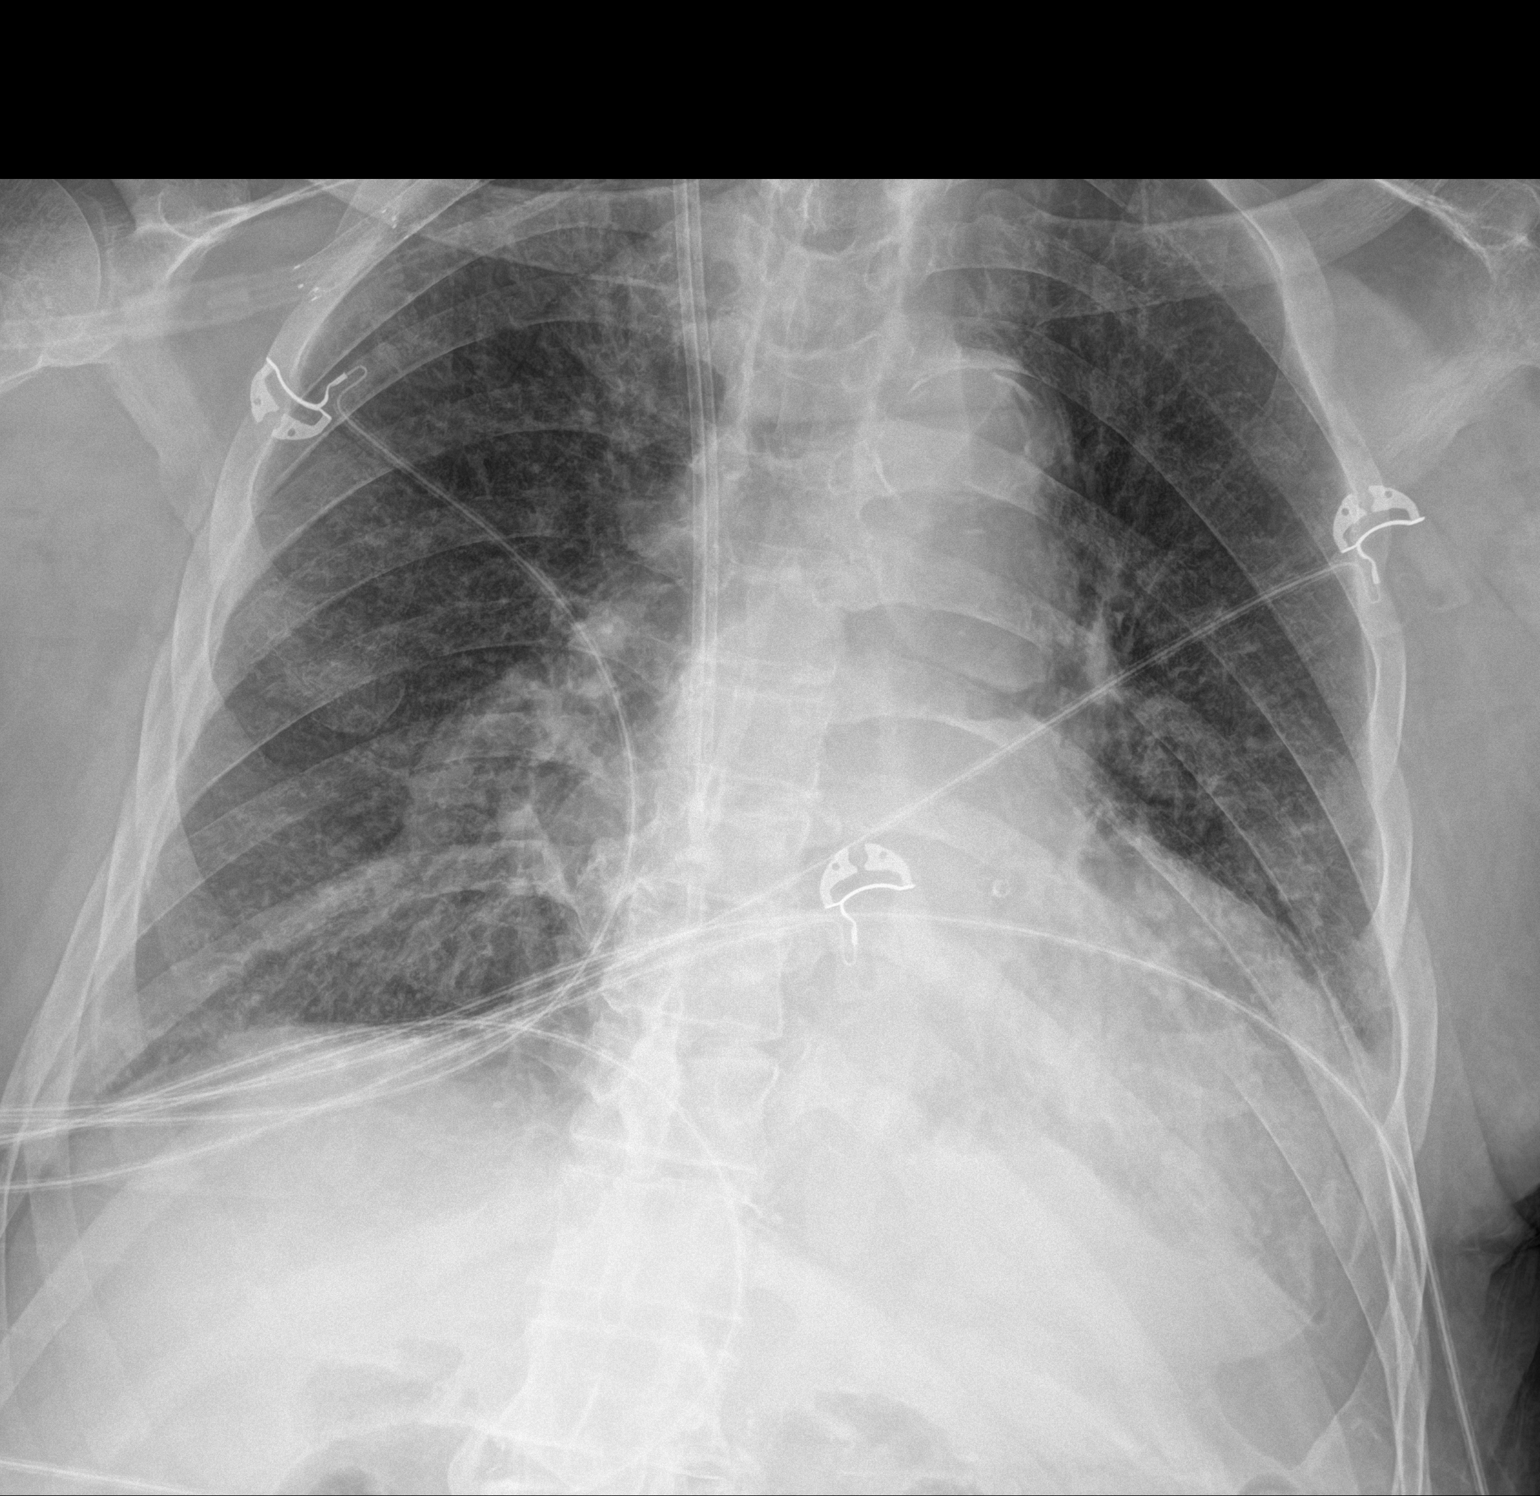

[1 of 1 positions shown; findings below may reference images not displayed]

FINDINGS: No significant change in AP portable chest radiograph. Cardiomegaly
with right neck multi lumen vascular catheter. Mild, diffuse
bilateral interstitial pulmonary opacity. Right subclavian vascular
stent.
IMPRESSION: Cardiomegaly with unchanged mild, diffuse bilateral interstitial
pulmonary opacity, likely edema. No new or focal airspace opacity.
